# Patient Record
Sex: Female | Born: 1940
Health system: Southern US, Community
[De-identification: ages and names within clinical notes are randomized; demographics above are authoritative.]

## PROBLEM LIST (undated history)

## (undated) DIAGNOSIS — E119 Type 2 diabetes mellitus without complications: Secondary | ICD-10-CM

## (undated) DIAGNOSIS — I10 Essential (primary) hypertension: Secondary | ICD-10-CM

## (undated) DIAGNOSIS — N183 Chronic kidney disease, stage 3 unspecified: Secondary | ICD-10-CM

## (undated) DIAGNOSIS — R51 Headache: Secondary | ICD-10-CM

## (undated) DIAGNOSIS — G4730 Sleep apnea, unspecified: Secondary | ICD-10-CM

## (undated) DIAGNOSIS — Z8489 Family history of other specified conditions: Secondary | ICD-10-CM

## (undated) DIAGNOSIS — R519 Headache, unspecified: Secondary | ICD-10-CM

## (undated) HISTORY — PX: CLOSED REDUCTION SHOULDER DISLOCATION: SUR242

## (undated) HISTORY — PX: MULTIPLE TOOTH EXTRACTIONS: SHX2053

## (undated) HISTORY — PX: EYE SURGERY: SHX253

---

## 2006-06-05 ENCOUNTER — Other Ambulatory Visit: Admission: RE | Admit: 2006-06-05 | Discharge: 2006-06-05 | Payer: Self-pay | Admitting: Family Medicine

## 2011-12-12 DIAGNOSIS — E7800 Pure hypercholesterolemia, unspecified: Secondary | ICD-10-CM | POA: Diagnosis not present

## 2012-02-29 DIAGNOSIS — I10 Essential (primary) hypertension: Secondary | ICD-10-CM | POA: Diagnosis not present

## 2012-02-29 DIAGNOSIS — E1129 Type 2 diabetes mellitus with other diabetic kidney complication: Secondary | ICD-10-CM | POA: Diagnosis not present

## 2012-02-29 DIAGNOSIS — Z1331 Encounter for screening for depression: Secondary | ICD-10-CM | POA: Diagnosis not present

## 2012-02-29 DIAGNOSIS — E7800 Pure hypercholesterolemia, unspecified: Secondary | ICD-10-CM | POA: Diagnosis not present

## 2012-10-16 DIAGNOSIS — I10 Essential (primary) hypertension: Secondary | ICD-10-CM | POA: Diagnosis not present

## 2012-10-16 DIAGNOSIS — E7800 Pure hypercholesterolemia, unspecified: Secondary | ICD-10-CM | POA: Diagnosis not present

## 2012-10-16 DIAGNOSIS — E1129 Type 2 diabetes mellitus with other diabetic kidney complication: Secondary | ICD-10-CM | POA: Diagnosis not present

## 2013-04-09 DIAGNOSIS — E1129 Type 2 diabetes mellitus with other diabetic kidney complication: Secondary | ICD-10-CM | POA: Diagnosis not present

## 2013-04-09 DIAGNOSIS — E7800 Pure hypercholesterolemia, unspecified: Secondary | ICD-10-CM | POA: Diagnosis not present

## 2013-04-09 DIAGNOSIS — Z1331 Encounter for screening for depression: Secondary | ICD-10-CM | POA: Diagnosis not present

## 2013-04-09 DIAGNOSIS — I10 Essential (primary) hypertension: Secondary | ICD-10-CM | POA: Diagnosis not present

## 2013-10-07 DIAGNOSIS — H18419 Arcus senilis, unspecified eye: Secondary | ICD-10-CM | POA: Diagnosis not present

## 2013-10-07 DIAGNOSIS — H25019 Cortical age-related cataract, unspecified eye: Secondary | ICD-10-CM | POA: Diagnosis not present

## 2013-10-07 DIAGNOSIS — H2510 Age-related nuclear cataract, unspecified eye: Secondary | ICD-10-CM | POA: Diagnosis not present

## 2013-10-07 DIAGNOSIS — H02839 Dermatochalasis of unspecified eye, unspecified eyelid: Secondary | ICD-10-CM | POA: Diagnosis not present

## 2013-10-07 DIAGNOSIS — E119 Type 2 diabetes mellitus without complications: Secondary | ICD-10-CM | POA: Diagnosis not present

## 2013-10-07 DIAGNOSIS — H25049 Posterior subcapsular polar age-related cataract, unspecified eye: Secondary | ICD-10-CM | POA: Diagnosis not present

## 2013-11-13 DIAGNOSIS — E1129 Type 2 diabetes mellitus with other diabetic kidney complication: Secondary | ICD-10-CM | POA: Diagnosis not present

## 2013-11-13 DIAGNOSIS — H269 Unspecified cataract: Secondary | ICD-10-CM | POA: Diagnosis not present

## 2013-11-13 DIAGNOSIS — I10 Essential (primary) hypertension: Secondary | ICD-10-CM | POA: Diagnosis not present

## 2013-11-13 DIAGNOSIS — E7800 Pure hypercholesterolemia, unspecified: Secondary | ICD-10-CM | POA: Diagnosis not present

## 2013-12-09 DIAGNOSIS — H269 Unspecified cataract: Secondary | ICD-10-CM | POA: Diagnosis not present

## 2013-12-09 DIAGNOSIS — Z961 Presence of intraocular lens: Secondary | ICD-10-CM | POA: Diagnosis not present

## 2013-12-09 DIAGNOSIS — H2510 Age-related nuclear cataract, unspecified eye: Secondary | ICD-10-CM | POA: Diagnosis not present

## 2013-12-10 DIAGNOSIS — H2510 Age-related nuclear cataract, unspecified eye: Secondary | ICD-10-CM | POA: Diagnosis not present

## 2013-12-23 DIAGNOSIS — H2510 Age-related nuclear cataract, unspecified eye: Secondary | ICD-10-CM | POA: Diagnosis not present

## 2013-12-23 DIAGNOSIS — H269 Unspecified cataract: Secondary | ICD-10-CM | POA: Diagnosis not present

## 2013-12-23 DIAGNOSIS — H52229 Regular astigmatism, unspecified eye: Secondary | ICD-10-CM | POA: Diagnosis not present

## 2013-12-23 DIAGNOSIS — H5210 Myopia, unspecified eye: Secondary | ICD-10-CM | POA: Diagnosis not present

## 2013-12-23 DIAGNOSIS — Z961 Presence of intraocular lens: Secondary | ICD-10-CM | POA: Diagnosis not present

## 2014-02-17 DIAGNOSIS — H20029 Recurrent acute iridocyclitis, unspecified eye: Secondary | ICD-10-CM | POA: Diagnosis not present

## 2014-03-07 DIAGNOSIS — E7800 Pure hypercholesterolemia, unspecified: Secondary | ICD-10-CM | POA: Diagnosis not present

## 2014-05-12 DIAGNOSIS — H02059 Trichiasis without entropian unspecified eye, unspecified eyelid: Secondary | ICD-10-CM | POA: Diagnosis not present

## 2014-05-14 DIAGNOSIS — Z1331 Encounter for screening for depression: Secondary | ICD-10-CM | POA: Diagnosis not present

## 2014-05-14 DIAGNOSIS — N183 Chronic kidney disease, stage 3 unspecified: Secondary | ICD-10-CM | POA: Diagnosis not present

## 2014-05-14 DIAGNOSIS — E1129 Type 2 diabetes mellitus with other diabetic kidney complication: Secondary | ICD-10-CM | POA: Diagnosis not present

## 2014-05-14 DIAGNOSIS — I129 Hypertensive chronic kidney disease with stage 1 through stage 4 chronic kidney disease, or unspecified chronic kidney disease: Secondary | ICD-10-CM | POA: Diagnosis not present

## 2014-05-14 DIAGNOSIS — E7800 Pure hypercholesterolemia, unspecified: Secondary | ICD-10-CM | POA: Diagnosis not present

## 2014-11-14 DIAGNOSIS — I129 Hypertensive chronic kidney disease with stage 1 through stage 4 chronic kidney disease, or unspecified chronic kidney disease: Secondary | ICD-10-CM | POA: Diagnosis not present

## 2014-11-14 DIAGNOSIS — E780 Pure hypercholesterolemia: Secondary | ICD-10-CM | POA: Diagnosis not present

## 2014-11-14 DIAGNOSIS — N183 Chronic kidney disease, stage 3 (moderate): Secondary | ICD-10-CM | POA: Diagnosis not present

## 2014-11-14 DIAGNOSIS — E1121 Type 2 diabetes mellitus with diabetic nephropathy: Secondary | ICD-10-CM | POA: Diagnosis not present

## 2015-05-18 DIAGNOSIS — I10 Essential (primary) hypertension: Secondary | ICD-10-CM | POA: Diagnosis not present

## 2015-05-18 DIAGNOSIS — E1165 Type 2 diabetes mellitus with hyperglycemia: Secondary | ICD-10-CM | POA: Diagnosis not present

## 2015-05-18 DIAGNOSIS — Z1389 Encounter for screening for other disorder: Secondary | ICD-10-CM | POA: Diagnosis not present

## 2015-05-18 DIAGNOSIS — N183 Chronic kidney disease, stage 3 (moderate): Secondary | ICD-10-CM | POA: Diagnosis not present

## 2015-05-18 DIAGNOSIS — E1121 Type 2 diabetes mellitus with diabetic nephropathy: Secondary | ICD-10-CM | POA: Diagnosis not present

## 2015-05-18 DIAGNOSIS — E780 Pure hypercholesterolemia: Secondary | ICD-10-CM | POA: Diagnosis not present

## 2015-06-30 DIAGNOSIS — M79604 Pain in right leg: Secondary | ICD-10-CM | POA: Diagnosis not present

## 2015-08-18 DIAGNOSIS — N183 Chronic kidney disease, stage 3 (moderate): Secondary | ICD-10-CM | POA: Diagnosis not present

## 2015-08-18 DIAGNOSIS — E1121 Type 2 diabetes mellitus with diabetic nephropathy: Secondary | ICD-10-CM | POA: Diagnosis not present

## 2015-08-18 DIAGNOSIS — E780 Pure hypercholesterolemia: Secondary | ICD-10-CM | POA: Diagnosis not present

## 2015-08-18 DIAGNOSIS — I10 Essential (primary) hypertension: Secondary | ICD-10-CM | POA: Diagnosis not present

## 2015-11-17 ENCOUNTER — Emergency Department (HOSPITAL_COMMUNITY): Payer: Medicare Other

## 2015-11-17 ENCOUNTER — Ambulatory Visit
Admission: RE | Admit: 2015-11-17 | Discharge: 2015-11-17 | Disposition: A | Discharge status: Home / Self Care | Payer: Medicare Other | Source: Ambulatory Visit | Attending: Family Medicine | Admitting: Family Medicine

## 2015-11-17 ENCOUNTER — Encounter (HOSPITAL_COMMUNITY): Payer: Self-pay | Admitting: Emergency Medicine

## 2015-11-17 ENCOUNTER — Other Ambulatory Visit: Payer: Self-pay | Admitting: Family Medicine

## 2015-11-17 ENCOUNTER — Emergency Department (HOSPITAL_COMMUNITY)
Admission: EM | Admit: 2015-11-17 | Discharge: 2015-11-17 | Disposition: A | Discharge status: Home / Self Care | Payer: Medicare Other | Attending: Emergency Medicine | Admitting: Emergency Medicine

## 2015-11-17 DIAGNOSIS — M79601 Pain in right arm: Secondary | ICD-10-CM

## 2015-11-17 DIAGNOSIS — Z79899 Other long term (current) drug therapy: Secondary | ICD-10-CM | POA: Diagnosis not present

## 2015-11-17 DIAGNOSIS — Y92520 Airport as the place of occurrence of the external cause: Secondary | ICD-10-CM | POA: Insufficient documentation

## 2015-11-17 DIAGNOSIS — W010XXA Fall on same level from slipping, tripping and stumbling without subsequent striking against object, initial encounter: Secondary | ICD-10-CM | POA: Insufficient documentation

## 2015-11-17 DIAGNOSIS — S43004A Unspecified dislocation of right shoulder joint, initial encounter: Secondary | ICD-10-CM | POA: Diagnosis not present

## 2015-11-17 DIAGNOSIS — I10 Essential (primary) hypertension: Secondary | ICD-10-CM | POA: Diagnosis not present

## 2015-11-17 DIAGNOSIS — Y999 Unspecified external cause status: Secondary | ICD-10-CM | POA: Insufficient documentation

## 2015-11-17 DIAGNOSIS — S4991XA Unspecified injury of right shoulder and upper arm, initial encounter: Secondary | ICD-10-CM | POA: Diagnosis present

## 2015-11-17 DIAGNOSIS — E119 Type 2 diabetes mellitus without complications: Secondary | ICD-10-CM | POA: Diagnosis not present

## 2015-11-17 DIAGNOSIS — Z7982 Long term (current) use of aspirin: Secondary | ICD-10-CM | POA: Insufficient documentation

## 2015-11-17 DIAGNOSIS — Y9389 Activity, other specified: Secondary | ICD-10-CM | POA: Diagnosis not present

## 2015-11-17 HISTORY — DX: Type 2 diabetes mellitus without complications: E11.9

## 2015-11-17 HISTORY — DX: Essential (primary) hypertension: I10

## 2015-11-17 MED ORDER — PROPOFOL 10 MG/ML IV BOLUS
INTRAVENOUS | Status: AC | PRN
  Administered 2015-11-17: 50 mg via INTRAVENOUS

## 2015-11-17 MED ORDER — HYDROCODONE-ACETAMINOPHEN 5-325 MG PO TABS: 2.0000 | ORAL_TABLET | ORAL | Status: DC | PRN

## 2015-11-17 MED ORDER — PROPOFOL 10 MG/ML IV BOLUS
2.0000 mg/kg | Freq: Once | INTRAVENOUS | Status: DC
Start: 2015-11-17 — End: 2015-11-18
  Filled 2015-11-17: qty 20

## 2015-11-17 MED ORDER — PROPOFOL 10 MG/ML IV BOLUS: 2.0000 mg/kg | Freq: Once | INTRAVENOUS | Status: DC

## 2015-11-17 NOTE — ED Notes (Signed)
Pt with fall today and right shoulder dislocation per xray she had today; CMS intact

## 2015-11-17 NOTE — ED Provider Notes (Signed)
CSN: [id number]     Arrival date & time 11/17/15  1713 History   First MD Initiated Contact with Patient 11/17/15 1750     Chief Complaint  Patient presents with  . Shoulder Injury     (Consider location/radiation/quality/duration/timing/severity/associated sxs/prior Treatment) Patient is a 74 y.o. female presenting with shoulder pain. The history is provided by the patient.  Shoulder Pain Location:  Shoulder Injury: yes   Mechanism of injury: fall   Fall:    Fall occurred:  Walking and standing   Impact surface:  Hard floor   Point of impact:  Outstretched arms   Entrapped after fall: no   Shoulder location:  R shoulder Pain details:    Quality:  Aching   Radiates to:  Does not radiate   Severity:  Severe   Onset quality:  Gradual   Timing:  Constant   Progression:  Unchanged Chronicity:  New Handedness:  Right-handed Dislocation: yes   Prior injury to area:  No Worsened by:  Movement and bearing weight Ineffective treatments:  None tried Associated symptoms: decreased range of motion   Associated symptoms: no back pain, no fatigue, no fever, no muscle weakness, no neck pain, no numbness, no stiffness, no swelling and no tingling   Risk factors: no concern for non-accidental trauma and no recent illness     Past Medical History  Diagnosis Date  . Hypertension   . Diabetes mellitus without complication (HCC)    History reviewed. No pertinent past surgical history. History reviewed. No pertinent family history. Social History  Substance Use Topics  . Smoking status: Never Smoker   . Smokeless tobacco: None  . Alcohol Use: No   OB History    No data available     Review of Systems  Constitutional: Negative for fever, appetite change and fatigue.  HENT: Negative for facial swelling.   Eyes: Negative for pain.  Respiratory: Negative for chest tightness and shortness of breath.   Gastrointestinal: Negative for nausea and vomiting.  Genitourinary: Negative for  dysuria.  Musculoskeletal: Positive for arthralgias. Negative for myalgias, back pain, stiffness, neck pain and neck stiffness.  Skin: Negative for rash and wound.  Neurological: Negative for dizziness, tremors, seizures, syncope, facial asymmetry, speech difficulty, weakness, numbness and headaches.      Allergies  Review of patient's allergies indicates no known allergies.  Home Medications   Prior to Admission medications   Medication Sig Start Date End Date Taking? Authorizing Provider  amLODipine (NORVASC) 10 MG tablet Take 10 mg by mouth daily. 09/21/15  Yes Historical Provider, MD  aspirin EC 81 MG tablet Take 81 mg by mouth every other day.   Yes Historical Provider, MD  atorvastatin (LIPITOR) 20 MG tablet Take 20 mg by mouth daily. 10/26/15  Yes Historical Provider, MD  metFORMIN (GLUCOPHAGE) 500 MG tablet Take 500 mg by mouth daily. 08/18/15  Yes Historical Provider, MD  olmesartan-hydrochlorothiazide (BENICAR HCT) 40-25 MG tablet Take 1 tablet by mouth daily. 10/26/15  Yes Historical Provider, MD  HYDROcodone-acetaminophen (NORCO/VICODIN) 5-325 MG tablet Take 2 tablets by mouth every 4 (four) hours as needed. 11/17/15   Stacy Gardner, MD   BP 143/68 mmHg  Pulse 70  Temp(Src) 97.9 F (36.6 C) (Oral)  Resp 16  SpO2 100% Physical Exam  Constitutional: She is oriented to person, place, and time. She appears well-developed and well-nourished.  HENT:  Head: Normocephalic and atraumatic.  Eyes: Conjunctivae and EOM are normal. Pupils are equal, round, and reactive to light.  Neck: Normal range of motion. Neck supple.  Cardiovascular: Normal rate, regular rhythm and normal heart sounds.  Exam reveals no gallop and no friction rub.   No murmur heard. Pulmonary/Chest: Effort normal and breath sounds normal. No respiratory distress. She has no wheezes. She has no rales. She exhibits no tenderness.  Abdominal: Bowel sounds are normal. She exhibits no distension. There is no  tenderness. There is no rebound.  Musculoskeletal:       Right shoulder: She exhibits decreased range of motion, tenderness, bony tenderness, deformity and pain. She exhibits no swelling and no effusion.       Right upper arm: She exhibits no tenderness, no bony tenderness, no swelling, no edema and no deformity.  Neurological: She is alert and oriented to person, place, and time. No cranial nerve deficit or sensory deficit. GCS eye subscore is 4. GCS verbal subscore is 5. GCS motor subscore is 6.  Skin: Skin is warm and dry.    ED Course  .Sedation Date/Time: 11/17/2015 8:57 PM Performed by: Stacy GardnerSEYMORE, Darious Rehman Authorized by: Stacy GardnerSEYMORE, Pierra Skora  Consent:    Consent obtained:  Written   Consent given by:  Patient   Risks discussed:  Allergic reaction, inadequate sedation, prolonged hypoxia resulting in organ damage, prolonged sedation necessitating reversal, respiratory compromise necessitating ventilatory assistance and intubation, vomiting, nausea and dysrhythmia   Alternatives discussed:  Analgesia without sedation and regional anesthesia Indications:    Sedation purpose:  Dislocation reduction   Procedure necessitating sedation performed by:  Physician performing sedation   Intended level of sedation:  Moderate (conscious sedation) Pre-sedation assessment:    Time since last food or drink:  Yesterday   ASA classification: class 2 - patient with mild systemic disease     Neck mobility: normal     Mouth opening:  2 finger widths   Thyromental distance:  2 finger widths   Mallampati score:  II - soft palate, uvula, fauces visible   Pre-sedation assessments completed and reviewed: airway patency, cardiovascular function, hydration status, mental status, nausea/vomiting, pain level, respiratory function and temperature   Immediate pre-procedure details:    Reassessment: Patient reassessed immediately prior to procedure     Reviewed: vital signs and NPO status     Verified: bag valve mask  available, emergency equipment available, IV patency confirmed, oxygen available and suction available   Procedure details (see MAR for exact dosages):    Preoxygenation:  Nasal cannula   Sedation:  Propofol   Intra-procedure monitoring:  Blood pressure monitoring, cardiac monitor, frequent vital sign checks and continuous pulse oximetry   Intra-procedure events: none     Intra-procedure management:  Supplemental oxygen Post-procedure details:    Attendance: Constant attendance by certified staff until patient recovered     Recovery: Patient returned to pre-procedure baseline     Post-sedation assessments completed and reviewed: airway patency, cardiovascular function, hydration status, mental status, nausea/vomiting, pain level and respiratory function     Patient is stable for discharge or admission: Yes     Patient tolerance:  Tolerated well, no immediate complications Reduction of dislocation Date/Time: 11/17/2015 9:00 PM Performed by: Stacy GardnerSEYMORE, Qiara Minetti Authorized by: Stacy GardnerSEYMORE, Jhalil Silvera Consent: Verbal consent obtained. Risks and benefits: risks, benefits and alternatives were discussed Consent given by: patient Patient understanding: patient states understanding of the procedure being performed Patient consent: the patient's understanding of the procedure matches consent given Procedure consent: procedure consent matches procedure scheduled Relevant documents: relevant documents present and verified Test results: test results available and properly labeled  Site marked: the operative site was marked Imaging studies: imaging studies available Required items: required blood products, implants, devices, and special equipment available Patient identity confirmed: verbally with patient and arm band Time out: Immediately prior to procedure a "time out" was called to verify the correct patient, procedure, equipment, support staff and site/side marked as required. Patient sedated: yes Sedation  type: moderate (conscious) sedation Sedatives: propofol Vitals: Vital signs were monitored during sedation. Patient tolerance: Patient tolerated the procedure well with no immediate complications   (including critical care time) Labs Review Labs Reviewed - No data to display  Imaging Review Dg Shoulder Right  11/17/2015  CLINICAL DATA:  Recent fall this morning with arm pain and decreased range of motion, initial encounter EXAM: RIGHT SHOULDER - 2+ VIEW COMPARISON:  None. FINDINGS: There is an anterior inferior dislocation of the right humeral head with respect to the glenoid labrum. No definitive fracture is seen at this time. IMPRESSION: Dislocation of the right shoulder. Electronically Signed   By: Alcide Clever M.D.   On: 11/17/2015 15:55   Dg Shoulder Right Port  11/17/2015  CLINICAL DATA:  Reduction of shoulder dislocation. EXAM: PORTABLE RIGHT SHOULDER - 2+ VIEW COMPARISON:  Earlier films, same date. FINDINGS: The humeral head is relocated in the glenoid fossa. No fractures are identified. The right lung is clear. IMPRESSION: Reduction of right shoulder dislocation. Electronically Signed   By: Rudie Meyer M.D.   On: 11/17/2015 20:06   Dg Humerus Right  11/17/2015  CLINICAL DATA:  74 year old female who fell at 0900 hours today with pain. Initial encounter. EXAM: RIGHT HUMERUS - 2+ VIEW COMPARISON:  Right shoulder series today reported separately. FINDINGS: Anterior, subcoracoid right glenohumeral joint dislocation again noted. There may be mild impaction of the right humeral head on the glenoid. The right humerus otherwise appears intact. Alignment at the right elbow is grossly maintained. Bone mineralization is within normal limits. Negative visualized right ribs and lung parenchyma. IMPRESSION: Right anterior glenohumeral joint dislocation re - identified. 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 Electronically Signed   By: Odessa Fleming M.D.   On: 11/17/2015 16:07    I have personally reviewed and evaluated these images and lab results as part of my medical decision-making.   EKG Interpretation None      MDM   Final diagnoses:  Shoulder dislocation, right, initial encounter     74 year old African-American female with past medical history of hypertension, diabetes presents in setting of right shoulder dislocation. She reports she was [place] from airport earlier today when she tripped and fell onto her right outstretched arm. She reports she had immediate discomfort in her shoulder for which she went to her primary care doctor. Primary care doctor had patient obtain x-rays which revealed right shoulder dislocation without fracture. Patient was advised to come to the emergency department for further evaluation.  On arrival patient was hemodynamically stable and continued to complain of right shoulder pain. Patient was neurovascularly intact in right upper extremity. I personally reviewed imaging which revealed right shoulder dislocation. Patient had shoulder dislocation reduction performed under conscious sedation as noted in procedure note above. Patient tolerated procedure well and continued to be neurovascularly intact on reexamination after reduction. Repeat x-ray demonstrated adequate reduction. Patient placed in sling.  Will discharge home with plan to follow up with orthopedics as outpatient within 1 week for reevaluation. Patient given short course of pain medication and strict return precautions. Patient stable at time  of discharge and in agreement with plan.  Attending has seen and evaluated patient and Dr. Denton Lank is in agreement with plan.    Stacy Gardner, MD 11/18/15 2536  Cathren Laine, MD 11/23/15 [phone]

## 2015-11-17 NOTE — Discharge Instructions (Signed)
Shoulder Dislocation Your shoulder joint is made up of 3 bones:  The upper arm bone (humerus).  The shoulder blade (scapula).  The collarbone (clavicle). A shoulder dislocation happens when your upper arm bone moves out of its normal place in your shoulder joint. HOME CARE If You Have a Splint or Sling:  Wear it as told by your doctor.  Take it off only as told by your doctor.  Loosen it if:  Your fingers become numb and tingly.  Your fingers turn cold and blue.  Keep it clean and dry. Bathing  Do not take baths, swim, or use a hot tub until your doctor says you can. Ask your doctor if you can take showers. You may only be allowed to take sponge baths.  If your doctor says taking baths or showers is okay, cover your splint or sling with a plastic bag. Do not let the splint or sling get wet. Managing Pain, Stiffness, and Swelling  If told, put ice on the injured area.  Put ice in a plastic bag.  Place a towel between your skin and the bag.  Leave the ice on for 20 minutes, 2-3 times per day.  Move your fingers often to avoid stiffness and to lessen swelling.  Raise (elevate) the injured area above the level of your heart while you are sitting or lying down. Driving  Do not drive while you are wearing a splint or sling on a hand that you use for driving.  Do not drive or operate heavy machinery while taking pain medicine. Activity  Return to your normal activities as told by your doctor. Ask your doctor what activities are safe for you.  Do range-of-motion exercises only as told by your doctor.  Exercise your hand by squeezing a soft ball. This keeps your hand and wrist from getting stiff and swollen. General Instructions  Take over-the-counter and prescription medicines only as told by your doctor.  Do not use any tobacco products, including cigarettes, chewing tobacco, or e-cigarettes. Tobacco can slow down healing. If you need help quitting, ask your  doctor.  Keep all follow-up visits as told by your doctor. This is important. GET HELP IF:  Your splint or sling gets damaged. GET HELP RIGHT AWAY IF:  Your pain gets worse instead of better.  You lose feeling in your arm or hand.  Your arm or hand turns white and cold.   This information is not intended to replace advice given to you by your health care provider. Make sure you discuss any questions you have with your health care provider.   Document Released: 02/06/2012 Document Revised: 08/05/2015 Document Reviewed: 03/09/2015 Elsevier Interactive Patient Education 2016 [employer].  How to Use a Shoulder Immobilizer A shoulder immobilizer is a device that you may have to wear after a shoulder injury or surgery. This device keeps your arm from moving. This prevents additional pain or injury. It also supports your arm next to your body as your shoulder heals. You may need to wear a shoulder immobilizer to treat a broken bone (fracture) in your shoulder. You may also need to wear one if you have an injury that moves your shoulder out of position (dislocation). There are different types of shoulder immobilizers. The one that you get depends on your injury. RISKS AND COMPLICATIONS Wearing a shoulder immobilizer in the wrong way can let your injured shoulder move around too much. This may delay healing and make your pain and swelling worse. HOW TO USE  YOUR SHOULDER IMMOBILIZER  The part of the immobilizer that goes around your neck (sling) should support your upper arm, with your elbow bent and your lower arm and hand across your chest.  Make sure that your elbow:  Is snug against the back pocket of the sling.  Does not move away from your body.  The strap of the immobilizer should go over your shoulder and support your arm and hand. Your hand should be slightly higher than your elbow. It should not hang loosely over the edge of the sling.  If the long strap has a pad, place it  where it is most comfortable on your neck.  Carefully follow your health care provider's instructions for wearing your shoulder immobilizer. Your health care provider may want you to:  Loosen your immobilizer to straighten your elbow and move your wrist and fingers. You may have to do this several times each day. Ask your health care provider when you should do this and how often.  Remove your immobilizer once every day to shower, but limit the movement in your injured arm. Before putting the immobilizer back on, use a towel to dry the area under your arm completely.  Remove your immobilizer to do shoulder exercises at home as directed by your health care provider.  Wear your immobilizer while you sleep. You may sleep more comfortably if you have your upper body raised on pillows. SEEK MEDICAL CARE IF:  Your immobilizer is not supporting your arm properly.  Your immobilizer gets damaged.  You have worsening pain or swelling in your shoulder, arm, or hand.  Your shoulder, arm, or hand changes color or temperature.  You lose feeling in your shoulder, arm, or hand.   This information is not intended to replace advice given to you by your health care provider. Make sure you discuss any questions you have with your health care provider.   Document Released: 12/22/2004 Document Revised: 03/31/2015 Document Reviewed: 10/22/2014 Elsevier Interactive Patient Education 2016 [employer] Inc[employer]  Shoulder Dislocation A shoulder dislocation happens when the upper arm bone (humerus) moves out of the shoulder joint. The shoulder joint is the part of the shoulder where the humerus, shoulder blade (scapula), and collarbone (clavicle) meet. CAUSES This condition is often caused by:  A fall.  A hit to the shoulder.  A forceful movement of the shoulder. RISK FACTORS This condition is more likely to develop in people who play sports. SYMPTOMS Symptoms of this condition include:  Deformity of the  shoulder.  Intense pain.  Inability to move the shoulder.  Numbness, weakness, or tingling in your neck or down your arm.  Bruising or swelling around your shoulder. DIAGNOSIS This condition is diagnosed with a physical exam. After the exam, tests may be done to check for related problems. Tests that may be done include:  X-ray. This may be done to check for broken bones.  MRI. This may be done to check for damage to the tissues around the shoulder.  Electromyogram. This may be done to check for nerve damage. TREATMENT This condition is treated with a procedure to place the humerus back in the joint. This procedure is called a reduction. There are two types of reduction:  Closed reduction. In this procedure, the humerus is placed back in the joint without surgery. The health care provider uses his or her hands to guide the bone back into place.  Open reduction. In this procedure, the humerus is placed back in the joint with surgery. An  open reduction may be recommended if:  You have a weak shoulder joint or weak ligaments.  You have had more than one shoulder dislocation.  The nerves or blood vessels around your shoulder have been damaged. After the humerus is placed back into the joint, your arm will be placed in a splint or sling to prevent it from moving. You will need to wear the splint or sling until your shoulder heals. When the splint or sling is removed, you may have physical therapy to help improve the range of motion in your shoulder joint. HOME CARE INSTRUCTIONS If You Have a Splint or Sling:  Wear it as told by your health care provider. Remove it only as told by your health care provider.  Loosen it if your fingers become numb and tingle, or if they turn cold and blue.  Keep it clean and dry. Bathing  Do not take baths, swim, or use a hot tub until your health care provider approves. Ask your health care provider if you can take showers. You may only be allowed to  take sponge baths for bathing.  If your health care provider approves bathing and showering, cover your splint or sling with a watertight plastic bag to protect it from water. Do not let the splint or sling get wet. Managing Pain, Stiffness, and Swelling  If directed, apply ice to the injured area.  Put ice in a plastic bag.  Place a towel between your skin and the bag.  Leave the ice on for 20 minutes, 2-3 times per day.  Move your fingers often to avoid stiffness and to decrease swelling.  Raise (elevate) the injured area above the level of your heart while you are sitting or lying down. Driving  Do not drive while wearing a splint or sling on a hand that you use for driving.  Do not drive or operate heavy machinery while taking pain medicine. Activity  Return to your normal activities as told by your health care provider. Ask your health care provider what activities are safe for you.  Perform range-of-motion exercises only as told by your health care provider.  Exercise your hand by squeezing a soft ball. This helps to decrease stiffness and swelling in your hand and wrist. General Instructions  Take over-the-counter and prescription medicines only as told by your health care provider.  Do not use any tobacco products, including cigarettes, chewing tobacco, or e-cigarettes. Tobacco can delay bone and tissue healing. If you need help quitting, ask your health care provider.  Keep all follow-up visits as told by your health care provider. This is important. SEEK MEDICAL CARE IF:  Your splint or sling gets damaged. SEEK IMMEDIATE MEDICAL CARE IF:  Your pain gets worse rather than better.  You lose feeling in your arm or hand.  Your arm or hand becomes white and cold.   This information is not intended to replace advice given to you by your health care provider. Make sure you discuss any questions you have with your health care provider.   Document Released: 08/09/2001  Document Revised: 08/05/2015 Document Reviewed: 03/09/2015 Elsevier Interactive Patient Education [employer]2016 Elsevier Inc.

## 2015-11-17 NOTE — ED Notes (Signed)
Patient verbalized understanding of discharge instructions and follow-up information and denies any further needs or questions at this time. VS stable. Ambulatory with steady gait. Assisted to ED entrance in wheelchair.

## 2015-11-26 DIAGNOSIS — S43014A Anterior dislocation of right humerus, initial encounter: Secondary | ICD-10-CM | POA: Diagnosis not present

## 2015-12-17 DIAGNOSIS — S43014S Anterior dislocation of right humerus, sequela: Secondary | ICD-10-CM | POA: Diagnosis not present

## 2015-12-18 ENCOUNTER — Other Ambulatory Visit: Payer: Self-pay | Admitting: Sports Medicine

## 2015-12-18 DIAGNOSIS — M25511 Pain in right shoulder: Secondary | ICD-10-CM

## 2016-01-04 ENCOUNTER — Ambulatory Visit
Admission: RE | Admit: 2016-01-04 | Discharge: 2016-01-04 | Disposition: A | Discharge status: Home / Self Care | Payer: Medicare Other | Source: Ambulatory Visit | Attending: Sports Medicine | Admitting: Sports Medicine

## 2016-01-04 DIAGNOSIS — M25511 Pain in right shoulder: Secondary | ICD-10-CM

## 2016-01-04 DIAGNOSIS — S46011A Strain of muscle(s) and tendon(s) of the rotator cuff of right shoulder, initial encounter: Secondary | ICD-10-CM | POA: Diagnosis not present

## 2016-01-07 DIAGNOSIS — S43014D Anterior dislocation of right humerus, subsequent encounter: Secondary | ICD-10-CM | POA: Diagnosis not present

## 2016-01-08 ENCOUNTER — Other Ambulatory Visit (HOSPITAL_COMMUNITY): Payer: Self-pay | Admitting: Orthopedic Surgery

## 2016-01-08 ENCOUNTER — Other Ambulatory Visit: Payer: Self-pay | Admitting: Orthopedic Surgery

## 2016-01-08 DIAGNOSIS — M19011 Primary osteoarthritis, right shoulder: Secondary | ICD-10-CM

## 2016-01-11 ENCOUNTER — Encounter (INDEPENDENT_AMBULATORY_CARE_PROVIDER_SITE_OTHER): Payer: Self-pay

## 2016-01-11 ENCOUNTER — Other Ambulatory Visit (HOSPITAL_COMMUNITY): Payer: Self-pay | Admitting: Orthopedic Surgery

## 2016-01-11 ENCOUNTER — Ambulatory Visit
Admission: RE | Admit: 2016-01-11 | Discharge: 2016-01-11 | Disposition: A | Discharge status: Home / Self Care | Payer: Medicare Other | Source: Ambulatory Visit | Attending: Orthopedic Surgery | Admitting: Orthopedic Surgery

## 2016-01-11 DIAGNOSIS — M19011 Primary osteoarthritis, right shoulder: Secondary | ICD-10-CM

## 2016-01-14 ENCOUNTER — Other Ambulatory Visit: Payer: Self-pay

## 2016-01-14 ENCOUNTER — Encounter (HOSPITAL_COMMUNITY)
Admission: RE | Admit: 2016-01-14 | Discharge: 2016-01-14 | Disposition: A | Discharge status: Home / Self Care | Payer: Medicare Other | Source: Ambulatory Visit | Attending: Neurology | Admitting: Neurology

## 2016-01-14 ENCOUNTER — Ambulatory Visit (HOSPITAL_COMMUNITY)
Admission: RE | Admit: 2016-01-14 | Discharge: 2016-01-14 | Disposition: A | Discharge status: Home / Self Care | Payer: Medicare Other | Source: Ambulatory Visit | Attending: Orthopedic Surgery | Admitting: Orthopedic Surgery

## 2016-01-14 ENCOUNTER — Other Ambulatory Visit (HOSPITAL_COMMUNITY): Payer: Self-pay | Admitting: *Deleted

## 2016-01-14 ENCOUNTER — Encounter (HOSPITAL_COMMUNITY): Payer: Self-pay

## 2016-01-14 DIAGNOSIS — Z7982 Long term (current) use of aspirin: Secondary | ICD-10-CM | POA: Insufficient documentation

## 2016-01-14 DIAGNOSIS — E119 Type 2 diabetes mellitus without complications: Secondary | ICD-10-CM | POA: Diagnosis not present

## 2016-01-14 DIAGNOSIS — Z01812 Encounter for preprocedural laboratory examination: Secondary | ICD-10-CM | POA: Insufficient documentation

## 2016-01-14 DIAGNOSIS — Z01818 Encounter for other preprocedural examination: Secondary | ICD-10-CM | POA: Insufficient documentation

## 2016-01-14 DIAGNOSIS — Z79899 Other long term (current) drug therapy: Secondary | ICD-10-CM | POA: Diagnosis not present

## 2016-01-14 DIAGNOSIS — R9431 Abnormal electrocardiogram [ECG] [EKG]: Secondary | ICD-10-CM | POA: Diagnosis not present

## 2016-01-14 DIAGNOSIS — Z7984 Long term (current) use of oral hypoglycemic drugs: Secondary | ICD-10-CM | POA: Diagnosis not present

## 2016-01-14 DIAGNOSIS — M47894 Other spondylosis, thoracic region: Secondary | ICD-10-CM | POA: Insufficient documentation

## 2016-01-14 DIAGNOSIS — Z01810 Encounter for preprocedural cardiovascular examination: Secondary | ICD-10-CM | POA: Insufficient documentation

## 2016-01-14 HISTORY — DX: Headache, unspecified: R51.9

## 2016-01-14 HISTORY — DX: Chronic kidney disease, stage 3 unspecified: N18.30

## 2016-01-14 HISTORY — DX: Headache: R51

## 2016-01-14 HISTORY — DX: Chronic kidney disease, stage 3 (moderate): N18.3

## 2016-01-14 HISTORY — DX: Family history of other specified conditions: Z84.89

## 2016-01-14 LAB — URINALYSIS, ROUTINE W REFLEX MICROSCOPIC
BILIRUBIN URINE: NEGATIVE
Glucose, UA: NEGATIVE mg/dL
KETONES UR: NEGATIVE mg/dL
Nitrite: NEGATIVE
PROTEIN: NEGATIVE mg/dL
Specific Gravity, Urine: 1.02 (ref 1.005–1.030)
pH: 6 (ref 5.0–8.0)

## 2016-01-14 LAB — BASIC METABOLIC PANEL
Anion gap: 12 (ref 5–15)
BUN: 37 mg/dL — ABNORMAL HIGH (ref 6–20)
CO2: 24 mmol/L (ref 22–32)
CREATININE: 1.62 mg/dL — AB (ref 0.44–1.00)
Calcium: 10.1 mg/dL (ref 8.9–10.3)
Chloride: 105 mmol/L (ref 101–111)
GFR calc non Af Amer: 30 mL/min — ABNORMAL LOW (ref >60)
GFR, EST AFRICAN AMERICAN: 35 mL/min — AB (ref >60)
Glucose, Bld: 111 mg/dL — ABNORMAL HIGH (ref 65–99)
Potassium: 4.1 mmol/L (ref 3.5–5.1)
SODIUM: 141 mmol/L (ref 135–145)

## 2016-01-14 LAB — URINE MICROSCOPIC-ADD ON

## 2016-01-14 LAB — CBC
HCT: 34.3 % — ABNORMAL LOW (ref 36.0–46.0)
Hemoglobin: 10.5 g/dL — ABNORMAL LOW (ref 12.0–15.0)
MCH: 22.5 pg — ABNORMAL LOW (ref 26.0–34.0)
MCHC: 30.6 g/dL (ref 30.0–36.0)
MCV: 73.4 fL — AB (ref 78.0–100.0)
PLATELETS: 262 10*3/uL (ref 150–400)
RBC: 4.67 MIL/uL (ref 3.87–5.11)
RDW: 15.9 % — ABNORMAL HIGH (ref 11.5–15.5)
WBC: 7.9 10*3/uL (ref 4.0–10.5)

## 2016-01-14 LAB — SURGICAL PCR SCREEN
MRSA, PCR: NEGATIVE
Staphylococcus aureus: NEGATIVE

## 2016-01-14 LAB — GLUCOSE, CAPILLARY: Glucose-Capillary: 152 mg/dL — ABNORMAL HIGH (ref 65–99)

## 2016-01-14 LAB — TYPE AND SCREEN
ABO/RH(D): B POS
ANTIBODY SCREEN: NEGATIVE

## 2016-01-14 LAB — ABO/RH: ABO/RH(D): B POS

## 2016-01-14 NOTE — Progress Notes (Signed)
Pt denies cardiac history, chest pain or sob. Pt is diabetic, not sure what or when her last A1C was (will request from Dr. Chase Caller office). States she occasionally checks her fasting blood sugar and it usually runs between 122-163.

## 2016-01-14 NOTE — Progress Notes (Signed)
   01/14/16 1318  OBSTRUCTIVE SLEEP APNEA  Have you ever been diagnosed with sleep apnea through a sleep study? No  Do you snore loudly (loud enough to be heard through closed doors)?  1  Do you often feel tired, fatigued, or sleepy during the daytime (such as falling asleep during driving or talking to someone)? 1  Has anyone observed you stop breathing during your sleep? 1  Do you have, or are you being treated for high blood pressure? 1  BMI more than 35 kg/m2? 1  Age > 50 (1-yes) 1  Neck circumference greater than:Female 16 inches or larger, Female 17inches or larger? 0  Female Gender (Yes=1) 0  Obstructive Sleep Apnea Score 6  Score 5 or greater  Results sent to PCP

## 2016-01-14 NOTE — Pre-Procedure Instructions (Signed)
Mehreen Azizi  01/14/2016      Your procedure is scheduled on Tuesday, January 19, 2016 at 11:50 AM.   Report to [facility] Entrance "A" Admitting Office at 9:40 AM.   Call this number if you have problems the morning of surgery: [phone]                Any questions prior to day of surgery, please call [phone] between 8 & 4 PM.   Remember:  Do not eat food or drink liquids after midnight Monday,  01/18/16.  Take these medicines the morning of surgery with A SIP OF WATER: Amlodipine (Norvasc)  Stop Aspirin and NSAIDS (Diclofenac, Ibuprofen, Aleve, etc) as of today.  How to Manage Your Diabetes Before Surgery   Why is it important to control my blood sugar before and after surgery?   Improving blood sugar levels before and after surgery helps healing and can limit problems.  A way of improving blood sugar control is eating a healthy diet by:  - Eating less sugar and carbohydrates  - Increasing activity/exercise  - Talk with your doctor about reaching your blood sugar goals  High blood sugars (greater than 180 mg/dL) can raise your risk of infections and slow down your recovery so you will need to focus on controlling your diabetes during the weeks before surgery.  Make sure that the doctor who takes care of your diabetes knows about your planned surgery including the date and location.  How do I manage my blood sugars before surgery?   Check your blood sugar at least 4 times a day, 2 days before surgery to make sure that they are not too high or low.   Check your blood sugar the morning of your surgery when you wake up and every 2 hours until you get to the Short-Stay unit.   Treat a low blood sugar (less than 70 mg/dL) with 1/2 cup of clear juice (cranberry or apple), 4 glucose tablets, OR glucose gel.   Recheck blood sugar in 15 minutes after treatment (to make sure it is greater than 70 mg/dL).  If blood sugar is not greater than 70 mg/dL on  re-check, call [phone] for further instructions.    Report your blood sugar to the Short-Stay nurse when you get to Short-Stay.  References:  [facility], 2007 "How to Manage your Diabetes Before and After Surgery".  What do I do about my diabetes medications?   Do not take oral diabetes medicines (pills) the morning of surgery.   Do not wear jewelry, make-up or nail polish.  Do not wear lotions, powders, or perfumes.  You may NOT wear deodorant.  Do not shave 48 hours prior to surgery.    Do not bring valuables to the hospital.  [facility] is not responsible for any belongings or valuables.  Contacts, dentures or bridgework may not be worn into surgery.  Leave your suitcase in the car.  After surgery it may be brought to your room.  For patients admitted to the hospital, discharge time will be determined by your treatment team.  Special instructions:  [place] - Preparing for Surgery  Before surgery, you can play an important role.  Because skin is not sterile, your skin needs to be as free of germs as possible.  You can reduce the number of germs on you skin by washing with CHG (chlorahexidine gluconate) soap before surgery.  CHG is an antiseptic cleaner which kills  germs and bonds with the skin to continue killing germs even after washing.  Please DO NOT use if you have an allergy to CHG or antibacterial soaps.  If your skin becomes reddened/irritated stop using the CHG and inform your nurse when you arrive at Short Stay.  Do not shave (including legs and underarms) for at least 48 hours prior to the first CHG shower.  You may shave your face.  Please follow these instructions carefully:   1.  Shower with CHG Soap the night before surgery and the                                morning of Surgery.  2.  If you choose to wash your hair, wash your hair first as usual with your       normal shampoo.  3.  After you shampoo, rinse your hair and body  thoroughly to remove the                      Shampoo.  4.  Use CHG as you would any other liquid soap.  You can apply chg directly       to the skin and wash gently with scrungie or a clean washcloth.  5.  Apply the CHG Soap to your body ONLY FROM THE NECK DOWN.        Do not use on open wounds or open sores.  Avoid contact with your eyes, ears, mouth and genitals (private parts).  Wash genitals (private parts) with your normal soap.  6.  Wash thoroughly, paying special attention to the area where your surgery        will be performed.  7.  Thoroughly rinse your body with warm water from the neck down.  8.  DO NOT shower/wash with your normal soap after using and rinsing off       the CHG Soap.  9.  Pat yourself dry with a clean towel.            10.  Wear clean pajamas.            11.  Place clean sheets on your bed the night of your first shower and do not        sleep with pets.  Day of Surgery  Do not apply any lotions/deodorants the morning of surgery.  Please wear clean clothes to the hospital/surgery center.   Please read over the following fact sheets that you were given. Pain Booklet, Coughing and Deep Breathing, Blood Transfusion Information, MRSA Information and Surgical Site Infection Prevention

## 2016-01-15 LAB — URINE CULTURE

## 2016-01-15 NOTE — Progress Notes (Addendum)
Anesthesia Chart Review:  Pt is a 75 year old female scheduled for R reverse total shoulder arthroplasty on 01/19/2016 with Dr. August Saucer.   PCP is Dr. [name].   PMH includes:  HTN, DM, CKD (stage III). Former smoker. BMI 37.5.   Medications include: amlodipine, ASA, lipitor, metformin, olmesartan-hctz.   Preoperative labs reviewed.  Cr 1.62, BUN 37. Urine microscopy showed many bacteria. Urine culture pending.   Chest x-ray 01/14/16 reviewed. No active cardiopulmonary disease. Mild degenerative changes thoracic spine.  EKG 01/14/16: NSR. Minimal voltage criteria for LVH, may be normal variant. Inferior infarct, age undetermined. T wave abnormality, consider lateral ischemia. No old EKG available for comparison.  Reviewed case with Dr. Jean Rosenthal. If Dr. Wynelle Link is ok with pt proceeding with her current renal function, I anticipate pt can proceed as scheduled. I have left a message for Dr. Wynelle Link with Cr results.   Rica Mast, FNP-BC [facility] Short Stay Surgical Center/Anesthesiology Phone: [phone] 01/15/2016 12:22 PM  Addendum:  Dr. Wynelle Link sent a note stating pt's creatinine of 1.62 is close to pt's baseline, known CKD stage III, ok to proceed with surgery.   If no changes, I anticipate pt can proceed with surgery as scheduled.   Rica Mast, FNP-BC [facility] Short Stay Surgical Center/Anesthesiology Phone: [phone] 01/18/2016 12:02 PM

## 2016-01-18 ENCOUNTER — Encounter (HOSPITAL_COMMUNITY): Payer: Self-pay

## 2016-01-18 MED ORDER — CHLORHEXIDINE GLUCONATE 4 % EX LIQD: 60.0000 mL | Freq: Once | CUTANEOUS | Status: DC

## 2016-01-18 MED ORDER — CEFAZOLIN SODIUM-DEXTROSE 2-3 GM-% IV SOLR
2.0000 g | INTRAVENOUS | Status: DC
  Filled 2016-01-18: qty 50

## 2016-01-19 ENCOUNTER — Inpatient Hospital Stay (HOSPITAL_COMMUNITY): Payer: Medicare Other

## 2016-01-19 ENCOUNTER — Inpatient Hospital Stay (HOSPITAL_COMMUNITY): Payer: Medicare Other | Admitting: Anesthesiology

## 2016-01-19 ENCOUNTER — Inpatient Hospital Stay (HOSPITAL_COMMUNITY): Payer: Medicare Other | Admitting: Emergency Medicine

## 2016-01-19 ENCOUNTER — Encounter (HOSPITAL_COMMUNITY)
Admission: RE | Disposition: A | Discharge status: Home / Self Care | Payer: Self-pay | Source: Ambulatory Visit | Attending: Orthopedic Surgery

## 2016-01-19 ENCOUNTER — Inpatient Hospital Stay (HOSPITAL_COMMUNITY)
Admission: RE | Admit: 2016-01-19 | Discharge: 2016-01-20 | DRG: 483 | Disposition: A | Discharge status: Home / Self Care | Payer: Medicare Other | Source: Ambulatory Visit | Attending: Orthopedic Surgery | Admitting: Orthopedic Surgery

## 2016-01-19 ENCOUNTER — Encounter (HOSPITAL_COMMUNITY): Payer: Self-pay | Admitting: Anesthesiology

## 2016-01-19 DIAGNOSIS — Z79899 Other long term (current) drug therapy: Secondary | ICD-10-CM | POA: Diagnosis not present

## 2016-01-19 DIAGNOSIS — Z7982 Long term (current) use of aspirin: Secondary | ICD-10-CM

## 2016-01-19 DIAGNOSIS — Z961 Presence of intraocular lens: Secondary | ICD-10-CM | POA: Diagnosis present

## 2016-01-19 DIAGNOSIS — Z87891 Personal history of nicotine dependence: Secondary | ICD-10-CM | POA: Diagnosis not present

## 2016-01-19 DIAGNOSIS — E1122 Type 2 diabetes mellitus with diabetic chronic kidney disease: Secondary | ICD-10-CM | POA: Diagnosis present

## 2016-01-19 DIAGNOSIS — Z96611 Presence of right artificial shoulder joint: Secondary | ICD-10-CM | POA: Diagnosis not present

## 2016-01-19 DIAGNOSIS — M75121 Complete rotator cuff tear or rupture of right shoulder, not specified as traumatic: Secondary | ICD-10-CM | POA: Diagnosis not present

## 2016-01-19 DIAGNOSIS — Z9849 Cataract extraction status, unspecified eye: Secondary | ICD-10-CM | POA: Diagnosis not present

## 2016-01-19 DIAGNOSIS — M25511 Pain in right shoulder: Secondary | ICD-10-CM | POA: Diagnosis not present

## 2016-01-19 DIAGNOSIS — Z7984 Long term (current) use of oral hypoglycemic drugs: Secondary | ICD-10-CM | POA: Diagnosis not present

## 2016-01-19 DIAGNOSIS — M19011 Primary osteoarthritis, right shoulder: Principal | ICD-10-CM | POA: Diagnosis present

## 2016-01-19 DIAGNOSIS — I129 Hypertensive chronic kidney disease with stage 1 through stage 4 chronic kidney disease, or unspecified chronic kidney disease: Secondary | ICD-10-CM | POA: Diagnosis present

## 2016-01-19 DIAGNOSIS — Z833 Family history of diabetes mellitus: Secondary | ICD-10-CM

## 2016-01-19 DIAGNOSIS — N183 Chronic kidney disease, stage 3 (moderate): Secondary | ICD-10-CM | POA: Diagnosis present

## 2016-01-19 DIAGNOSIS — S43014D Anterior dislocation of right humerus, subsequent encounter: Secondary | ICD-10-CM | POA: Diagnosis not present

## 2016-01-19 DIAGNOSIS — G8918 Other acute postprocedural pain: Secondary | ICD-10-CM | POA: Diagnosis not present

## 2016-01-19 DIAGNOSIS — M19019 Primary osteoarthritis, unspecified shoulder: Secondary | ICD-10-CM | POA: Diagnosis present

## 2016-01-19 DIAGNOSIS — Z471 Aftercare following joint replacement surgery: Secondary | ICD-10-CM | POA: Diagnosis not present

## 2016-01-19 HISTORY — PX: TOTAL SHOULDER ARTHROPLASTY: SHX126

## 2016-01-19 LAB — GLUCOSE, CAPILLARY
Glucose-Capillary: 121 mg/dL — ABNORMAL HIGH (ref 65–99)
Glucose-Capillary: 134 mg/dL — ABNORMAL HIGH (ref 65–99)
Glucose-Capillary: 258 mg/dL — ABNORMAL HIGH (ref 65–99)

## 2016-01-19 SURGERY — ARTHROPLASTY, SHOULDER, TOTAL
Anesthesia: General | Site: Shoulder | Laterality: Right

## 2016-01-19 MED ORDER — VECURONIUM BROMIDE 10 MG IV SOLR
INTRAVENOUS | Status: AC
  Filled 2016-01-19: qty 10

## 2016-01-19 MED ORDER — ACETAMINOPHEN 650 MG RE SUPP: 650.0000 mg | Freq: Four times a day (QID) | RECTAL | Status: DC | PRN

## 2016-01-19 MED ORDER — MIDAZOLAM HCL 2 MG/2ML IJ SOLN
INTRAMUSCULAR | Status: AC
  Administered 2016-01-19: 1 mg via INTRAVENOUS
  Filled 2016-01-19: qty 2

## 2016-01-19 MED ORDER — FENTANYL CITRATE (PF) 100 MCG/2ML IJ SOLN
50.0000 ug | Freq: Once | INTRAMUSCULAR | Status: AC
Start: 2016-01-19 — End: 2016-01-19
  Administered 2016-01-19: 50 ug via INTRAVENOUS
  Filled 2016-01-19: qty 1

## 2016-01-19 MED ORDER — FENTANYL CITRATE (PF) 100 MCG/2ML IJ SOLN
INTRAMUSCULAR | Status: DC | PRN
  Administered 2016-01-19: 50 ug via INTRAVENOUS

## 2016-01-19 MED ORDER — OLMESARTAN MEDOXOMIL-HCTZ 40-25 MG PO TABS: 1.0000 | ORAL_TABLET | Freq: Every day | ORAL | Status: DC

## 2016-01-19 MED ORDER — HYDROCHLOROTHIAZIDE 25 MG PO TABS
25.0000 mg | ORAL_TABLET | Freq: Every day | ORAL | Status: DC
  Administered 2016-01-20: 25 mg via ORAL
  Filled 2016-01-19: qty 1

## 2016-01-19 MED ORDER — LIDOCAINE HCL (CARDIAC) 20 MG/ML IV SOLN
INTRAVENOUS | Status: DC | PRN
  Administered 2016-01-19: 30 mg via INTRAVENOUS

## 2016-01-19 MED ORDER — PHENYLEPHRINE HCL 10 MG/ML IJ SOLN
INTRAMUSCULAR | Status: AC
  Filled 2016-01-19: qty 1

## 2016-01-19 MED ORDER — DEXAMETHASONE SODIUM PHOSPHATE 4 MG/ML IJ SOLN
INTRAMUSCULAR | Status: DC | PRN
  Administered 2016-01-19: 4 mg via INTRAVENOUS

## 2016-01-19 MED ORDER — ROCURONIUM BROMIDE 100 MG/10ML IV SOLN
INTRAVENOUS | Status: DC | PRN
  Administered 2016-01-19: 40 mg via INTRAVENOUS
  Administered 2016-01-19: 10 mg via INTRAVENOUS

## 2016-01-19 MED ORDER — LIDOCAINE HCL (CARDIAC) 20 MG/ML IV SOLN
INTRAVENOUS | Status: AC
  Filled 2016-01-19: qty 5

## 2016-01-19 MED ORDER — CEFAZOLIN SODIUM-DEXTROSE 2-3 GM-% IV SOLR
2.0000 g | Freq: Two times a day (BID) | INTRAVENOUS | Status: AC
  Administered 2016-01-19 – 2016-01-20 (×2): 2 g via INTRAVENOUS
  Filled 2016-01-19 (×2): qty 50

## 2016-01-19 MED ORDER — DEXAMETHASONE SODIUM PHOSPHATE 4 MG/ML IJ SOLN
INTRAMUSCULAR | Status: AC
  Filled 2016-01-19: qty 1

## 2016-01-19 MED ORDER — PROPOFOL 10 MG/ML IV BOLUS
INTRAVENOUS | Status: DC | PRN
  Administered 2016-01-19: 140 mg via INTRAVENOUS

## 2016-01-19 MED ORDER — SUGAMMADEX SODIUM 200 MG/2ML IV SOLN
INTRAVENOUS | Status: DC | PRN
  Administered 2016-01-19: 200 mg via INTRAVENOUS

## 2016-01-19 MED ORDER — METOCLOPRAMIDE HCL 5 MG/ML IJ SOLN: 10.0000 mg | Freq: Once | INTRAMUSCULAR | Status: DC | PRN

## 2016-01-19 MED ORDER — POTASSIUM CHLORIDE IN NACL 20-0.9 MEQ/L-% IV SOLN
INTRAVENOUS | Status: DC
  Administered 2016-01-19 – 2016-01-20 (×2): via INTRAVENOUS
  Filled 2016-01-19 (×2): qty 1000

## 2016-01-19 MED ORDER — EPHEDRINE SULFATE 50 MG/ML IJ SOLN
INTRAMUSCULAR | Status: DC | PRN
  Administered 2016-01-19: 5 mg via INTRAVENOUS
  Administered 2016-01-19 (×2): 10 mg via INTRAVENOUS
  Administered 2016-01-19: 5 mg via INTRAVENOUS
  Administered 2016-01-19: 10 mg via INTRAVENOUS
  Administered 2016-01-19: 5 mg via INTRAVENOUS

## 2016-01-19 MED ORDER — STERILE WATER FOR INJECTION IJ SOLN
INTRAMUSCULAR | Status: AC
  Filled 2016-01-19: qty 10

## 2016-01-19 MED ORDER — ATORVASTATIN CALCIUM 20 MG PO TABS: 20.0000 mg | ORAL_TABLET | Freq: Every day | ORAL | Status: DC

## 2016-01-19 MED ORDER — ACETAMINOPHEN 325 MG PO TABS: 650.0000 mg | ORAL_TABLET | Freq: Four times a day (QID) | ORAL | Status: DC | PRN

## 2016-01-19 MED ORDER — 0.9 % SODIUM CHLORIDE (POUR BTL) OPTIME
TOPICAL | Status: DC | PRN
  Administered 2016-01-19 (×4): 1000 mL

## 2016-01-19 MED ORDER — METFORMIN HCL 500 MG PO TABS
500.0000 mg | ORAL_TABLET | Freq: Every day | ORAL | Status: DC
  Administered 2016-01-20: 500 mg via ORAL
  Filled 2016-01-19: qty 1

## 2016-01-19 MED ORDER — ONDANSETRON HCL 4 MG/2ML IJ SOLN
INTRAMUSCULAR | Status: AC
  Filled 2016-01-19: qty 2

## 2016-01-19 MED ORDER — METOCLOPRAMIDE HCL 5 MG/ML IJ SOLN: 5.0000 mg | Freq: Three times a day (TID) | INTRAMUSCULAR | Status: DC | PRN

## 2016-01-19 MED ORDER — IRBESARTAN 300 MG PO TABS
300.0000 mg | ORAL_TABLET | Freq: Every day | ORAL | Status: DC
  Administered 2016-01-20: 300 mg via ORAL
  Filled 2016-01-19: qty 1

## 2016-01-19 MED ORDER — CYCLOBENZAPRINE HCL 10 MG PO TABS
10.0000 mg | ORAL_TABLET | Freq: Three times a day (TID) | ORAL | Status: DC | PRN
  Administered 2016-01-19 – 2016-01-20 (×2): 10 mg via ORAL
  Filled 2016-01-19 (×2): qty 1

## 2016-01-19 MED ORDER — MORPHINE SULFATE (PF) 2 MG/ML IV SOLN: 2.0000 mg | INTRAVENOUS | Status: DC | PRN

## 2016-01-19 MED ORDER — VECURONIUM BROMIDE 10 MG IV SOLR
INTRAVENOUS | Status: DC | PRN
  Administered 2016-01-19: 2 mg via INTRAVENOUS

## 2016-01-19 MED ORDER — FENTANYL CITRATE (PF) 100 MCG/2ML IJ SOLN
INTRAMUSCULAR | Status: AC
Start: 2016-01-19 — End: 2016-01-19
  Filled 2016-01-19: qty 2

## 2016-01-19 MED ORDER — CEFAZOLIN SODIUM-DEXTROSE 2-3 GM-% IV SOLR
INTRAVENOUS | Status: DC | PRN
  Administered 2016-01-19: 2 g via INTRAVENOUS

## 2016-01-19 MED ORDER — FENTANYL CITRATE (PF) 100 MCG/2ML IJ SOLN: 25.0000 ug | INTRAMUSCULAR | Status: DC | PRN

## 2016-01-19 MED ORDER — SODIUM CHLORIDE 0.9 % IV SOLN
INTRAVENOUS | Status: DC
  Administered 2016-01-19 (×3): via INTRAVENOUS

## 2016-01-19 MED ORDER — ONDANSETRON HCL 4 MG/2ML IJ SOLN
INTRAMUSCULAR | Status: DC | PRN
  Administered 2016-01-19: 4 mg via INTRAVENOUS

## 2016-01-19 MED ORDER — PHENOL 1.4 % MT LIQD: 1.0000 | OROMUCOSAL | Status: DC | PRN

## 2016-01-19 MED ORDER — FENTANYL CITRATE (PF) 250 MCG/5ML IJ SOLN
INTRAMUSCULAR | Status: AC
  Filled 2016-01-19: qty 5

## 2016-01-19 MED ORDER — MIDAZOLAM HCL 5 MG/ML IJ SOLN: 1.0000 mg | Freq: Once | INTRAMUSCULAR | Status: DC

## 2016-01-19 MED ORDER — ROCURONIUM BROMIDE 50 MG/5ML IV SOLN
INTRAVENOUS | Status: AC
  Filled 2016-01-19: qty 1

## 2016-01-19 MED ORDER — INSULIN ASPART 100 UNIT/ML ~~LOC~~ SOLN: 0.0000 [IU] | Freq: Three times a day (TID) | SUBCUTANEOUS | Status: DC

## 2016-01-19 MED ORDER — ONDANSETRON HCL 4 MG/2ML IJ SOLN: 4.0000 mg | Freq: Four times a day (QID) | INTRAMUSCULAR | Status: DC | PRN

## 2016-01-19 MED ORDER — OXYCODONE HCL 5 MG PO TABS
5.0000 mg | ORAL_TABLET | ORAL | Status: DC | PRN
  Administered 2016-01-19 (×2): 5 mg via ORAL
  Administered 2016-01-20: 10 mg via ORAL
  Administered 2016-01-20 (×2): 5 mg via ORAL
  Filled 2016-01-19 (×3): qty 1
  Filled 2016-01-19 (×2): qty 2

## 2016-01-19 MED ORDER — ONDANSETRON HCL 4 MG PO TABS: 4.0000 mg | ORAL_TABLET | Freq: Four times a day (QID) | ORAL | Status: DC | PRN

## 2016-01-19 MED ORDER — MEPERIDINE HCL 25 MG/ML IJ SOLN: 6.2500 mg | INTRAMUSCULAR | Status: DC | PRN

## 2016-01-19 MED ORDER — AMLODIPINE BESYLATE 10 MG PO TABS
10.0000 mg | ORAL_TABLET | Freq: Every day | ORAL | Status: DC
  Administered 2016-01-20: 10 mg via ORAL
  Filled 2016-01-19: qty 1

## 2016-01-19 MED ORDER — MENTHOL 3 MG MT LOZG: 1.0000 | LOZENGE | OROMUCOSAL | Status: DC | PRN

## 2016-01-19 MED ORDER — METOCLOPRAMIDE HCL 5 MG PO TABS: 5.0000 mg | ORAL_TABLET | Freq: Three times a day (TID) | ORAL | Status: DC | PRN

## 2016-01-19 MED ORDER — BUPIVACAINE-EPINEPHRINE (PF) 0.5% -1:200000 IJ SOLN
INTRAMUSCULAR | Status: DC | PRN
  Administered 2016-01-19: 30 mL via PERINEURAL

## 2016-01-19 MED ORDER — EPHEDRINE SULFATE 50 MG/ML IJ SOLN
INTRAMUSCULAR | Status: AC
  Filled 2016-01-19: qty 1

## 2016-01-19 MED ORDER — SODIUM CHLORIDE 0.9 % IV SOLN
10.0000 mg | INTRAVENOUS | Status: DC | PRN
  Administered 2016-01-19: 10 ug/min via INTRAVENOUS

## 2016-01-19 MED ORDER — ASPIRIN EC 325 MG PO TBEC
325.0000 mg | DELAYED_RELEASE_TABLET | Freq: Every day | ORAL | Status: DC
  Administered 2016-01-20: 325 mg via ORAL
  Filled 2016-01-19: qty 1

## 2016-01-19 MED ORDER — SUGAMMADEX SODIUM 200 MG/2ML IV SOLN
INTRAVENOUS | Status: AC
  Filled 2016-01-19: qty 2

## 2016-01-19 MED ORDER — GLYCOPYRROLATE 0.2 MG/ML IJ SOLN
INTRAMUSCULAR | Status: DC | PRN
  Administered 2016-01-19: .2 mg via INTRAVENOUS

## 2016-01-19 MED ORDER — GLYCOPYRROLATE 0.2 MG/ML IJ SOLN
INTRAMUSCULAR | Status: AC
  Filled 2016-01-19: qty 1

## 2016-01-19 MED ORDER — PROPOFOL 10 MG/ML IV BOLUS
INTRAVENOUS | Status: AC
  Filled 2016-01-19: qty 20

## 2016-01-19 MED ORDER — SODIUM CHLORIDE 0.9 % IJ SOLN
INTRAMUSCULAR | Status: AC
  Filled 2016-01-19: qty 10

## 2016-01-19 SURGICAL SUPPLY — 75 items
BASEPLATE GLENOID SHLDR SM (Shoulder) ×2 IMPLANT
BLADE SAW SGTL 13X75X1.27 (BLADE) ×3 IMPLANT
BSPLAT GLND SM PRFT SHLDR CA (Shoulder) ×1 IMPLANT
CLOSURE STERI-STRIP 1/2X4 (GAUZE/BANDAGES/DRESSINGS) ×1
CLSR STERI-STRIP ANTIMIC 1/2X4 (GAUZE/BANDAGES/DRESSINGS) ×1 IMPLANT
COVER SURGICAL LIGHT HANDLE (MISCELLANEOUS) ×3 IMPLANT
COVER TABLE BACK 60X90 (DRAPES) IMPLANT
DRAPE C-ARM 42X72 X-RAY (DRAPES) IMPLANT
DRAPE IMP U-DRAPE 54X76 (DRAPES) ×3 IMPLANT
DRAPE INCISE IOBAN 66X45 STRL (DRAPES) ×6 IMPLANT
DRAPE U-SHAPE 47X51 STRL (DRAPES) ×6 IMPLANT
DRSG AQUACEL AG ADV 3.5X10 (GAUZE/BANDAGES/DRESSINGS) ×3 IMPLANT
DRSG PAD ABDOMINAL 8X10 ST (GAUZE/BANDAGES/DRESSINGS) ×2 IMPLANT
DURAPREP 26ML APPLICATOR (WOUND CARE) ×3 IMPLANT
ELECT BLADE 6.5 EXT (BLADE) IMPLANT
ELECT REM PT RETURN 9FT ADLT (ELECTROSURGICAL) ×3
ELECTRODE REM PT RTRN 9FT ADLT (ELECTROSURGICAL) ×1 IMPLANT
EVACUATOR 1/8 PVC DRAIN (DRAIN) IMPLANT
GAUZE SPONGE 4X4 12PLY STRL (GAUZE/BANDAGES/DRESSINGS) ×3 IMPLANT
GLENOSPHERE LATERAL 36MM+4 (Shoulder) ×2 IMPLANT
GLOVE BIOGEL PI IND STRL 7.5 (GLOVE) ×1 IMPLANT
GLOVE BIOGEL PI IND STRL 8 (GLOVE) ×1 IMPLANT
GLOVE BIOGEL PI INDICATOR 7.5 (GLOVE) ×2
GLOVE BIOGEL PI INDICATOR 8 (GLOVE) ×2
GLOVE ECLIPSE 7.0 STRL STRAW (GLOVE) ×3 IMPLANT
GLOVE SURG ORTHO 8.0 STRL STRW (GLOVE) ×3 IMPLANT
GOWN STRL REUS W/ TWL LRG LVL3 (GOWN DISPOSABLE) ×2 IMPLANT
GOWN STRL REUS W/ TWL XL LVL3 (GOWN DISPOSABLE) ×1 IMPLANT
GOWN STRL REUS W/TWL LRG LVL3 (GOWN DISPOSABLE) ×6
GOWN STRL REUS W/TWL XL LVL3 (GOWN DISPOSABLE) ×3
KIT BASIN OR (CUSTOM PROCEDURE TRAY) ×3 IMPLANT
KIT BEACH CHAIR TRIMANO (Kit) ×2 IMPLANT
KIT ROOM TURNOVER OR (KITS) ×3 IMPLANT
LINER HUMERAL 36 +3MM SM (Shoulder) ×2 IMPLANT
LOOP VESSEL MAXI BLUE (MISCELLANEOUS) ×2 IMPLANT
MANIFOLD NEPTUNE II (INSTRUMENTS) ×3 IMPLANT
NDL HYPO 25GX1X1/2 BEV (NEEDLE) IMPLANT
NDL SUT 6 .5 CRC .975X.05 MAYO (NEEDLE) ×1 IMPLANT
NEEDLE HYPO 25GX1X1/2 BEV (NEEDLE) IMPLANT
NEEDLE MAYO TAPER (NEEDLE) ×3
NS IRRIG 1000ML POUR BTL (IV SOLUTION) ×9 IMPLANT
PACK SHOULDER (CUSTOM PROCEDURE TRAY) ×3 IMPLANT
PACK UNIVERSAL I (CUSTOM PROCEDURE TRAY) ×1 IMPLANT
PAD ARMBOARD 7.5X6 YLW CONV (MISCELLANEOUS) ×6 IMPLANT
SCREW CENTRAL NONLOCK 6.5X20MM (Shoulder) ×2 IMPLANT
SCREW LOCK PERIPHERAL 36MM (Screw) ×4 IMPLANT
SET PIN UNIVERSAL REVERSE (SET/KITS/TRAYS/PACK) ×2 IMPLANT
SLING ARM FOAM STRAP LRG (SOFTGOODS) ×2 IMPLANT
SPACER SHLD UNI REV 36 +6 (Shoulder) ×1 IMPLANT
SPACER TI 36/+6MM (Shoulder) ×1 IMPLANT
SPONGE LAP 18X18 X RAY DECT (DISPOSABLE) ×5 IMPLANT
STEM REVERSE SIZE 5-135 (Shoulder) ×2 IMPLANT
SUCTION FRAZIER HANDLE 10FR (MISCELLANEOUS) ×2
SUCTION TUBE FRAZIER 10FR DISP (MISCELLANEOUS) ×1 IMPLANT
SUT FIBERWIRE #2 38 T-5 BLUE (SUTURE) ×3
SUT MAXBRAID (SUTURE) IMPLANT
SUT MNCRL AB 3-0 PS2 18 (SUTURE) ×2 IMPLANT
SUT PROLENE 3 0 PS 2 (SUTURE) ×1 IMPLANT
SUT SILK 2 0 (SUTURE) ×3
SUT SILK 2-0 18XBRD TIE 12 (SUTURE) IMPLANT
SUT VIC AB 0 CT1 27 (SUTURE) ×6
SUT VIC AB 0 CT1 27XBRD ANBCTR (SUTURE) IMPLANT
SUT VIC AB 0 CTB1 27 (SUTURE) ×6 IMPLANT
SUT VIC AB 1 CT1 27 (SUTURE) ×9
SUT VIC AB 1 CT1 27XBRD ANBCTR (SUTURE) ×1 IMPLANT
SUT VIC AB 2-0 CT1 27 (SUTURE) ×9
SUT VIC AB 2-0 CT1 TAPERPNT 27 (SUTURE) ×3 IMPLANT
SUTURE FIBERWR #2 38 T-5 BLUE (SUTURE) ×1 IMPLANT
SYR CONTROL 10ML LL (SYRINGE) ×1 IMPLANT
TOWEL OR 17X24 6PK STRL BLUE (TOWEL DISPOSABLE) ×3 IMPLANT
TOWEL OR 17X26 10 PK STRL BLUE (TOWEL DISPOSABLE) ×3 IMPLANT
TRAY FOLEY BAG SILVER LF 16FR (CATHETERS) IMPLANT
TRAY FOLEY W/METER SILVER 16FR (SET/KITS/TRAYS/PACK) ×2 IMPLANT
TRIMANO KIT ×2 IMPLANT
WATER STERILE IRR 1000ML POUR (IV SOLUTION) ×1 IMPLANT

## 2016-01-19 NOTE — Brief Op Note (Signed)
01/19/2016  3:17 PM  PATIENT:  Dana Fleming  75 y.o. female  PRE-OPERATIVE DIAGNOSIS:  RIGHT SHOULDER ROTATOR CUFF ARTHROPATHY  POST-OPERATIVE DIAGNOSIS:  Right shoulder rotator cuff arthopathy  PROCEDURE:  Procedure(s): REVERSE TOTAL SHOULDER ARTHROPLASTY  SURGEON:  Surgeon(s): Cammy Copa, MD  ASSISTANT: Patrick Jupiter rnfa  ANESTHESIA:   regional and general  EBL: 200 ml    Total I/O In: 1200 [I.V.:1200] Out: 825 [Urine:675; Blood:150]  BLOOD ADMINISTERED: none  DRAINS: none   LOCAL MEDICATIONS USED:  none  SPECIMEN:  No Specimen  COUNTS:  YES  TOURNIQUET:  * No tourniquets in log *  DICTATION: .Other Dictation: Dictation Number   PLAN OF CARE: Admit to inpatient   PATIENT DISPOSITION:  PACU - hemodynamically stable

## 2016-01-19 NOTE — H&P (Signed)
Dana Fleming is an 75 y.o. female.   Chief Complaint: Right shoulder pain HPI: Dana Fleming is a 75 year old female with right shoulder pain and pseudoparalysis. She sustained an injury a couple months ago. Has had an inability to really be functional with the arm since that time MRI scan shows rotator cuff arthropathy with 3 tendon tear with retraction. She presents now after failure of conservative management  Past Medical History  Diagnosis Date  . Hypertension   . Diabetes mellitus without complication (HCC)     Type 2  . Headache     had migraines in her 20's  . Family history of adverse reaction to anesthesia     cousin didn't know he had sleep apnea and was on a ventilator for a month after  . CKD (chronic kidney disease), stage III     Past Surgical History  Procedure Laterality Date  . Eye surgery Bilateral     cataract surgery with lens implant  . Multiple tooth extractions    . Closed reduction shoulder dislocation      had conscious sedation    Family History  Problem Relation Age of Onset  . Stroke Mother   . Diabetes Mother   . Diabetes Father   . Heart attack Father    Social History:  reports that she quit smoking about 25 years ago. She has never used smokeless tobacco. She reports that she drinks alcohol. She reports that she does not use illicit drugs.  Allergies: No Known Allergies  Medications Prior to Admission  Medication Sig Dispense Refill  . amLODipine (NORVASC) 10 MG tablet Take 10 mg by mouth daily.  0  . aspirin EC 81 MG tablet Take 81 mg by mouth every other day.    Marland Kitchen atorvastatin (LIPITOR) 20 MG tablet Take 20 mg by mouth daily.  0  . cyclobenzaprine (FLEXERIL) 10 MG tablet Take 10 mg by mouth 3 (three) times daily as needed for muscle spasms.    . diclofenac (VOLTAREN) 75 MG EC tablet Take 75 mg by mouth 2 (two) times daily as needed for mild pain.    . metFORMIN (GLUCOPHAGE) 500 MG tablet Take 500 mg by mouth daily.  0  .  olmesartan-hydrochlorothiazide (BENICAR HCT) 40-25 MG tablet Take 1 tablet by mouth daily.  0  . HYDROcodone-acetaminophen (NORCO/VICODIN) 5-325 MG tablet Take 2 tablets by mouth every 4 (four) hours as needed. (Patient not taking: Reported on 01/12/2016) 12 tablet 0    Results for orders placed or performed during the hospital encounter of 01/19/16 (from the past 48 hour(s))  Glucose, capillary     Status: Abnormal   Collection Time: 01/19/16 10:23 AM  Result Value Ref Range   Glucose-Capillary 121 (H) 65 - 99 mg/dL   No results found.  Review of Systems  Constitutional: Negative.   HENT: Negative.   Eyes: Negative.   Respiratory: Negative.   Cardiovascular: Negative.   Gastrointestinal: Negative.   Genitourinary: Negative.   Musculoskeletal: Positive for joint pain.  Skin: Negative.   Neurological: Negative.   Endo/Heme/Allergies: Negative.   Psychiatric/Behavioral: Negative.     Blood pressure 140/61, pulse 76, temperature 98.4 F (36.9 C), temperature source Oral, height  (1.6 m), weight 96.163 kg (212 lb), SpO2 100 %. Physical Exam  Constitutional: She appears well-developed.  HENT:  Head: Normocephalic.  Eyes: Pupils are equal, round, and reactive to light.  Neck: Normal range of motion.  Cardiovascular: Normal rate.   Respiratory: Effort normal.  Neurological: She  is alert.  Skin: Skin is warm.  Psychiatric: She has a normal mood and affect.   examination the right arm demonstrates about 40 of active forward flexion 40 of active abduction significant weakness to infraspinatus 6 space and subscap muscle testing motor sensory function to the hand is intact passive range of motion of the shoulder is easily above head. Deltoid does fire  Assessment/Plan Impression is right shoulder rotator cuff arthropathy plan reverse shoulder replacement risks and benefits discussed with the patient could not limited to infection or vessel damage intraoperative fracture  dislocation potential need for implant revision QUESTIONS answered  Dana Copa, MD 01/19/2016, 10:34 AM

## 2016-01-19 NOTE — Anesthesia Preprocedure Evaluation (Addendum)
Anesthesia Evaluation  Patient identified by MRN, date of birth, ID band Patient awake    Reviewed: Allergy & Precautions, NPO status , Patient's Chart, lab work & pertinent test results  History of Anesthesia Complications (+) Family history of anesthesia reaction  Airway Mallampati: III  TM Distance: >3 FB Neck ROM: Full    Dental  (+) Missing   Pulmonary former smoker,  Snores- probable OSA   Pulmonary exam normal breath sounds clear to auscultation       Cardiovascular hypertension, Pt. on medications Normal cardiovascular exam Rhythm:Regular Rate:Normal     Neuro/Psych  Headaches, negative psych ROS   GI/Hepatic   Endo/Other  diabetes, Well Controlled, Type 2Obesity  Renal/GU Renal disease  negative genitourinary   Musculoskeletal  (+) Arthritis , Right shoulder arthropathy   Abdominal (+) + obese,   Peds  Hematology  (+) anemia ,   Anesthesia Other Findings   Reproductive/Obstetrics negative OB ROS                          Anesthesia Physical Anesthesia Plan  ASA: III  Anesthesia Plan: General   Post-op Pain Management: MAC Combined w/ Regional for Post-op pain and GA combined w/ Regional for post-op pain   Induction: Intravenous  Airway Management Planned: Oral ETT  Additional Equipment:   Intra-op Plan:   Post-operative Plan: Extubation in OR  Informed Consent: I have reviewed the patients History and Physical, chart, labs and discussed the procedure including the risks, benefits and alternatives for the proposed anesthesia with the patient or authorized representative who has indicated his/her understanding and acceptance.   Dental advisory given  Plan Discussed with: CRNA, Anesthesiologist and Surgeon  Anesthesia Plan Comments:        Anesthesia Quick Evaluation

## 2016-01-19 NOTE — Transfer of Care (Signed)
Immediate Anesthesia Transfer of Care Note  Patient: Dana Fleming  Procedure(s) Performed: Procedure(s) with comments: REVERSE TOTAL SHOULDER ARTHROPLASTY (Right) - REVERSE TOTAL SHOULDER ARTHROPLASTY  Patient Location: PACU  Anesthesia Type:GA combined with regional for post-op pain  Level of Consciousness: awake, oriented and patient cooperative  Airway & Oxygen Therapy: Patient Spontanous Breathing and Patient connected to nasal cannula oxygen  Post-op Assessment: Report given to RN, Post -op Vital signs reviewed and stable and Patient moving all extremities  Post vital signs: Reviewed and stable  Last Vitals:  Filed Vitals:   01/19/16 1105 01/19/16 1110  BP:    Pulse: 71 74  Temp:    Resp: 15 17    Complications: No apparent anesthesia complications

## 2016-01-19 NOTE — Anesthesia Procedure Notes (Addendum)
Anesthesia Regional Block:  Interscalene brachial plexus block  Pre-Anesthetic Checklist: ,, timeout performed, Correct Patient, Correct Site, Correct Laterality, Correct Procedure, Correct Position, site marked, Risks and benefits discussed,  Surgical consent,  Pre-op evaluation,  At surgeon's request and post-op pain management  Laterality: Right  Prep: chloraprep       Needles:   Needle Type: Echogenic Stimulator Needle     Needle Length: 10cm 10 cm Needle Gauge: 21 and 21 G  Needle insertion depth: 4 cm   Additional Needles:  Procedures: ultrasound guided (picture in chart) Interscalene brachial plexus block  Nerve Stimulator or Paresthesia:  Response: right wrist flexion, 20 mA, 4 cm  Additional Responses:   Narrative:  Start time: 01/19/2016 10:55 AM Injection made incrementally with aspirations every 5 mL.  Performed by: Personally  Anesthesiologist: Mal Amabile   Procedure Name: Intubation Date/Time: 01/19/2016 11:26 AM Performed by: Quentin Ore Pre-anesthesia Checklist: Patient identified, Emergency Drugs available, Suction available, Patient being monitored and Timeout performed Patient Re-evaluated:Patient Re-evaluated prior to inductionOxygen Delivery Method: Circle system utilized Preoxygenation: Pre-oxygenation with 100% oxygen Intubation Type: IV induction Ventilation: Mask ventilation without difficulty Laryngoscope Size: Mac and 3 Grade View: Grade II Tube type: Oral Tube size: 7.0 mm Number of attempts: 1 Airway Equipment and Method: Stylet Placement Confirmation: ETT inserted through vocal cords under direct vision,  positive ETCO2 and breath sounds checked- equal and bilateral Secured at: 21 cm Tube secured with: Tape Dental Injury: Teeth and Oropharynx as per pre-operative assessment

## 2016-01-19 NOTE — Anesthesia Postprocedure Evaluation (Signed)
Anesthesia Post Note  Patient: Dana Fleming  Procedure(s) Performed: Procedure(s) (LRB): REVERSE TOTAL SHOULDER ARTHROPLASTY (Right)  Patient location during evaluation: PACU Anesthesia Type: General and Regional Level of consciousness: awake and alert Pain management: pain level controlled Vital Signs Assessment: post-procedure vital signs reviewed and stable Respiratory status: spontaneous breathing, nonlabored ventilation, respiratory function stable and patient connected to nasal cannula oxygen Cardiovascular status: blood pressure returned to baseline and stable Postop Assessment: no signs of nausea or vomiting Anesthetic complications: no Comments: ISB working well.    Last Vitals:  Filed Vitals:   01/19/16 1110 01/19/16 1520  BP:  129/68  Pulse: 74 106  Temp:  36.8 C  Resp: 17 20    Last Pain: There were no vitals filed for this visit.               Cecile Hearing

## 2016-01-19 NOTE — Op Note (Signed)
NAMEKaiden, Dardis Yisell                 ACCOUNT NO.:  [id number]  MEDICAL RECORD NO.:  [id number]  LOCATION:  [id number]                        FACILITY:  [facility]  PHYSICIAN:  Burnard Bunting, M.D.    DATE OF BIRTH:  05/03/1941  DATE OF PROCEDURE: DATE OF DISCHARGE:                              OPERATIVE REPORT   PREOPERATIVE DIAGNOSIS:  Right shoulder rotator cuff arthropathy.  POSTOPERATIVE DIAGNOSIS:  Right shoulder rotator cuff arthropathy.  PROCEDURE:  Right shoulder reverse shoulder replacement, Arthrex small glenosphere +4 offset, size 5 baseplate, +9 spacer.  SURGEON:  Burnard Bunting, M.D.  ASSISTANT:  Patrick Jupiter, RNFA.  INDICATIONS:  Jakita is a 75 year old patient with right shoulder rotator cuff arthropathy with fairly diminished function in terms of motion in above shoulder level.  She presents now for operative management after failure of conservative management and explanation of risks and benefits, including, but not limited to infection, nerve and vessel damage, instability, potential need for more surgery.  PROCEDURE IN DETAIL:  The patient was brought to the operating room where general anesthetic was induced.  Preoperative antibiotics were administered.  Time-out was called.  The patient was placed in the beach- chair position with the head in neutral position, the right arm draped free.  The right arm, shoulder, hand prescrubbed with alcohol and Betadine particularly in the axilla, then prepped with DuraPrep solution and draped in a sterile manner.  Collier Flowers was used to cover the entire operative field.  Incision was made from just the lateral aspect of the coracoid process extending distally to a point about 2 cm lateral to the axillary crease.  Skin and subcutaneous tissue were sharply divided. Cephalic vein was identified and mobilized medially.  Crossing branches were ligated.  Deltopectoral interval was developed.  Adhesions were released manually from underneath the  deltoid.  The biceps tendon was visualized, it was tagged, released and then tenodesed to the pectoralis tendon, which had been released about a centimeter off its superior aspect.  At this time, the anterior humeral circumflex artery and veins were identified and ligated using silk ligatures.  Following that, the tagging suture was placed in the remnant of the capsule.  The subscap was torn and retracted back to the glenoid rim.  Axillary nerve was fairly adherent to this retracted subscap and it was manually bluntly dissected as well as visually dissected, tagged with a vessel loop.  The musculocutaneous nerve was also palpated underneath the coracoid process about 4 cm and care was taken to avoid undo pressure on this.  Larger median nerve also visualized and marked with vessel loop just posterior to the axillary nerve extending distally in the arm.  This was also retracted bluntly out of the way.  Following identification of the nerve, the capsule was released beginning at the head-neck junction around the 9 o'clock position extending around to about the 4 o'clock position.  It was dissected using a Cobb elevator for about 2 cm off the medial calcar.  At this time, the head was resected using intramedullary alignment.  This was done under approximately 30 degrees of retroversion, which was essentially her native retroversion.  Cap was placed on the proximal  humerus and then this was retracted with a posterior retractor.  At this time, circumferential 360 release was performed by first removing that biceps tendon remnants and then the labrum predominantly anteriorly.  Inferior glenoid retractor was then placed.  The capsule in between the approximately 3 to 6 o'clock position on the anterior humerus was then divided parallel to the glenoid face.  I did put my finger on top of the axillary nerve and this was done with electrocautery.  This release allowed for humeral head to fall away  to add exposure.  Labrum was then excised circumferentially. In accordance of preoperative templating, the guide was made to fit the patient's specific glenoid anatomy.  Guidepin was then placed and the vault was drilled first.  This gave good position in approximately neutral to slight inferior tilt.  The reaming was then performed and the small baseplate was placed parallel to the long axis of the glenoid. The reaming was predominantly inferior and less superior, but there was about 90% coverage of the baseplate to the bone.  Central screw was then placed with excellent fixation achieved, two peripheral screws were then placed with bicortical fixation achieved inferiorly and unicortical fixation achieved superiorly.  This gave very stable baseplate fixation. Morse taper small head was then placed and secured in good position.  At this time, attention was then directed towards the humeral shaft. Single broach was utilized, size 5, which achieved good placement and stability.  Then, because of the size 5, the true stem was placed, glenosphere cuff was then placed in position after cheese Grater reaming.  Then, the trial reduction was performed with both the 3 and the 6-mm spacers.  The 6-mm spacer gave good offset and reasonably good stability, but still had about 4 mm of inferior shot.  The dislocation was slightly higher than usual.  The size 9 spacer was chosen and this did give good restoration of tension to the conjoint tendon and to the deltoid.  This gave excellent stability with 2 mm of Shuck, good stability, internal and external rotation, had neutral adduction.  There was no bounce with adduction.  No real impingement of the greater tuberosity with forward flexion to 90 degrees and abduction.  At this time, thorough irrigation was performed.  The vessel loops were removed. Tug test positive on the axillary nerve both before exposure and after placement of the prosthesis.  The  deltoid split was then closed using 0 Vicryl suture followed by interrupted inverted 2-0 Vicryl suture and 3-0 Monocryl.  The patient tolerated the procedure well without immediate complication, transferred to the recovery room in stable condition. Sling was applied.     Burnard Bunting, M.D.     GSD/MEDQ  D:  01/19/2016  T:  01/19/2016  Job:  [id number]

## 2016-01-20 ENCOUNTER — Encounter (HOSPITAL_COMMUNITY): Payer: Self-pay | Admitting: Orthopedic Surgery

## 2016-01-20 LAB — HEMOGLOBIN A1C
HEMOGLOBIN A1C: 7.4 % — AB (ref 4.8–5.6)
MEAN PLASMA GLUCOSE: 166 mg/dL

## 2016-01-20 LAB — GLUCOSE, CAPILLARY
GLUCOSE-CAPILLARY: 158 mg/dL — AB (ref 65–99)
Glucose-Capillary: 127 mg/dL — ABNORMAL HIGH (ref 65–99)

## 2016-01-20 MED ORDER — OXYCODONE HCL 5 MG PO TABS: 5.0000 mg | ORAL_TABLET | ORAL | Status: DC | PRN

## 2016-01-20 NOTE — Progress Notes (Signed)
Pt stable vss Motor fxn right hand ok Pain controlled Plan dc today after OT

## 2016-01-20 NOTE — Evaluation (Signed)
Occupational Therapy Evaluation and Discharge Patient Details Name: Dana Fleming MRN: [id number] DOB: November 08, 1941 Today's Date: 01/20/2016    History of Present Illness Right reverse shoulder   Clinical Impression   This 26y yo female admitted and underwent above presents to acute OT with all education complete with her and family that will be staying with her. We will D/C from acute OT with follow up per surgeon.    Follow Up Recommendations  No OT follow up    Equipment Recommendations  None recommended by OT       Precautions / Restrictions Precautions Precautions: Shoulder Type of Shoulder Precautions: handout given with maximum degree  insturnctions given for shoulder movements written addtionally on handout Shoulder Interventions: Shoulder sling/immobilizer;Off for dressing/bathing/exercises Precaution Comments: active protocol as well as no pushing/pulling/lfiting with arm Required Braces or Orthoses: Sling Restrictions Weight Bearing Restrictions: Yes RUE Weight Bearing: Non weight bearing      Mobility Bed Mobility               General bed mobility comments: Pt up in recliner upon arrival  Transfers Overall transfer level: Needs assistance Equipment used: None Transfers: Sit to/from Stand Sit to Stand: Supervision         General transfer comment: Ambulated around in room with S         ADL Overall ADL's : Needs assistance/impaired Eating/Feeding: Set up;Sitting   Grooming: Moderate assistance (with S in standing)   Upper Body Bathing: Moderate assistance (with S for standing)   Lower Body Bathing: Moderate assistance (with S for standing)   Upper Body Dressing : Maximal assistance (with S for standing)including donning sling   Lower Body Dressing: Maximal assistance (with S for standing)   Toilet Transfer: Supervision/safety;Ambulation   Toileting- Clothing Manipulation and Hygiene: Minimal assistance (with S for standing)      Tub/Shower Transfer Details (indicate cue type and reason): Recommended to family and pt she get a tub seat and a hand held shower                   Pertinent Vitals/Pain Pain Assessment: 0-10 Pain Score: 2  Pain Location: RUE Pain Descriptors / Indicators: Aching;Sore Pain Intervention(s): Monitored during session;Repositioned     Hand Dominance Right   Extremity/Trunk Assessment Upper Extremity Assessment Upper Extremity Assessment: RUE deficits/detail RUE Deficits / Details: reverse shoulder this admission. Pt able to tolerate AAROM of shoulder flexion to 30 degrees; shoulder aduction to 30 degrees and external rotation to 10 degrees past neutral RUE Coordination: decreased fine motor;decreased gross motor           Communication Communication Communication: No difficulties   Cognition Arousal/Alertness: Awake/alert Behavior During Therapy: WFL for tasks assessed/performed Overall Cognitive Status: Within Functional Limits for tasks assessed                        Exercises   Other Exercises Other Exercises: Educated pt and family (they took video) of shoulder flexion, abduction and external rotation. Pt did 10 reps with me in sitting. Pt also did 10 reps of AROM elbow extension        Home Living Family/patient expects to be discharged to:: Private residence Living Arrangements: Other relatives Available Help at Discharge: Family;Available 24 hours/day Type of Home: House Home Access: Level entry     Home Layout: One level     Bathroom Shower/Tub: Tub/shower unit;Curtain Shower/tub characteristics: Engineer, building services: Handicapped height     Home  Equipment: None          Prior Functioning/Environment Level of Independence: Independent             OT Diagnosis: Generalized weakness         OT Goals(Current goals can be found in the care plan section) Acute Rehab OT Goals Patient Stated Goal: home today  OT Frequency:                 End of Session Equipment Utilized During Treatment:  (sling) Nurse Communication:  (Pt ready to go from OT standpoint)  Activity Tolerance: Patient tolerated treatment well Patient left: in chair;with call bell/phone within reach;with family/visitor present   Time: 4098-1191 OT Time Calculation (min): 78 min Charges:  OT General Charges $OT Visit: 1 Procedure OT Evaluation $OT Eval Moderate Complexity: 1 Procedure OT Treatments $Self Care/Home Management : 23-37 mins $Therapeutic Exercise: 23-37 mins  Evette Georges [phone] 01/20/2016, 2:32 PM

## 2016-01-20 NOTE — Progress Notes (Signed)
Utilization review completed.  

## 2016-01-20 NOTE — Progress Notes (Signed)
Pt ready for discharge. Education/instructions reviewed with pt and family members and all questions/cocnerns addressed. IV removed and belongings gathered. Pt will be transported out via wheelchair to family member's car. Will continue to monitor

## 2016-01-27 DIAGNOSIS — M25511 Pain in right shoulder: Secondary | ICD-10-CM | POA: Diagnosis not present

## 2016-01-27 DIAGNOSIS — M19011 Primary osteoarthritis, right shoulder: Secondary | ICD-10-CM | POA: Diagnosis not present

## 2016-02-01 DIAGNOSIS — M75121 Complete rotator cuff tear or rupture of right shoulder, not specified as traumatic: Secondary | ICD-10-CM | POA: Diagnosis not present

## 2016-02-01 DIAGNOSIS — S43014D Anterior dislocation of right humerus, subsequent encounter: Secondary | ICD-10-CM | POA: Diagnosis not present

## 2016-02-03 DIAGNOSIS — M19011 Primary osteoarthritis, right shoulder: Secondary | ICD-10-CM | POA: Diagnosis not present

## 2016-02-03 DIAGNOSIS — M25511 Pain in right shoulder: Secondary | ICD-10-CM | POA: Diagnosis not present

## 2016-02-05 NOTE — Discharge Summary (Signed)
Physician Discharge Summary  Patient ID: Dana Fleming MRN: [id number] DOB/AGE: 75-Oct-1942 75 y.o.  Admit date: 01/19/2016 Discharge date: 01/20/2016  Admission Diagnoses:  Active Problems:   Shoulder arthritis   Discharge Diagnoses:  Same  Surgeries: Procedure(s): REVERSE TOTAL SHOULDER ARTHROPLASTY on 01/19/2016   Consultants:    Discharged Condition: Stable  Hospital Course: Dana Fleming is an 75 y.o. female who was admitted 01/19/2016 with a chief complaint of right shoulder pain, and found to have a diagnosis of rotator cuff arthropathy.  They were brought to the operating room on 01/19/2016 and underwent the above named procedures. Did well with PT and transferred to home in good condition POD 1   Antibiotics given:  Anti-infectives    Start     Dose/Rate Route Frequency Ordered Stop   01/19/16 2200  ceFAZolin (ANCEF) IVPB 2 g/50 mL premix     2 g 100 mL/hr over 30 Minutes Intravenous Every 12 hours 01/19/16 1739 01/20/16 1200   01/19/16 1130  ceFAZolin (ANCEF) IVPB 2 g/50 mL premix  Status:  Discontinued     2 g 100 mL/hr over 30 Minutes Intravenous To ShortStay Surgical 01/18/16 1018 01/19/16 1709    .  Recent vital signs:  Filed Vitals:   01/20/16 0507 01/20/16 1131  BP: 107/49 110/55  Pulse: 76   Temp: 97.8 F (36.6 C)   Resp: 18     Recent laboratory studies:  Results for orders placed or performed during the hospital encounter of 01/19/16  Hemoglobin A1c  Result Value Ref Range   Hgb A1c MFr Bld 7.4 (H) 4.8 - 5.6 %   Mean Plasma Glucose 166 mg/dL  Glucose, capillary  Result Value Ref Range   Glucose-Capillary 121 (H) 65 - 99 mg/dL  Glucose, capillary  Result Value Ref Range   Glucose-Capillary 134 (H) 65 - 99 mg/dL   Comment 1 Notify RN   Glucose, capillary  Result Value Ref Range   Glucose-Capillary 258 (H) 65 - 99 mg/dL  Glucose, capillary  Result Value Ref Range   Glucose-Capillary 127 (H) 65 - 99 mg/dL  Glucose, capillary  Result Value Ref  Range   Glucose-Capillary 158 (H) 65 - 99 mg/dL  ABO/Rh  Result Value Ref Range   ABO/RH(D) B POS     Discharge Medications:     Medication List    STOP taking these medications        HYDROcodone-acetaminophen 5-325 MG tablet  Commonly known as:  NORCO/VICODIN      TAKE these medications        amLODipine 10 MG tablet  Commonly known as:  NORVASC  Take 10 mg by mouth daily.     aspirin EC 81 MG tablet  Take 81 mg by mouth every other day.     atorvastatin 20 MG tablet  Commonly known as:  LIPITOR  Take 20 mg by mouth daily.     cyclobenzaprine 10 MG tablet  Commonly known as:  FLEXERIL  Take 10 mg by mouth 3 (three) times daily as needed for muscle spasms.     diclofenac 75 MG EC tablet  Commonly known as:  VOLTAREN  Take 75 mg by mouth 2 (two) times daily as needed for mild pain.     metFORMIN 500 MG tablet  Commonly known as:  GLUCOPHAGE  Take 500 mg by mouth daily.     olmesartan-hydrochlorothiazide 40-25 MG tablet  Commonly known as:  BENICAR HCT  Take 1 tablet by mouth daily.  oxyCODONE 5 MG immediate release tablet  Commonly known as:  Oxy IR/ROXICODONE  Take 1-2 tablets (5-10 mg total) by mouth every 3 (three) hours as needed for breakthrough pain.        Diagnostic Studies: Dg Chest 2 View  01/14/2016  CLINICAL DATA:  Pre op examination for shoulder replacement EXAM: CHEST  2 VIEW COMPARISON:  None. FINDINGS: Cardiomediastinal silhouette is unremarkable. No acute infiltrate or pleural effusion. No pulmonary edema. Mild degenerative changes mid and lower thoracic spine. IMPRESSION: No active cardiopulmonary disease. Mild degenerative changes thoracic spine. Electronically Signed   By: Natasha MeadLiviu  Pop M.D.   On: 01/14/2016 15:03   Ct Shoulder Right Wo Contrast  01/11/2016  CLINICAL DATA:  Primary osteoarthritis of the right shoulder. Reduced range of motion. Fall on 11/17/2015. Preoperative planning. EXAM: CT OF THE RIGHT SHOULDER WITHOUT CONTRAST  TECHNIQUE: Multidetector CT imaging was performed according to the standard protocol. Multiplanar CT image reconstructions were also generated. COMPARISON:  Multiple exams, including 11/17/2015 and 01/04/2016 FINDINGS: Severe loss of the acromiohumeral space compatible with the known full-thickness full width rotator cuff tearing. Faint calcifications project along the supraspinatus and infraspinatus tendons. There is flattening and remodeling of the acromial undersurface to accommodate the humeral head with associated spurring. Accordingly at least some of the rotator car of rupture is thought to be chronic. Mild superior subluxation of the humeral head. AC joint spurring noted inferiorly and possibly predisposing to impingement. Otherwise the acromial undersurface is type 1 (flat) except in the area where it is accommodated to the humerus. There is a 0.9 by 0.4 cm ossific structure just below the coracoid, partially along the attachment site of the short head of the biceps, although I suspect that some of the short head likely attaches to the non-fragmented part of the coracoid as well. As on the MRI, there is a shoulder joint effusion which freely communicates with the subacromial subdeltoid bursa. I do not see a bony Bankart. There is some slight concavity along the posterior superior humeral head, questionable for a Hill-Sachs impaction in the past. Coronary, aortic arch, and branch vessel atherosclerotic vascular disease. IMPRESSION: 1. 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 Joint effusion freely communicates with the subacromial subdeltoid bursa. Faint calcifications along portions of the torn rotator cuff suggest the possibility of a minimal calcific component of the underlying tendinopathy. 2. AC joint spurring inferiorly potentially predisposing to  impingement. 3. Small ossific structure below the coracoid, likely from chronic partial avulsion of the short head of the biceps. 4. Questionable Hill-Sachs at impaction in the past. No bony Bankart. 5. Coronary, aortic arch, and branch vessel atherosclerotic vascular disease. Electronically Signed   By: Gaylyn RongWalter  Liebkemann M.D.   On: 01/11/2016 10:38   Dg Shoulder Right Port  01/19/2016  CLINICAL DATA:  Status post right shoulder replacement EXAM: PORTABLE RIGHT SHOULDER - 2+ VIEW COMPARISON:  None. FINDINGS: Right shoulder prosthesis is noted and appears well seated. No acute bony abnormality is noted. Mild degenerative changes of the acromioclavicular joint are seen. IMPRESSION: Status post right shoulder replacement Electronically Signed   By: Alcide CleverMark  Lukens M.D.   On: 01/19/2016 16:19    Disposition: 01-Home or Self Care      Discharge Instructions    Call MD / Call 911    Complete by:  As directed   If you experience chest  pain or shortness of breath, CALL 911 and be transported to the hospital emergency room.  If you develope a fever above 101 F, pus (white drainage) or increased drainage or redness at the wound, or calf pain, call your surgeon's office.     Constipation Prevention    Complete by:  As directed   Drink plenty of fluids.  Prune juice may be helpful.  You may use a stool softener, such as Colace (over the counter) 100 mg twice a day.  Use MiraLax (over the counter) for constipation as needed.     Diet - low sodium heart healthy    Complete by:  As directed      Discharge instructions    Complete by:  As directed   OK to shower Ok for shoulder range of motion as occupational therapy demonstrated Use sling for comfort     Increase activity slowly as tolerated    Complete by:  As directed               Signed: DEAN,GREGORY SCOTT 02/05/2016, 10:42 AM

## 2016-02-10 DIAGNOSIS — M19011 Primary osteoarthritis, right shoulder: Secondary | ICD-10-CM | POA: Diagnosis not present

## 2016-02-10 DIAGNOSIS — M25511 Pain in right shoulder: Secondary | ICD-10-CM | POA: Diagnosis not present

## 2016-02-17 DIAGNOSIS — N183 Chronic kidney disease, stage 3 (moderate): Secondary | ICD-10-CM | POA: Diagnosis not present

## 2016-02-17 DIAGNOSIS — M19011 Primary osteoarthritis, right shoulder: Secondary | ICD-10-CM | POA: Diagnosis not present

## 2016-02-17 DIAGNOSIS — E7800 Pure hypercholesterolemia, unspecified: Secondary | ICD-10-CM | POA: Diagnosis not present

## 2016-02-17 DIAGNOSIS — E1121 Type 2 diabetes mellitus with diabetic nephropathy: Secondary | ICD-10-CM | POA: Diagnosis not present

## 2016-02-17 DIAGNOSIS — Z1389 Encounter for screening for other disorder: Secondary | ICD-10-CM | POA: Diagnosis not present

## 2016-02-17 DIAGNOSIS — M25511 Pain in right shoulder: Secondary | ICD-10-CM | POA: Diagnosis not present

## 2016-02-17 DIAGNOSIS — I10 Essential (primary) hypertension: Secondary | ICD-10-CM | POA: Diagnosis not present

## 2016-02-17 DIAGNOSIS — Z7984 Long term (current) use of oral hypoglycemic drugs: Secondary | ICD-10-CM | POA: Diagnosis not present

## 2016-02-17 DIAGNOSIS — H6121 Impacted cerumen, right ear: Secondary | ICD-10-CM | POA: Diagnosis not present

## 2016-02-24 DIAGNOSIS — M19011 Primary osteoarthritis, right shoulder: Secondary | ICD-10-CM | POA: Diagnosis not present

## 2016-02-24 DIAGNOSIS — M25511 Pain in right shoulder: Secondary | ICD-10-CM | POA: Diagnosis not present

## 2016-02-26 DIAGNOSIS — N289 Disorder of kidney and ureter, unspecified: Secondary | ICD-10-CM | POA: Diagnosis not present

## 2016-03-02 DIAGNOSIS — M19011 Primary osteoarthritis, right shoulder: Secondary | ICD-10-CM | POA: Diagnosis not present

## 2016-03-02 DIAGNOSIS — M25511 Pain in right shoulder: Secondary | ICD-10-CM | POA: Diagnosis not present

## 2016-03-10 DIAGNOSIS — M19011 Primary osteoarthritis, right shoulder: Secondary | ICD-10-CM | POA: Diagnosis not present

## 2016-03-10 DIAGNOSIS — M25511 Pain in right shoulder: Secondary | ICD-10-CM | POA: Diagnosis not present

## 2016-03-16 DIAGNOSIS — M25511 Pain in right shoulder: Secondary | ICD-10-CM | POA: Diagnosis not present

## 2016-03-16 DIAGNOSIS — M19011 Primary osteoarthritis, right shoulder: Secondary | ICD-10-CM | POA: Diagnosis not present

## 2016-03-24 DIAGNOSIS — M19011 Primary osteoarthritis, right shoulder: Secondary | ICD-10-CM | POA: Diagnosis not present

## 2016-03-24 DIAGNOSIS — M25511 Pain in right shoulder: Secondary | ICD-10-CM | POA: Diagnosis not present

## 2016-04-07 DIAGNOSIS — M19011 Primary osteoarthritis, right shoulder: Secondary | ICD-10-CM | POA: Diagnosis not present

## 2016-04-07 DIAGNOSIS — M25511 Pain in right shoulder: Secondary | ICD-10-CM | POA: Diagnosis not present

## 2016-04-14 DIAGNOSIS — M25511 Pain in right shoulder: Secondary | ICD-10-CM | POA: Diagnosis not present

## 2016-04-14 DIAGNOSIS — M19011 Primary osteoarthritis, right shoulder: Secondary | ICD-10-CM | POA: Diagnosis not present

## 2016-04-28 DIAGNOSIS — M19011 Primary osteoarthritis, right shoulder: Secondary | ICD-10-CM | POA: Diagnosis not present

## 2016-04-28 DIAGNOSIS — M25511 Pain in right shoulder: Secondary | ICD-10-CM | POA: Diagnosis not present

## 2016-05-04 DIAGNOSIS — M19011 Primary osteoarthritis, right shoulder: Secondary | ICD-10-CM | POA: Diagnosis not present

## 2016-05-04 DIAGNOSIS — M25511 Pain in right shoulder: Secondary | ICD-10-CM | POA: Diagnosis not present

## 2016-05-12 DIAGNOSIS — M25511 Pain in right shoulder: Secondary | ICD-10-CM | POA: Diagnosis not present

## 2016-05-12 DIAGNOSIS — M19011 Primary osteoarthritis, right shoulder: Secondary | ICD-10-CM | POA: Diagnosis not present

## 2016-05-26 DIAGNOSIS — M25511 Pain in right shoulder: Secondary | ICD-10-CM | POA: Diagnosis not present

## 2016-05-26 DIAGNOSIS — M19011 Primary osteoarthritis, right shoulder: Secondary | ICD-10-CM | POA: Diagnosis not present

## 2016-09-01 DIAGNOSIS — E669 Obesity, unspecified: Secondary | ICD-10-CM | POA: Diagnosis not present

## 2016-09-01 DIAGNOSIS — D649 Anemia, unspecified: Secondary | ICD-10-CM | POA: Diagnosis not present

## 2016-09-01 DIAGNOSIS — R7989 Other specified abnormal findings of blood chemistry: Secondary | ICD-10-CM | POA: Diagnosis not present

## 2016-09-01 DIAGNOSIS — E119 Type 2 diabetes mellitus without complications: Secondary | ICD-10-CM | POA: Diagnosis not present

## 2016-09-01 DIAGNOSIS — Z0000 Encounter for general adult medical examination without abnormal findings: Secondary | ICD-10-CM | POA: Diagnosis not present

## 2016-09-01 DIAGNOSIS — E785 Hyperlipidemia, unspecified: Secondary | ICD-10-CM | POA: Diagnosis not present

## 2016-09-01 DIAGNOSIS — I10 Essential (primary) hypertension: Secondary | ICD-10-CM | POA: Diagnosis not present

## 2016-09-01 DIAGNOSIS — Z23 Encounter for immunization: Secondary | ICD-10-CM | POA: Diagnosis not present

## 2017-06-21 DIAGNOSIS — D649 Anemia, unspecified: Secondary | ICD-10-CM | POA: Diagnosis not present

## 2017-06-21 DIAGNOSIS — E039 Hypothyroidism, unspecified: Secondary | ICD-10-CM | POA: Diagnosis not present

## 2017-06-21 DIAGNOSIS — I10 Essential (primary) hypertension: Secondary | ICD-10-CM | POA: Diagnosis not present

## 2017-06-21 DIAGNOSIS — E669 Obesity, unspecified: Secondary | ICD-10-CM | POA: Diagnosis not present

## 2017-06-21 DIAGNOSIS — E119 Type 2 diabetes mellitus without complications: Secondary | ICD-10-CM | POA: Diagnosis not present

## 2017-06-21 DIAGNOSIS — E785 Hyperlipidemia, unspecified: Secondary | ICD-10-CM | POA: Diagnosis not present

## 2017-06-24 IMAGING — CT CT SHOULDER*R* W/O CM
3 series · 10 of 14 positions shown, 11 images · non-contrast
Comparison: Multiple exams, including 11/17/2015 and 01/04/2016

CLINICAL DATA: Primary osteoarthritis of the right shoulder.
Reduced range of motion. Fall on 11/17/2015. Preoperative planning.

EXAM:
CT OF THE RIGHT SHOULDER WITHOUT CONTRAST
TECHNIQUE: Multidetector CT imaging was performed according to the standard
protocol. Multiplanar CT image reconstructions were also generated.

[Series 9: shoulder soft · axial · 0.58mm/px · z∈[-231,-36]mm · 3 of 79 slices shown, 4 images]
[im 1/79  soft-tissue]
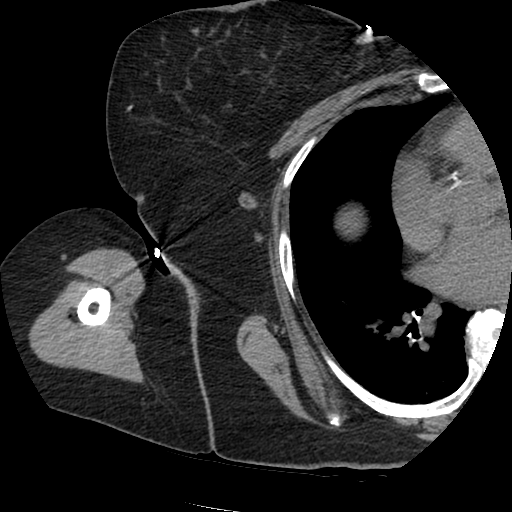
[im 1/79  bone]
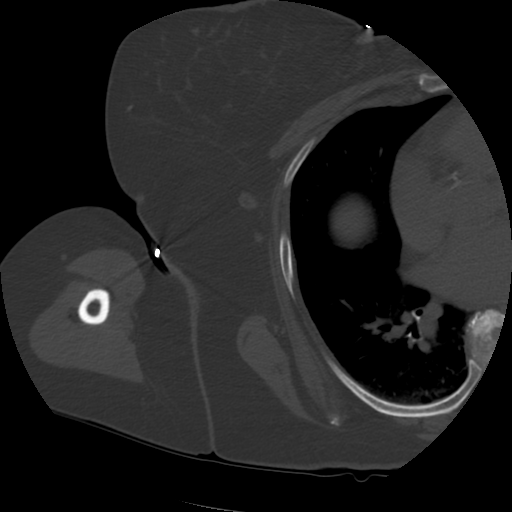
[im 40/79  bone]
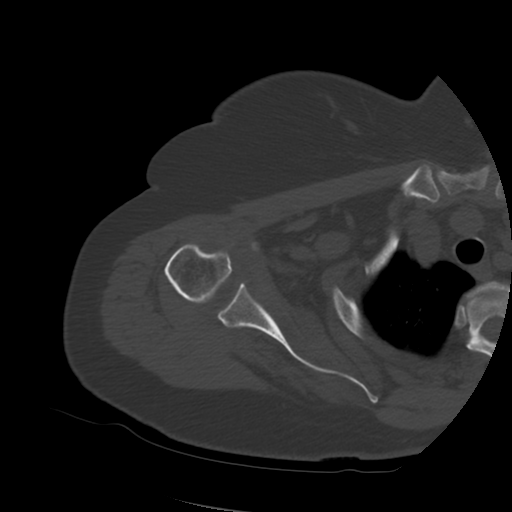
[im 79/79  bone]
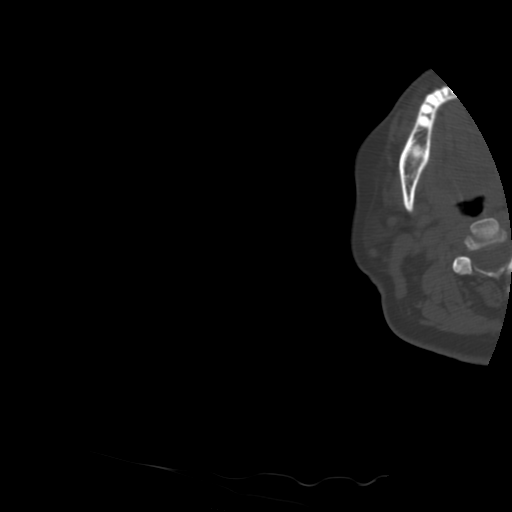

[Series 700: sag soft · oblique · 0.58mm/px · 4 of 129 slices shown]
[im 26/129  soft-tissue]
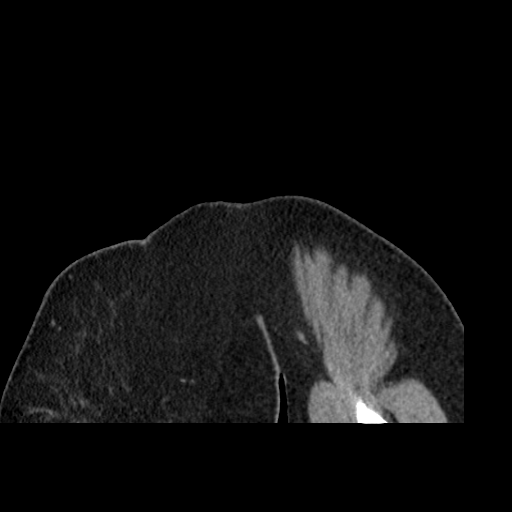
[im 52/129  soft-tissue]
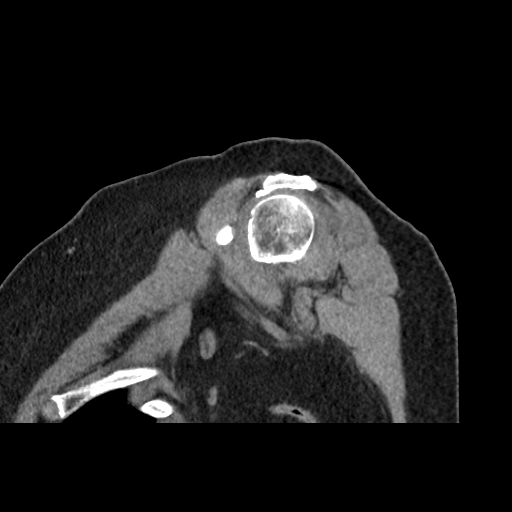
[im 77/129  soft-tissue]
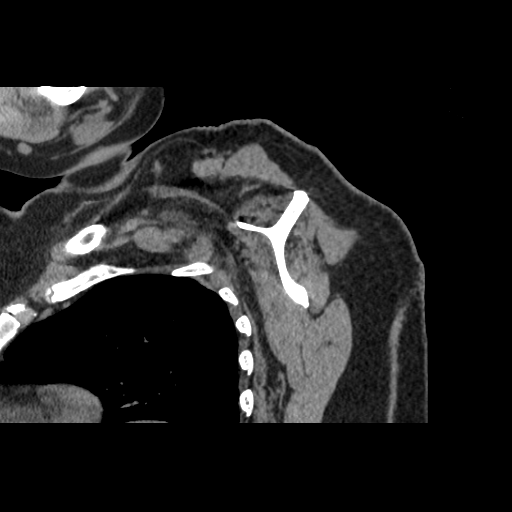
[im 103/129  soft-tissue]
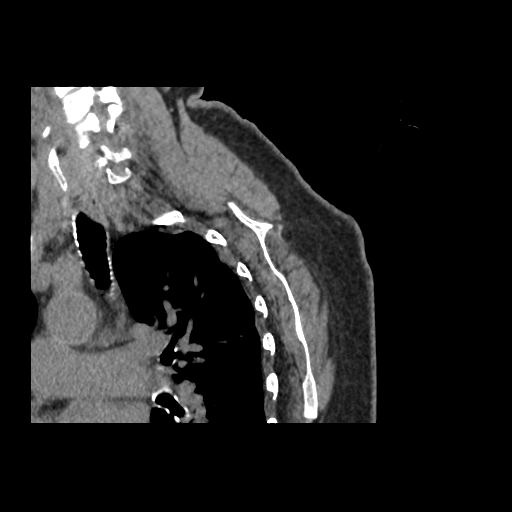

[Series 701: cor soft · coronal · 0.58mm/px · 3 of 116 slices shown]
[im 29/116  soft-tissue]
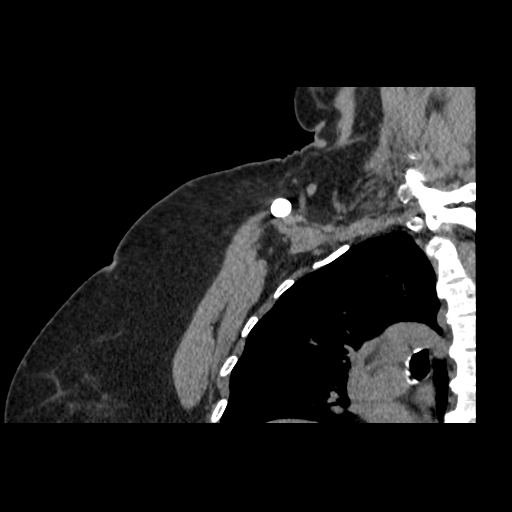
[im 58/116  soft-tissue]
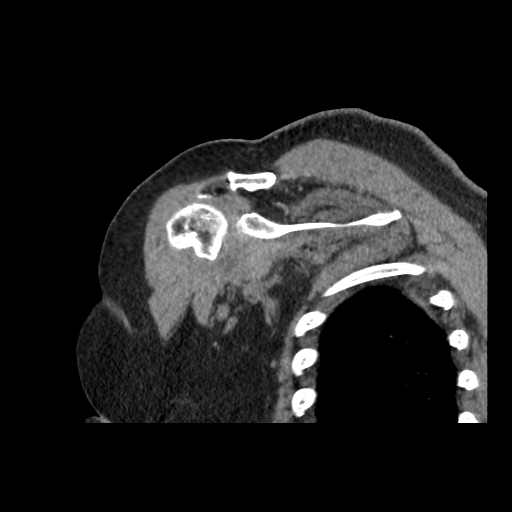
[im 87/116  soft-tissue]
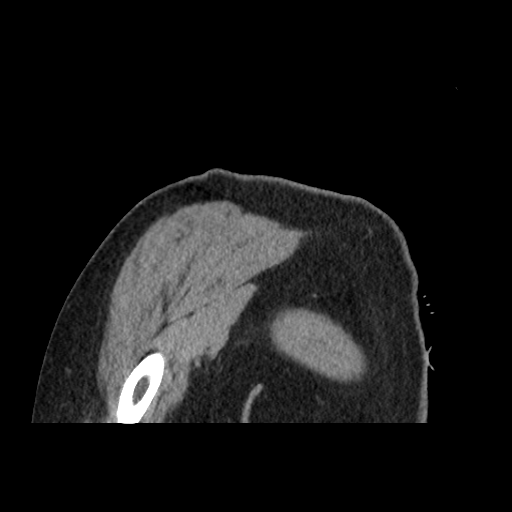

[10 of 14 positions shown; findings below may reference images not displayed]

FINDINGS: Severe loss of the acromiohumeral space compatible with the known
full-thickness full width rotator cuff tearing. Faint calcifications
project along the supraspinatus and infraspinatus tendons. There is
flattening and remodeling of the acromial undersurface to
accommodate the humeral head with associated spurring. Accordingly
at least some of the rotator car of rupture is thought to be
chronic. Mild superior subluxation of the humeral head.

AC joint spurring noted inferiorly and possibly predisposing to
impingement. Otherwise the acromial undersurface is type 1 (flat)
except in the area where it is accommodated to the humerus.

There is a 0.9 by 0.4 cm ossific structure just below the coracoid,
partially along the attachment site of the short head of the biceps,
although I suspect that some of the short head likely attaches to
the non-fragmented part of the coracoid as well.

As on the MRI, there is a shoulder joint effusion which freely
communicates with the subacromial subdeltoid bursa.

I do not see a bony Bankart. There is some slight concavity along
the posterior superior humeral head, questionable for a Hill-Sachs
impaction in the past.

Coronary, aortic arch, and branch vessel atherosclerotic vascular
disease.
IMPRESSION: 1. Extensive broke a tear cuff tearing as shown on recent MRI, with
accommodation of the acromion to the superiorly subluxed humeral
head indicating some degree of chronic rotator cuff rupture. Joint
effusion freely communicates with the subacromial subdeltoid bursa.
Faint calcifications along portions of the torn rotator cuff suggest
the possibility of a minimal calcific component of the underlying
tendinopathy.
2. AC joint spurring inferiorly potentially predisposing to
impingement.
3. Small ossific structure below the coracoid, likely from chronic
partial avulsion of the short head of the biceps.
4. Questionable Hill-Sachs at impaction in the past. No bony
Bankart.
5. Coronary, aortic arch, and branch vessel atherosclerotic vascular
disease.

## 2017-06-27 IMAGING — CR DG CHEST 2V
2 series · 2 of 2 positions shown · non-contrast
Comparison: None.

CLINICAL DATA: Pre op examination for shoulder replacement

EXAM:
CHEST  2 VIEW

[w chest pa]
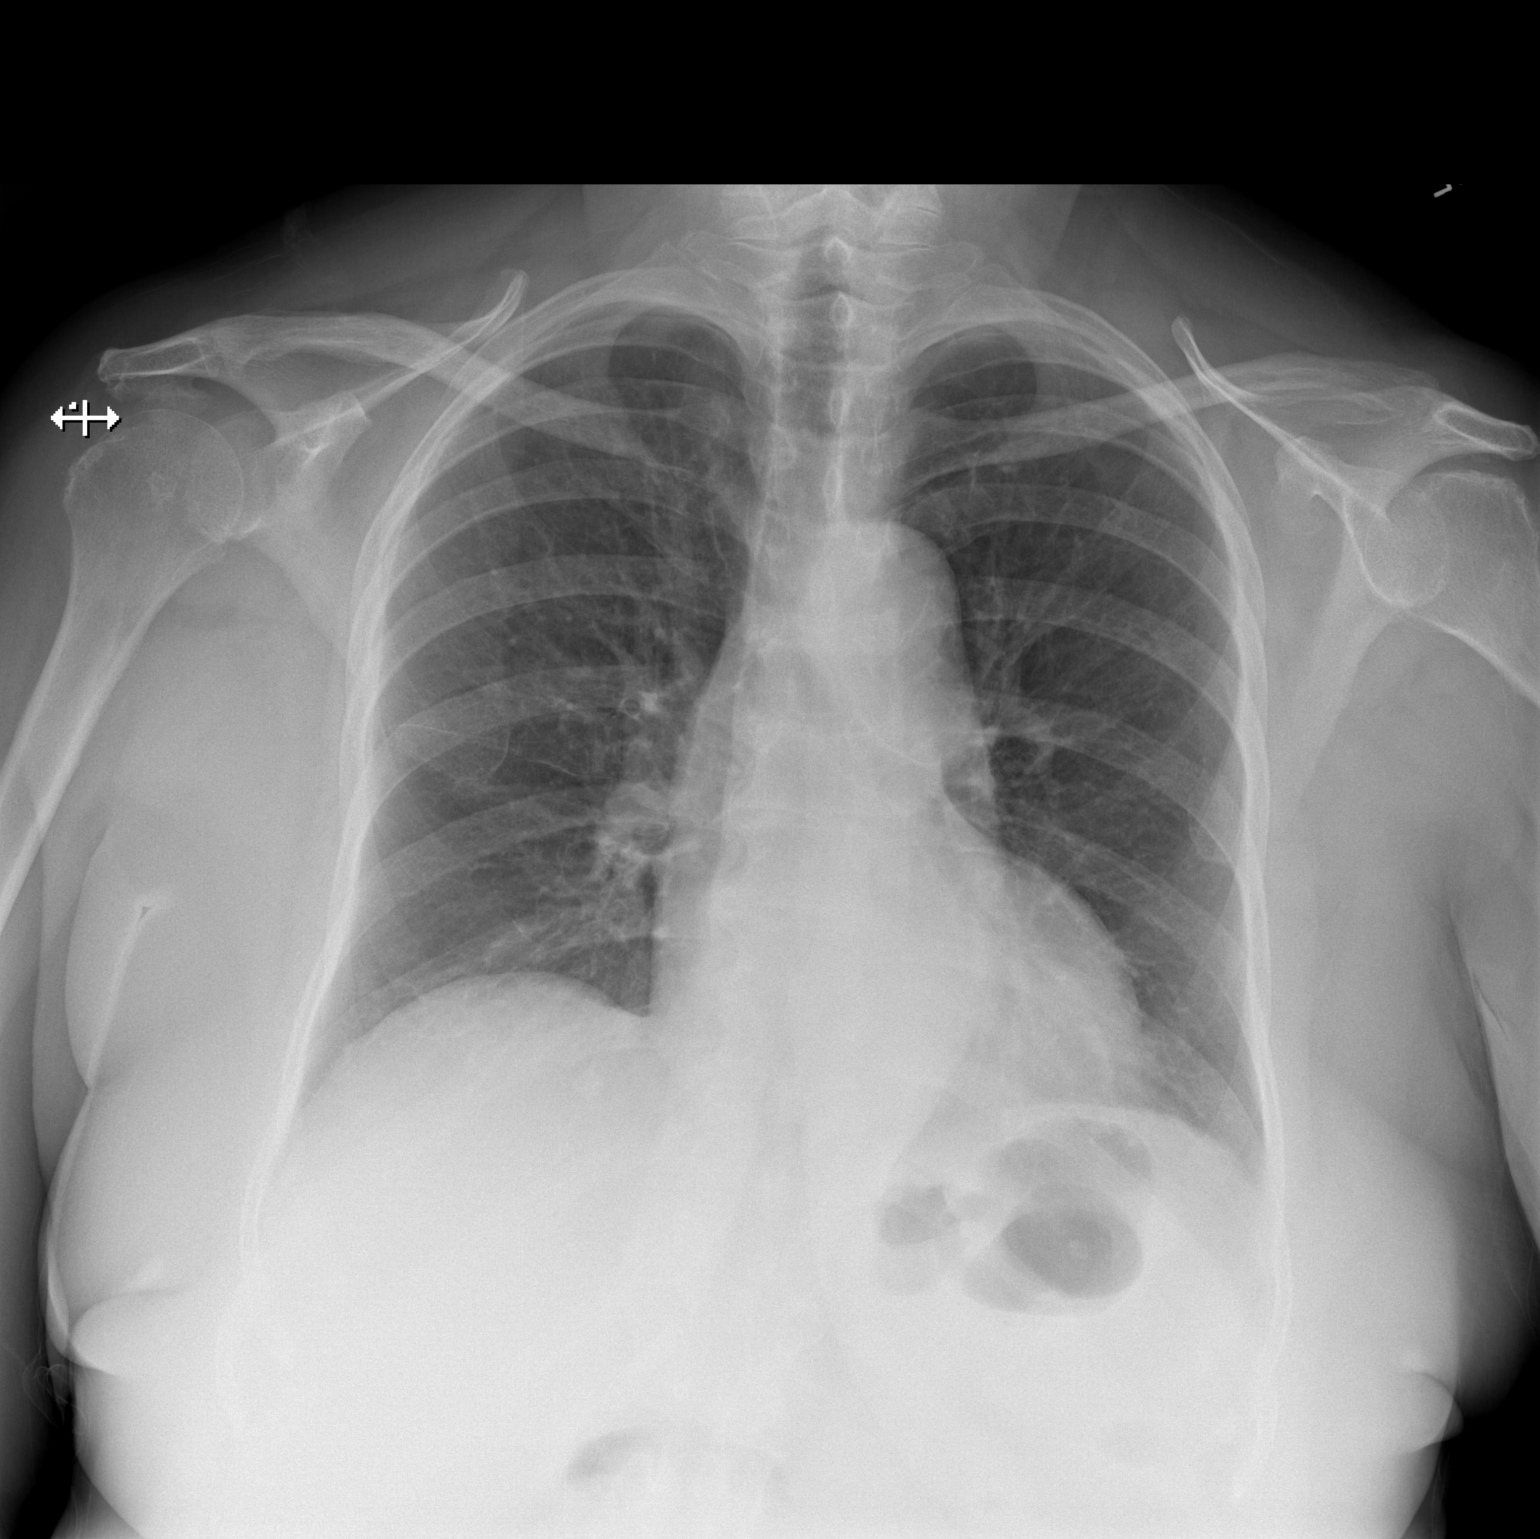

[w chest lat]
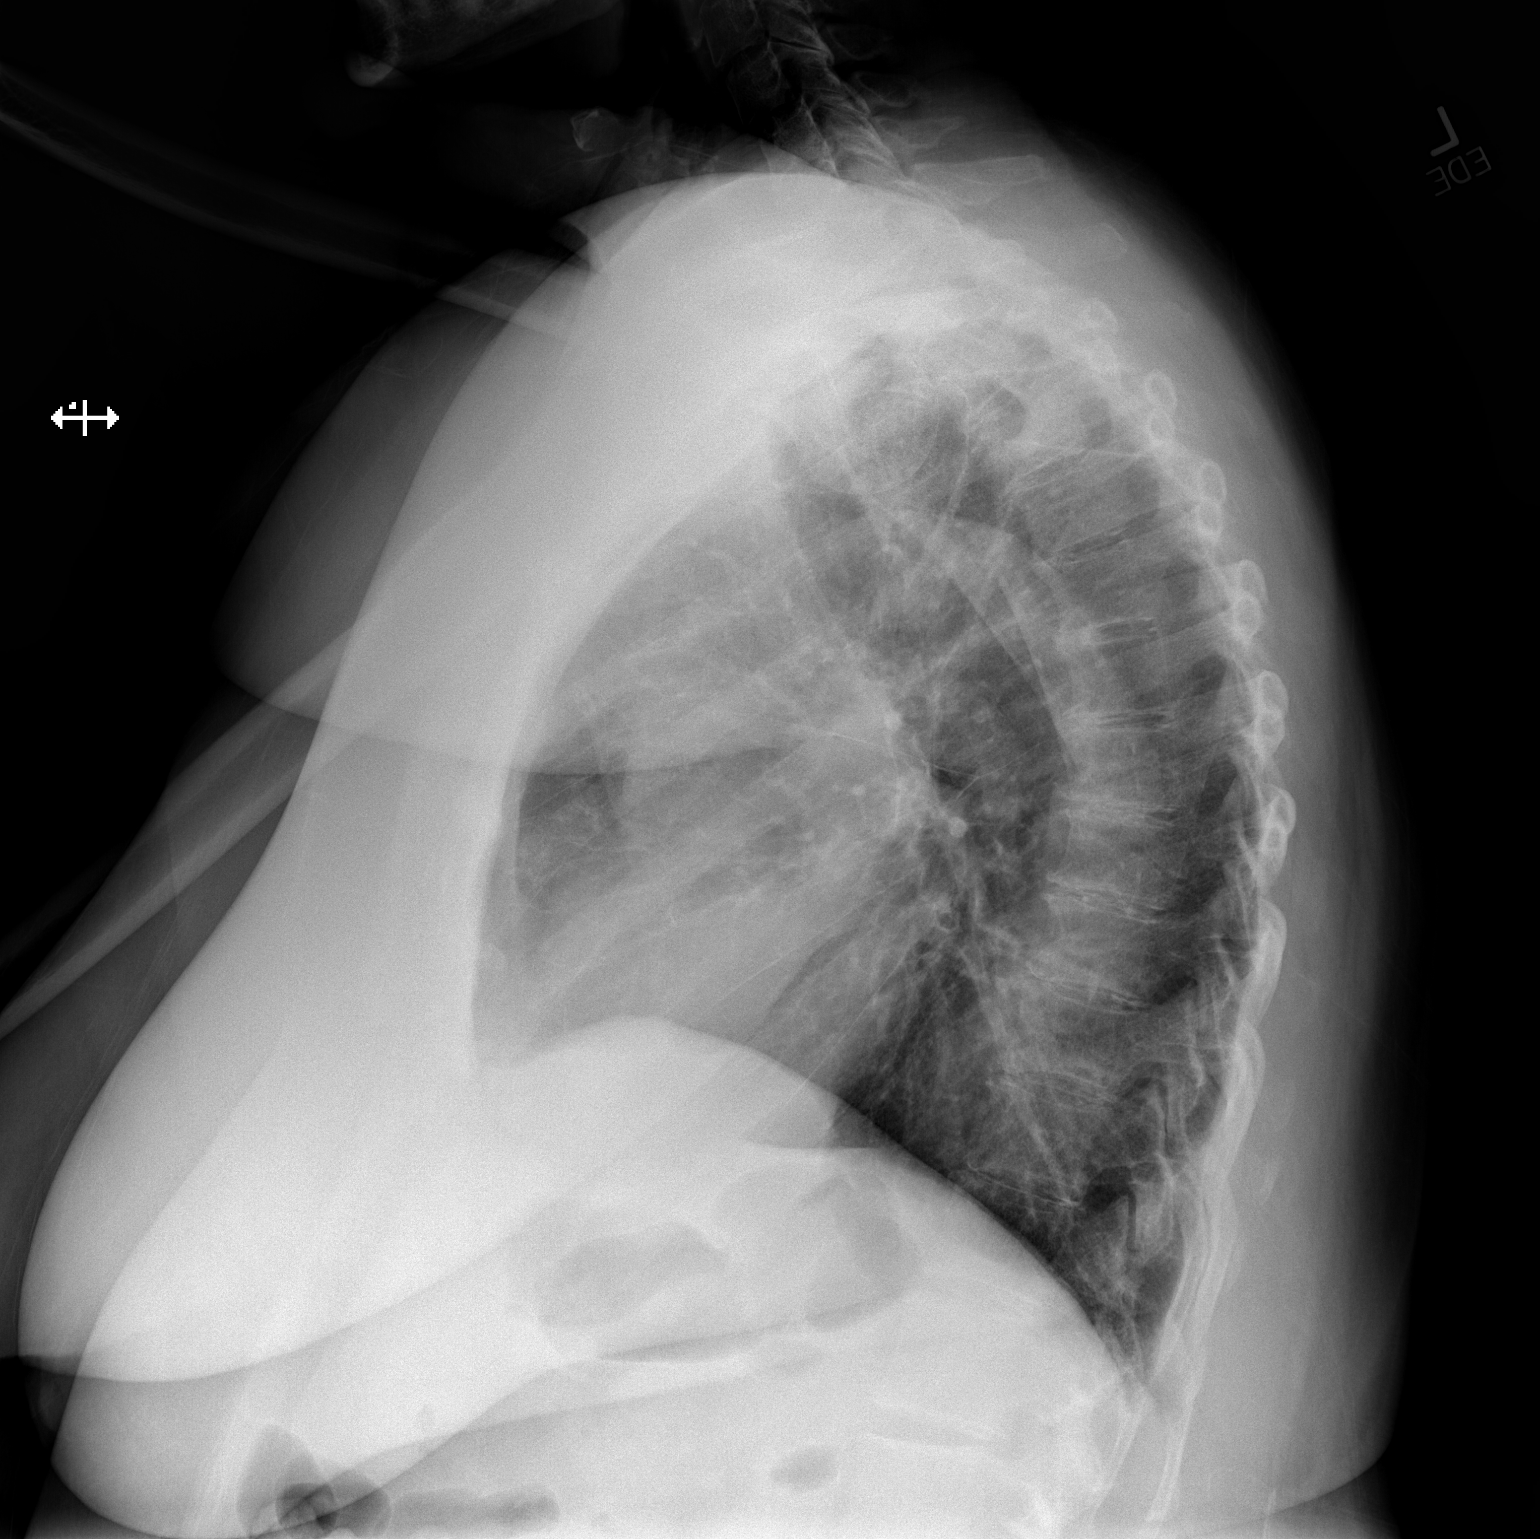

[2 of 2 positions shown; findings below may reference images not displayed]

FINDINGS: Cardiomediastinal silhouette is unremarkable. No acute infiltrate or
pleural effusion. No pulmonary edema. Mild degenerative changes mid
and lower thoracic spine.
IMPRESSION: No active cardiopulmonary disease. Mild degenerative changes
thoracic spine.

## 2017-06-28 DIAGNOSIS — E785 Hyperlipidemia, unspecified: Secondary | ICD-10-CM | POA: Diagnosis not present

## 2017-06-28 DIAGNOSIS — E119 Type 2 diabetes mellitus without complications: Secondary | ICD-10-CM | POA: Diagnosis not present

## 2017-06-28 DIAGNOSIS — I10 Essential (primary) hypertension: Secondary | ICD-10-CM | POA: Diagnosis not present

## 2017-10-04 DIAGNOSIS — D649 Anemia, unspecified: Secondary | ICD-10-CM | POA: Diagnosis not present

## 2017-10-04 DIAGNOSIS — Z0000 Encounter for general adult medical examination without abnormal findings: Secondary | ICD-10-CM | POA: Diagnosis not present

## 2017-10-04 DIAGNOSIS — R5383 Other fatigue: Secondary | ICD-10-CM | POA: Diagnosis not present

## 2017-10-04 DIAGNOSIS — R7989 Other specified abnormal findings of blood chemistry: Secondary | ICD-10-CM | POA: Diagnosis not present

## 2017-10-04 DIAGNOSIS — E039 Hypothyroidism, unspecified: Secondary | ICD-10-CM | POA: Diagnosis not present

## 2018-07-13 DIAGNOSIS — E119 Type 2 diabetes mellitus without complications: Secondary | ICD-10-CM | POA: Diagnosis not present

## 2018-07-13 DIAGNOSIS — I10 Essential (primary) hypertension: Secondary | ICD-10-CM | POA: Diagnosis not present

## 2018-07-13 DIAGNOSIS — R7989 Other specified abnormal findings of blood chemistry: Secondary | ICD-10-CM | POA: Diagnosis not present

## 2018-07-13 DIAGNOSIS — E669 Obesity, unspecified: Secondary | ICD-10-CM | POA: Diagnosis not present

## 2018-07-13 DIAGNOSIS — E785 Hyperlipidemia, unspecified: Secondary | ICD-10-CM | POA: Diagnosis not present

## 2018-07-13 DIAGNOSIS — E039 Hypothyroidism, unspecified: Secondary | ICD-10-CM | POA: Diagnosis not present

## 2021-02-02 DIAGNOSIS — I10 Essential (primary) hypertension: Secondary | ICD-10-CM | POA: Diagnosis not present

## 2021-02-02 DIAGNOSIS — E782 Mixed hyperlipidemia: Secondary | ICD-10-CM | POA: Diagnosis not present

## 2021-02-02 DIAGNOSIS — E119 Type 2 diabetes mellitus without complications: Secondary | ICD-10-CM | POA: Diagnosis not present

## 2021-08-05 DIAGNOSIS — I10 Essential (primary) hypertension: Secondary | ICD-10-CM | POA: Diagnosis not present

## 2021-08-05 DIAGNOSIS — Z0000 Encounter for general adult medical examination without abnormal findings: Secondary | ICD-10-CM | POA: Diagnosis not present

## 2021-08-05 DIAGNOSIS — E119 Type 2 diabetes mellitus without complications: Secondary | ICD-10-CM | POA: Diagnosis not present

## 2021-08-27 DIAGNOSIS — Z23 Encounter for immunization: Secondary | ICD-10-CM | POA: Diagnosis not present

## 2022-02-02 DIAGNOSIS — I10 Essential (primary) hypertension: Secondary | ICD-10-CM | POA: Diagnosis not present

## 2022-02-02 DIAGNOSIS — N183 Chronic kidney disease, stage 3 unspecified: Secondary | ICD-10-CM | POA: Diagnosis not present

## 2022-02-02 DIAGNOSIS — E7800 Pure hypercholesterolemia, unspecified: Secondary | ICD-10-CM | POA: Diagnosis not present

## 2022-02-02 DIAGNOSIS — Z7984 Long term (current) use of oral hypoglycemic drugs: Secondary | ICD-10-CM | POA: Diagnosis not present

## 2022-02-02 DIAGNOSIS — E1165 Type 2 diabetes mellitus with hyperglycemia: Secondary | ICD-10-CM | POA: Diagnosis not present

## 2022-02-02 DIAGNOSIS — E1121 Type 2 diabetes mellitus with diabetic nephropathy: Secondary | ICD-10-CM | POA: Diagnosis not present

## 2022-03-16 DIAGNOSIS — D649 Anemia, unspecified: Secondary | ICD-10-CM | POA: Diagnosis not present

## 2022-08-09 DIAGNOSIS — I10 Essential (primary) hypertension: Secondary | ICD-10-CM | POA: Diagnosis not present

## 2022-08-09 DIAGNOSIS — Z23 Encounter for immunization: Secondary | ICD-10-CM | POA: Diagnosis not present

## 2022-08-09 DIAGNOSIS — N1832 Chronic kidney disease, stage 3b: Secondary | ICD-10-CM | POA: Diagnosis not present

## 2022-08-09 DIAGNOSIS — Z0000 Encounter for general adult medical examination without abnormal findings: Secondary | ICD-10-CM | POA: Diagnosis not present

## 2022-08-09 DIAGNOSIS — E669 Obesity, unspecified: Secondary | ICD-10-CM | POA: Diagnosis not present

## 2022-08-09 DIAGNOSIS — D649 Anemia, unspecified: Secondary | ICD-10-CM | POA: Diagnosis not present

## 2022-08-09 DIAGNOSIS — E7800 Pure hypercholesterolemia, unspecified: Secondary | ICD-10-CM | POA: Diagnosis not present

## 2022-08-09 DIAGNOSIS — E1165 Type 2 diabetes mellitus with hyperglycemia: Secondary | ICD-10-CM | POA: Diagnosis not present

## 2022-08-23 DIAGNOSIS — N1832 Chronic kidney disease, stage 3b: Secondary | ICD-10-CM | POA: Diagnosis not present

## 2022-08-29 DIAGNOSIS — N1832 Chronic kidney disease, stage 3b: Secondary | ICD-10-CM | POA: Diagnosis not present

## 2022-08-29 DIAGNOSIS — E1122 Type 2 diabetes mellitus with diabetic chronic kidney disease: Secondary | ICD-10-CM | POA: Diagnosis not present

## 2022-08-29 DIAGNOSIS — I10 Essential (primary) hypertension: Secondary | ICD-10-CM | POA: Diagnosis not present

## 2022-09-30 ENCOUNTER — Other Ambulatory Visit: Payer: Self-pay | Admitting: Nephrology

## 2022-09-30 DIAGNOSIS — N189 Chronic kidney disease, unspecified: Secondary | ICD-10-CM

## 2022-09-30 DIAGNOSIS — E875 Hyperkalemia: Secondary | ICD-10-CM

## 2022-09-30 DIAGNOSIS — N1832 Chronic kidney disease, stage 3b: Secondary | ICD-10-CM

## 2022-09-30 DIAGNOSIS — E1122 Type 2 diabetes mellitus with diabetic chronic kidney disease: Secondary | ICD-10-CM

## 2022-09-30 DIAGNOSIS — N390 Urinary tract infection, site not specified: Secondary | ICD-10-CM | POA: Diagnosis not present

## 2022-09-30 DIAGNOSIS — D631 Anemia in chronic kidney disease: Secondary | ICD-10-CM | POA: Diagnosis not present

## 2022-09-30 DIAGNOSIS — I129 Hypertensive chronic kidney disease with stage 1 through stage 4 chronic kidney disease, or unspecified chronic kidney disease: Secondary | ICD-10-CM | POA: Diagnosis not present

## 2022-10-04 ENCOUNTER — Ambulatory Visit
Admission: RE | Admit: 2022-10-04 | Discharge: 2022-10-04 | Disposition: A | Discharge status: Home / Self Care | Payer: Medicare Other | Source: Ambulatory Visit | Attending: Nephrology | Admitting: Nephrology

## 2022-10-04 DIAGNOSIS — I129 Hypertensive chronic kidney disease with stage 1 through stage 4 chronic kidney disease, or unspecified chronic kidney disease: Secondary | ICD-10-CM

## 2022-10-04 DIAGNOSIS — N1832 Chronic kidney disease, stage 3b: Secondary | ICD-10-CM

## 2022-10-04 DIAGNOSIS — I10 Essential (primary) hypertension: Secondary | ICD-10-CM | POA: Diagnosis not present

## 2022-10-04 DIAGNOSIS — N281 Cyst of kidney, acquired: Secondary | ICD-10-CM | POA: Diagnosis not present

## 2022-10-04 DIAGNOSIS — E875 Hyperkalemia: Secondary | ICD-10-CM

## 2022-10-04 DIAGNOSIS — N189 Chronic kidney disease, unspecified: Secondary | ICD-10-CM

## 2022-10-04 DIAGNOSIS — N2889 Other specified disorders of kidney and ureter: Secondary | ICD-10-CM | POA: Diagnosis not present

## 2022-10-04 DIAGNOSIS — E1122 Type 2 diabetes mellitus with diabetic chronic kidney disease: Secondary | ICD-10-CM

## 2022-10-10 DIAGNOSIS — I10 Essential (primary) hypertension: Secondary | ICD-10-CM | POA: Diagnosis not present

## 2022-10-10 DIAGNOSIS — N1832 Chronic kidney disease, stage 3b: Secondary | ICD-10-CM | POA: Diagnosis not present

## 2022-10-11 ENCOUNTER — Other Ambulatory Visit: Payer: Self-pay | Admitting: Nephrology

## 2022-10-11 DIAGNOSIS — N2889 Other specified disorders of kidney and ureter: Secondary | ICD-10-CM

## 2022-10-31 DIAGNOSIS — N1832 Chronic kidney disease, stage 3b: Secondary | ICD-10-CM | POA: Diagnosis not present

## 2022-11-17 ENCOUNTER — Ambulatory Visit
Admission: RE | Admit: 2022-11-17 | Discharge: 2022-11-17 | Disposition: A | Discharge status: Home / Self Care | Payer: Medicare Other | Source: Ambulatory Visit | Attending: Nephrology | Admitting: Nephrology

## 2022-11-17 ENCOUNTER — Other Ambulatory Visit: Payer: Self-pay | Admitting: Nephrology

## 2022-11-17 DIAGNOSIS — N2889 Other specified disorders of kidney and ureter: Secondary | ICD-10-CM

## 2022-11-17 DIAGNOSIS — N281 Cyst of kidney, acquired: Secondary | ICD-10-CM | POA: Diagnosis not present

## 2022-11-17 DIAGNOSIS — K449 Diaphragmatic hernia without obstruction or gangrene: Secondary | ICD-10-CM | POA: Diagnosis not present

## 2022-11-17 DIAGNOSIS — D259 Leiomyoma of uterus, unspecified: Secondary | ICD-10-CM | POA: Diagnosis not present

## 2022-12-14 DIAGNOSIS — E559 Vitamin D deficiency, unspecified: Secondary | ICD-10-CM | POA: Diagnosis not present

## 2023-02-01 DIAGNOSIS — D631 Anemia in chronic kidney disease: Secondary | ICD-10-CM | POA: Diagnosis not present

## 2023-02-01 DIAGNOSIS — E1122 Type 2 diabetes mellitus with diabetic chronic kidney disease: Secondary | ICD-10-CM | POA: Diagnosis not present

## 2023-02-01 DIAGNOSIS — N2889 Other specified disorders of kidney and ureter: Secondary | ICD-10-CM | POA: Diagnosis not present

## 2023-02-01 DIAGNOSIS — I129 Hypertensive chronic kidney disease with stage 1 through stage 4 chronic kidney disease, or unspecified chronic kidney disease: Secondary | ICD-10-CM | POA: Diagnosis not present

## 2023-02-01 DIAGNOSIS — E875 Hyperkalemia: Secondary | ICD-10-CM | POA: Diagnosis not present

## 2023-02-01 DIAGNOSIS — N1832 Chronic kidney disease, stage 3b: Secondary | ICD-10-CM | POA: Diagnosis not present

## 2023-02-01 DIAGNOSIS — N189 Chronic kidney disease, unspecified: Secondary | ICD-10-CM | POA: Diagnosis not present

## 2023-02-01 DIAGNOSIS — R609 Edema, unspecified: Secondary | ICD-10-CM | POA: Diagnosis not present

## 2023-02-07 DIAGNOSIS — R8271 Bacteriuria: Secondary | ICD-10-CM | POA: Diagnosis not present

## 2023-02-07 DIAGNOSIS — C641 Malignant neoplasm of right kidney, except renal pelvis: Secondary | ICD-10-CM | POA: Diagnosis not present

## 2023-02-07 DIAGNOSIS — N183 Chronic kidney disease, stage 3 unspecified: Secondary | ICD-10-CM | POA: Diagnosis not present

## 2023-02-08 ENCOUNTER — Other Ambulatory Visit: Payer: Self-pay | Admitting: Urology

## 2023-02-08 DIAGNOSIS — C641 Malignant neoplasm of right kidney, except renal pelvis: Secondary | ICD-10-CM

## 2023-02-27 ENCOUNTER — Ambulatory Visit
Admission: RE | Admit: 2023-02-27 | Discharge: 2023-02-27 | Disposition: A | Discharge status: Home / Self Care | Payer: Medicare Other | Source: Ambulatory Visit | Attending: Urology | Admitting: Urology

## 2023-02-27 DIAGNOSIS — C641 Malignant neoplasm of right kidney, except renal pelvis: Secondary | ICD-10-CM | POA: Diagnosis not present

## 2023-02-27 MED ORDER — GADOPICLENOL 0.5 MMOL/ML IV SOLN
10.0000 mL | Freq: Once | INTRAVENOUS | Status: AC | PRN
  Administered 2023-02-27: 10 mL via INTRAVENOUS

## 2023-03-01 DIAGNOSIS — N2581 Secondary hyperparathyroidism of renal origin: Secondary | ICD-10-CM | POA: Diagnosis not present

## 2023-03-01 DIAGNOSIS — I10 Essential (primary) hypertension: Secondary | ICD-10-CM | POA: Diagnosis not present

## 2023-03-01 DIAGNOSIS — C649 Malignant neoplasm of unspecified kidney, except renal pelvis: Secondary | ICD-10-CM | POA: Diagnosis not present

## 2023-03-01 DIAGNOSIS — N184 Chronic kidney disease, stage 4 (severe): Secondary | ICD-10-CM | POA: Diagnosis not present

## 2023-03-01 DIAGNOSIS — E7800 Pure hypercholesterolemia, unspecified: Secondary | ICD-10-CM | POA: Diagnosis not present

## 2023-03-01 DIAGNOSIS — E559 Vitamin D deficiency, unspecified: Secondary | ICD-10-CM | POA: Diagnosis not present

## 2023-03-01 DIAGNOSIS — E1121 Type 2 diabetes mellitus with diabetic nephropathy: Secondary | ICD-10-CM | POA: Diagnosis not present

## 2023-03-13 ENCOUNTER — Other Ambulatory Visit: Payer: Self-pay | Admitting: Urology

## 2023-03-13 DIAGNOSIS — C641 Malignant neoplasm of right kidney, except renal pelvis: Secondary | ICD-10-CM | POA: Diagnosis not present

## 2023-03-14 NOTE — Patient Instructions (Signed)
SURGICAL WAITING ROOM VISITATION  Patients having surgery or a procedure may have no more than 2 support people in the waiting area - these visitors may rotate.    Children under the age of 45 must have an adult with them who is not the patient.  Due to an increase in RSV and influenza rates and associated hospitalizations, children ages 60 and under may not visit patients in [facility] hospitals.  If the patient needs to stay at the hospital during part of their recovery, the visitor guidelines for inpatient rooms apply. Pre-op nurse will coordinate an appropriate time for 1 support person to accompany patient in pre-op.  This support person may not rotate.    Please refer to the [facility] website for the visitor guidelines for Inpatients (after your surgery is over and you are in a regular room).       Your procedure is scheduled on: 03-29-23   Report to [facility] Main Entrance    Report to admitting at 9:45 AM   Call this number if you have problems the morning of surgery [phone]   Do not eat food or drink liquids:After Midnight.          If you have questions, please contact your surgeon's office.   FOLLOW  ANY ADDITIONAL PRE OP INSTRUCTIONS YOU RECEIVED FROM YOUR SURGEON'S OFFICE!!!     Oral Hygiene is also important to reduce your risk of infection.                                    Remember - BRUSH YOUR TEETH THE MORNING OF SURGERY WITH YOUR REGULAR TOOTHPASTE  DENTURES WILL BE REMOVED PRIOR TO SURGERY PLEASE DO NOT APPLY "Poly grip" OR ADHESIVES!!!   Do NOT smoke after Midnight   Take these medicines the morning of surgery with A SIP OF WATER:   Amlodipine How to Manage Your Diabetes Before and After Surgery  Why is it important to control my blood sugar before and after surgery? Improving blood sugar levels before and after surgery helps healing and can limit problems. A way of improving blood sugar control is eating a healthy diet by:  Eating  less sugar and carbohydrates  Increasing activity/exercise  Talking with your doctor about reaching your blood sugar goals High blood sugars (greater than 180 mg/dL) can raise your risk of infections and slow your recovery, so you will need to focus on controlling your diabetes during the weeks before surgery. Make sure that the doctor who takes care of your diabetes knows about your planned surgery including the date and location.  How do I manage my blood sugar before surgery? Check your blood sugar at least 4 times a day, starting 2 days before surgery, to make sure that the level is not too high or low. Check your blood sugar the morning of your surgery when you wake up and every 2 hours until you get to the Short Stay unit. If your blood sugar is less than 70 mg/dL, you will need to treat for low blood sugar: Do not take insulin. Treat a low blood sugar (less than 70 mg/dL) with  cup of clear juice (cranberry or apple), 4 glucose tablets, OR glucose gel. Recheck blood sugar in 15 minutes after treatment (to make sure it is greater than 70 mg/dL). If your blood sugar is not greater than 70 mg/dL on recheck, call [phone] for  further instructions. Report your blood sugar to the short stay nurse when you get to Short Stay.  If you are admitted to the hospital after surgery: Your blood sugar will be checked by the staff and you will probably be given insulin after surgery (instead of oral diabetes medicines) to make sure you have good blood sugar levels. The goal for blood sugar control after surgery is 80-180 mg/dL.   WHAT DO I DO ABOUT MY DIABETES MEDICATION?  Do not take oral diabetes medicines (pills) the morning of surgery.         Hold Farxiga 3 days before surgery (do not take after 03-25-23)   Reviewed and Endorsed by [facility] Patient Education Committee, August 2015  Bring CPAP mask and tubing day of surgery.                              You may not have any metal on  your body including hair pins, jewelry, and body piercing             Do not wear make-up, lotions, powders, perfumes or deodorant  Do not wear nail polish including gel and S&S, artificial/acrylic nails, or any other type of covering on natural nails including finger and toenails. If you have artificial nails, gel coating, etc. that needs to be removed by a nail salon please have this removed prior to surgery or surgery may need to be canceled/ delayed if the surgeon/ anesthesia feels like they are unable to be safely monitored.   Do not shave  48 hours prior to surgery.        Do not bring valuables to the hospital. [place] IS NOT RESPONSIBLE   FOR VALUABLES.   Contacts, glasses, dentures or bridgework may not be worn into surgery.   Bring small overnight bag day of surgery.   DO NOT BRING YOUR HOME MEDICATIONS TO THE HOSPITAL. PHARMACY WILL DISPENSE MEDICATIONS LISTED ON YOUR MEDICATION LIST TO YOU DURING YOUR ADMISSION IN THE HOSPITAL!     Special Instructions: Bring a copy of your healthcare power of attorney and living will documents the day of surgery if you haven't scanned them before.              Please read over the following fact sheets you were given: IF YOU HAVE QUESTIONS ABOUT YOUR PRE-OP INSTRUCTIONS PLEASE CALL [phone] [place]   If you received a COVID test during your pre-op visit  it is requested that you wear a mask when out in public, stay away from anyone that may not be feeling well and notify your surgeon if you develop symptoms. If you test positive for Covid or have been in contact with anyone that has tested positive in the last 10 days please notify you surgeon.    [facility] - Preparing for Surgery Before surgery, you can play an important role.  Because skin is not sterile, your skin needs to be as free of germs as possible.  You can reduce the number of germs on your skin by washing with CHG (chlorahexidine gluconate) soap before surgery.  CHG is an  antiseptic cleaner which kills germs and bonds with the skin to continue killing germs even after washing. Please DO NOT use if you have an allergy to CHG or antibacterial soaps.  If your skin becomes reddened/irritated stop using the CHG and inform your nurse when you arrive at Short Stay. Do not shave (  including legs and underarms) for at least 48 hours prior to the first CHG shower.  You may shave your face/neck.  Please follow these instructions carefully:  1.  Shower with CHG Soap the night before surgery and the  morning of surgery.  2.  If you choose to wash your hair, wash your hair first as usual with your normal  shampoo.  3.  After you shampoo, rinse your hair and body thoroughly to remove the shampoo.                             4.  Use CHG as you would any other liquid soap.  You can apply chg directly to the skin and wash.  Gently with a scrungie or clean washcloth.  5.  Apply the CHG Soap to your body ONLY FROM THE NECK DOWN.   Do   not use on face/ open                           Wound or open sores. Avoid contact with eyes, ears mouth and   genitals (private parts).                       Wash face,  Genitals (private parts) with your normal soap.             6.  Wash thoroughly, paying special attention to the area where your    surgery  will be performed.  7.  Thoroughly rinse your body with warm water from the neck down.  8.  DO NOT shower/wash with your normal soap after using and rinsing off the CHG Soap.                9.  Pat yourself dry with a clean towel.            10.  Wear clean pajamas.            11.  Place clean sheets on your bed the night of your first shower and do not  sleep with pets. Day of Surgery : Do not apply any lotions/deodorants the morning of surgery.  Please wear clean clothes to the hospital/surgery center.  FAILURE TO FOLLOW THESE INSTRUCTIONS MAY RESULT IN THE CANCELLATION OF YOUR SURGERY  PATIENT  SIGNATURE_________________________________  NURSE SIGNATURE__________________________________  ________________________________________________________________________ WHAT IS A BLOOD TRANSFUSION? Blood Transfusion Information  A transfusion is the replacement of blood or some of its parts. Blood is made up of multiple cells which provide different functions. Red blood cells carry oxygen and are used for blood loss replacement. White blood cells fight against infection. Platelets control bleeding. Plasma helps clot blood. Other blood products are available for specialized needs, such as hemophilia or other clotting disorders. BEFORE THE TRANSFUSION  Who gives blood for transfusions?  Healthy volunteers who are fully evaluated to make sure their blood is safe. This is blood bank blood. Transfusion therapy is the safest it has ever been in the practice of medicine. Before blood is taken from a donor, a complete history is taken to make sure that person has no history of diseases nor engages in risky social behavior (examples are intravenous drug use or sexual activity with multiple partners). The donor's travel history is screened to minimize risk of transmitting infections, such as malaria. The donated blood is tested for signs of infectious diseases, such as HIV  and hepatitis. The blood is then tested to be sure it is compatible with you in order to minimize the chance of a transfusion reaction. If you or a relative donates blood, this is often done in anticipation of surgery and is not appropriate for emergency situations. It takes many days to process the donated blood. RISKS AND COMPLICATIONS Although transfusion therapy is very safe and saves many lives, the main dangers of transfusion include:  Getting an infectious disease. Developing a transfusion reaction. This is an allergic reaction to something in the blood you were given. Every precaution is taken to prevent this. The decision to  have a blood transfusion has been considered carefully by your caregiver before blood is given. Blood is not given unless the benefits outweigh the risks. AFTER THE TRANSFUSION Right after receiving a blood transfusion, you will usually feel much better and more energetic. This is especially true if your red blood cells have gotten low (anemic). The transfusion raises the level of the red blood cells which carry oxygen, and this usually causes an energy increase. The nurse administering the transfusion will monitor you carefully for complications. HOME CARE INSTRUCTIONS  No special instructions are needed after a transfusion. You may find your energy is better. Speak with your caregiver about any limitations on activity for underlying diseases you may have. SEEK MEDICAL CARE IF:  Your condition is not improving after your transfusion. You develop redness or irritation at the intravenous (IV) site. SEEK IMMEDIATE MEDICAL CARE IF:  Any of the following symptoms occur over the next 12 hours: Shaking chills. You have a temperature by mouth above 102 F (38.9 C), not controlled by medicine. Chest, back, or muscle pain. People around you feel you are not acting correctly or are confused. Shortness of breath or difficulty breathing. Dizziness and fainting. You get a rash or develop hives. You have a decrease in urine output. Your urine turns a dark color or changes to pink, red, or brown. Any of the following symptoms occur over the next 10 days: You have a temperature by mouth above 102 F (38.9 C), not controlled by medicine. Shortness of breath. Weakness after normal activity. The white part of the eye turns yellow (jaundice). You have a decrease in the amount of urine or are urinating less often. Your urine turns a dark color or changes to pink, red, or brown. Document Released: 11/11/2000 Document Revised: 02/06/2012 Document Reviewed: 06/30/2008 [facility] Patient Information 2014  [place], [place].  _______________________________________________________________________

## 2023-03-15 NOTE — Patient Instructions (Signed)
SURGICAL WAITING ROOM VISITATION  Patients having surgery or a procedure may have no more than 2 support people in the waiting area - these visitors may rotate.    Children under the age of 30 must have an adult with them who is not the patient.  Due to an increase in RSV and influenza rates and associated hospitalizations, children ages 42 and under may not visit patients in [facility] hospitals.  If the patient needs to stay at the hospital during part of their recovery, the visitor guidelines for inpatient rooms apply. Pre-op nurse will coordinate an appropriate time for 1 support person to accompany patient in pre-op.  This support person may not rotate.    Please refer to the [facility] website for the visitor guidelines for Inpatients (after your surgery is over and you are in a regular room).       Your procedure is scheduled on:  03/29/23    Report to [facility] Main Entrance    Report to admitting at 1000 AM   Call this number if you have problems the morning of surgery [phone]   Do not eat food  or drink liquids :After Midnight.                            If you have questions, please contact your surgeon's office.      Oral Hygiene is also important to reduce your risk of infection.                                    Remember - BRUSH YOUR TEETH THE MORNING OF SURGERY WITH YOUR REGULAR TOOTHPASTE  DENTURES WILL BE REMOVED PRIOR TO SURGERY PLEASE DO NOT APPLY "Poly grip" OR ADHESIVES!!!   Do NOT smoke after Midnight   Take these medicines the morning of surgery with A SIP OF WATER:  amlodipine   DO NOT TAKE ANY ORAL DIABETIC MEDICATIONS DAY OF YOUR SURGERY  Bring CPAP mask and tubing day of surgery.                              You may not have any metal on your body including hair pins, jewelry, and body piercing             Do not wear make-up, lotions, powders, perfumes/cologne, or deodorant  Do not wear nail polish including gel and S&S,  artificial/acrylic nails, or any other type of covering on natural nails including finger and toenails. If you have artificial nails, gel coating, etc. that needs to be removed by a nail salon please have this removed prior to surgery or surgery may need to be canceled/ delayed if the surgeon/ anesthesia feels like they are unable to be safely monitored.   Do not shave  48 hours prior to surgery.               Men may shave face and neck.   Do not bring valuables to the hospital. [place] [facility] IS NOT             RESPONSIBLE   FOR VALUABLES.   Contacts, glasses, dentures or bridgework may not be worn into surgery.   Bring small overnight bag day of surgery.   DO NOT BRING YOUR HOME MEDICATIONS TO THE HOSPITAL. PHARMACY WILL DISPENSE MEDICATIONS  LISTED ON YOUR MEDICATION LIST TO YOU DURING [place]!    Patients discharged on the day of surgery will not be allowed to drive home.  Someone NEEDS to stay with you for the first 24 hours after anesthesia.   Special Instructions: Bring a copy of your healthcare power of attorney and living will documents the day of surgery if you haven't scanned them before.              Please read over the following fact sheets you were given: IF [place] [phone]   If you received a COVID test during your pre-op visit  it is requested that you wear a mask when out in public, stay away from anyone that may not be feeling well and notify your surgeon if you develop symptoms. If you test positive for Covid or have been in contact with anyone that has tested positive in the last 10 days please notify you surgeon.    [facility] - Preparing for Surgery Before surgery, you can play an important role.  Because skin is not sterile, your skin needs to be as free of germs as possible.  You can reduce the number of germs on your skin by washing with CHG (chlorahexidine gluconate) soap before  surgery.  CHG is an antiseptic cleaner which kills germs and bonds with the skin to continue killing germs even after washing. Please DO NOT use if you have an allergy to CHG or antibacterial soaps.  If your skin becomes reddened/irritated stop using the CHG and inform your nurse when you arrive at Short Stay. Do not shave (including legs and underarms) for at least 48 hours prior to the first CHG shower.  You may shave your face/neck. Please follow these instructions carefully:  1.  Shower with CHG Soap the night before surgery and the  morning of Surgery.  2.  If you choose to wash your hair, wash your hair first as usual with your  normal  shampoo.  3.  After you shampoo, rinse your hair and body thoroughly to remove the  shampoo.                           4.  Use CHG as you would any other liquid soap.  You can apply chg directly  to the skin and wash                       Gently with a scrungie or clean washcloth.  5.  Apply the CHG Soap to your body ONLY FROM THE NECK DOWN.   Do not use on face/ open                           Wound or open sores. Avoid contact with eyes, ears mouth and genitals (private parts).                       Wash face,  Genitals (private parts) with your normal soap.             6.  Wash thoroughly, paying special attention to the area where your surgery  will be performed.  7.  Thoroughly rinse your body with warm water from the neck down.  8.  DO NOT shower/wash with your normal soap after using and rinsing off  the CHG Soap.                9.  Pat yourself dry with a clean towel.            10.  Wear clean pajamas.            11.  Place clean sheets on your bed the night of your first shower and do not  sleep with pets. Day of Surgery : Do not apply any lotions/deodorants the morning of surgery.  Please wear clean clothes to the hospital/surgery center.  FAILURE TO FOLLOW THESE INSTRUCTIONS MAY RESULT IN THE CANCELLATION OF YOUR SURGERY PATIENT  SIGNATURE_________________________________  NURSE SIGNATURE__________________________________  ________________________________________________________________________

## 2023-03-15 NOTE — Progress Notes (Signed)
Anesthesia Review:  PCP: Cardiologist : Chest x-ray : EKG : Echo : Stress test: Cardiac Cath :  Activity level:  Sleep Study/ CPAP : Fasting Blood Sugar :      / Checks Blood Sugar -- times a day:   Blood Thinner/ Instructions /Last Dose: ASA / Instructions/ Last Dose :    DM- type Hgba1c-   Farxiga-

## 2023-03-16 ENCOUNTER — Other Ambulatory Visit: Payer: Self-pay

## 2023-03-16 ENCOUNTER — Encounter (HOSPITAL_COMMUNITY): Payer: Self-pay

## 2023-03-16 ENCOUNTER — Encounter (HOSPITAL_COMMUNITY)
Admission: RE | Admit: 2023-03-16 | Discharge: 2023-03-16 | Disposition: A | Discharge status: Home / Self Care | Payer: Medicare Other | Source: Ambulatory Visit | Attending: Urology | Admitting: Urology

## 2023-03-16 VITALS — BP 153/87 | HR 57 | Temp 98.2°F | Resp 16 | Ht 63.0 in | Wt 182.0 lb

## 2023-03-16 DIAGNOSIS — E119 Type 2 diabetes mellitus without complications: Secondary | ICD-10-CM

## 2023-03-16 DIAGNOSIS — Z01818 Encounter for other preprocedural examination: Secondary | ICD-10-CM

## 2023-03-16 DIAGNOSIS — I10 Essential (primary) hypertension: Secondary | ICD-10-CM

## 2023-03-16 HISTORY — DX: Sleep apnea, unspecified: G47.30

## 2023-03-16 LAB — BASIC METABOLIC PANEL
Anion gap: 12 (ref 5–15)
BUN: 37 mg/dL — ABNORMAL HIGH (ref 8–23)
CO2: 20 mmol/L — ABNORMAL LOW (ref 22–32)
Calcium: 9.3 mg/dL (ref 8.9–10.3)
Chloride: 107 mmol/L (ref 98–111)
Creatinine, Ser: 1.5 mg/dL — ABNORMAL HIGH (ref 0.44–1.00)
GFR, Estimated: 35 mL/min — ABNORMAL LOW (ref >60)
Glucose, Bld: 108 mg/dL — ABNORMAL HIGH (ref 70–99)
Potassium: 4.4 mmol/L (ref 3.5–5.1)
Sodium: 139 mmol/L (ref 135–145)

## 2023-03-16 LAB — HEMOGLOBIN A1C
Hgb A1c MFr Bld: 6.1 % — ABNORMAL HIGH (ref 4.8–5.6)
Mean Plasma Glucose: 128.37 mg/dL

## 2023-03-16 LAB — TYPE AND SCREEN
ABO/RH(D): B POS
Antibody Screen: NEGATIVE

## 2023-03-16 LAB — CBC
HCT: 40.9 % (ref 36.0–46.0)
Hemoglobin: 11.7 g/dL — ABNORMAL LOW (ref 12.0–15.0)
MCH: 22 pg — ABNORMAL LOW (ref 26.0–34.0)
MCHC: 28.6 g/dL — ABNORMAL LOW (ref 30.0–36.0)
MCV: 77 fL — ABNORMAL LOW (ref 80.0–100.0)
Platelets: 210 10*3/uL (ref 150–400)
RBC: 5.31 MIL/uL — ABNORMAL HIGH (ref 3.87–5.11)
RDW: 17.3 % — ABNORMAL HIGH (ref 11.5–15.5)
WBC: 5.8 10*3/uL (ref 4.0–10.5)
nRBC: 0 % (ref 0.0–0.2)

## 2023-03-16 LAB — GLUCOSE, CAPILLARY: Glucose-Capillary: 97 mg/dL (ref 70–99)

## 2023-03-29 ENCOUNTER — Inpatient Hospital Stay (HOSPITAL_COMMUNITY): Payer: Medicare Other | Admitting: Anesthesiology

## 2023-03-29 ENCOUNTER — Encounter (HOSPITAL_COMMUNITY): Payer: Self-pay | Admitting: Urology

## 2023-03-29 ENCOUNTER — Other Ambulatory Visit: Payer: Self-pay

## 2023-03-29 ENCOUNTER — Inpatient Hospital Stay (HOSPITAL_COMMUNITY)
Admission: RE | Admit: 2023-03-29 | Discharge: 2023-03-30 | DRG: 658 | Disposition: A | Discharge status: Home / Self Care | Payer: Medicare Other | Source: Ambulatory Visit | Attending: Urology | Admitting: Urology

## 2023-03-29 ENCOUNTER — Encounter (HOSPITAL_COMMUNITY)
Admission: RE | Disposition: A | Discharge status: Home / Self Care | Payer: Self-pay | Source: Ambulatory Visit | Attending: Urology

## 2023-03-29 ENCOUNTER — Inpatient Hospital Stay (HOSPITAL_COMMUNITY): Payer: Medicare Other | Admitting: Physician Assistant

## 2023-03-29 DIAGNOSIS — Z961 Presence of intraocular lens: Secondary | ICD-10-CM | POA: Diagnosis present

## 2023-03-29 DIAGNOSIS — I10 Essential (primary) hypertension: Secondary | ICD-10-CM

## 2023-03-29 DIAGNOSIS — Z833 Family history of diabetes mellitus: Secondary | ICD-10-CM | POA: Diagnosis not present

## 2023-03-29 DIAGNOSIS — Z96611 Presence of right artificial shoulder joint: Secondary | ICD-10-CM | POA: Diagnosis present

## 2023-03-29 DIAGNOSIS — E669 Obesity, unspecified: Secondary | ICD-10-CM | POA: Diagnosis present

## 2023-03-29 DIAGNOSIS — N281 Cyst of kidney, acquired: Secondary | ICD-10-CM | POA: Diagnosis present

## 2023-03-29 DIAGNOSIS — Z8249 Family history of ischemic heart disease and other diseases of the circulatory system: Secondary | ICD-10-CM

## 2023-03-29 DIAGNOSIS — I129 Hypertensive chronic kidney disease with stage 1 through stage 4 chronic kidney disease, or unspecified chronic kidney disease: Secondary | ICD-10-CM | POA: Diagnosis present

## 2023-03-29 DIAGNOSIS — Z87891 Personal history of nicotine dependence: Secondary | ICD-10-CM

## 2023-03-29 DIAGNOSIS — N2889 Other specified disorders of kidney and ureter: Secondary | ICD-10-CM

## 2023-03-29 DIAGNOSIS — E1122 Type 2 diabetes mellitus with diabetic chronic kidney disease: Secondary | ICD-10-CM | POA: Diagnosis present

## 2023-03-29 DIAGNOSIS — Z6832 Body mass index (BMI) 32.0-32.9, adult: Secondary | ICD-10-CM | POA: Diagnosis not present

## 2023-03-29 DIAGNOSIS — C641 Malignant neoplasm of right kidney, except renal pelvis: Secondary | ICD-10-CM | POA: Diagnosis present

## 2023-03-29 DIAGNOSIS — N1832 Chronic kidney disease, stage 3b: Secondary | ICD-10-CM | POA: Diagnosis present

## 2023-03-29 DIAGNOSIS — E119 Type 2 diabetes mellitus without complications: Secondary | ICD-10-CM

## 2023-03-29 DIAGNOSIS — Z7984 Long term (current) use of oral hypoglycemic drugs: Secondary | ICD-10-CM | POA: Diagnosis not present

## 2023-03-29 DIAGNOSIS — Q632 Ectopic kidney: Secondary | ICD-10-CM | POA: Diagnosis not present

## 2023-03-29 DIAGNOSIS — Z01818 Encounter for other preprocedural examination: Secondary | ICD-10-CM

## 2023-03-29 HISTORY — PX: ROBOT ASSISTED LAPAROSCOPIC NEPHRECTOMY: SHX5140

## 2023-03-29 LAB — GLUCOSE, CAPILLARY
Glucose-Capillary: 101 mg/dL — ABNORMAL HIGH (ref 70–99)
Glucose-Capillary: 130 mg/dL — ABNORMAL HIGH (ref 70–99)
Glucose-Capillary: 155 mg/dL — ABNORMAL HIGH (ref 70–99)
Glucose-Capillary: 121 mg/dL — ABNORMAL HIGH (ref 70–99)

## 2023-03-29 LAB — HEMOGLOBIN AND HEMATOCRIT, BLOOD
HCT: 37.4 % (ref 36.0–46.0)
Hemoglobin: 11.2 g/dL — ABNORMAL LOW (ref 12.0–15.0)

## 2023-03-29 SURGERY — NEPHRECTOMY, RADICAL, ROBOT-ASSISTED, LAPAROSCOPIC, ADULT
Anesthesia: General | Laterality: Right

## 2023-03-29 MED ORDER — CHLORHEXIDINE GLUCONATE CLOTH 2 % EX PADS
6.0000 | MEDICATED_PAD | Freq: Every day | CUTANEOUS | Status: DC
  Administered 2023-03-30: 6 via TOPICAL

## 2023-03-29 MED ORDER — HYOSCYAMINE SULFATE 0.125 MG SL SUBL: 0.1250 mg | SUBLINGUAL_TABLET | SUBLINGUAL | Status: DC | PRN

## 2023-03-29 MED ORDER — ROCURONIUM BROMIDE 10 MG/ML (PF) SYRINGE
PREFILLED_SYRINGE | INTRAVENOUS | Status: AC
  Filled 2023-03-29: qty 10

## 2023-03-29 MED ORDER — PROPOFOL 10 MG/ML IV BOLUS
INTRAVENOUS | Status: DC | PRN
  Administered 2023-03-29: 110 mg via INTRAVENOUS
  Administered 2023-03-29: 50 mg via INTRAVENOUS

## 2023-03-29 MED ORDER — LACTATED RINGERS IR SOLN
Status: DC | PRN
  Administered 2023-03-29: 1000 mL

## 2023-03-29 MED ORDER — ORAL CARE MOUTH RINSE: 15.0000 mL | Freq: Once | OROMUCOSAL | Status: DC

## 2023-03-29 MED ORDER — DOCUSATE SODIUM 100 MG PO CAPS: 100.0000 mg | ORAL_CAPSULE | Freq: Two times a day (BID) | ORAL | Status: DC

## 2023-03-29 MED ORDER — ORAL CARE MOUTH RINSE: 15.0000 mL | OROMUCOSAL | Status: DC | PRN

## 2023-03-29 MED ORDER — PHENYLEPHRINE HCL (PRESSORS) 10 MG/ML IV SOLN
INTRAVENOUS | Status: AC
  Filled 2023-03-29: qty 1

## 2023-03-29 MED ORDER — INSULIN ASPART 100 UNIT/ML IJ SOLN
0.0000 [IU] | Freq: Three times a day (TID) | INTRAMUSCULAR | Status: DC
  Administered 2023-03-29: 3 [IU] via SUBCUTANEOUS
  Administered 2023-03-30 (×2): 2 [IU] via SUBCUTANEOUS

## 2023-03-29 MED ORDER — DIPHENHYDRAMINE HCL 12.5 MG/5ML PO ELIX: 12.5000 mg | ORAL_SOLUTION | Freq: Four times a day (QID) | ORAL | Status: DC | PRN

## 2023-03-29 MED ORDER — BUPIVACAINE LIPOSOME 1.3 % IJ SUSP
INTRAMUSCULAR | Status: DC | PRN
  Administered 2023-03-29: 20 mL

## 2023-03-29 MED ORDER — LIDOCAINE HCL (PF) 2 % IJ SOLN
INTRAMUSCULAR | Status: AC
  Filled 2023-03-29: qty 10

## 2023-03-29 MED ORDER — SODIUM CHLORIDE (PF) 0.9 % IJ SOLN
INTRAMUSCULAR | Status: AC
  Filled 2023-03-29: qty 20

## 2023-03-29 MED ORDER — IRBESARTAN 150 MG PO TABS
150.0000 mg | ORAL_TABLET | Freq: Every day | ORAL | Status: DC
  Administered 2023-03-29 – 2023-03-30 (×2): 150 mg via ORAL
  Filled 2023-03-29 (×2): qty 1

## 2023-03-29 MED ORDER — ONDANSETRON HCL 4 MG/2ML IJ SOLN
4.0000 mg | Freq: Once | INTRAMUSCULAR | Status: AC | PRN
  Administered 2023-03-29: 4 mg via INTRAVENOUS

## 2023-03-29 MED ORDER — ATORVASTATIN CALCIUM 20 MG PO TABS
20.0000 mg | ORAL_TABLET | Freq: Every day | ORAL | Status: DC
  Administered 2023-03-29: 20 mg via ORAL
  Filled 2023-03-29: qty 1

## 2023-03-29 MED ORDER — ROCURONIUM BROMIDE 50 MG/5ML IV SOSY
PREFILLED_SYRINGE | INTRAVENOUS | Status: DC | PRN
  Administered 2023-03-29: 30 mg via INTRAVENOUS
  Administered 2023-03-29: 70 mg via INTRAVENOUS

## 2023-03-29 MED ORDER — ONDANSETRON HCL 4 MG/2ML IJ SOLN
INTRAMUSCULAR | Status: AC
  Filled 2023-03-29: qty 2

## 2023-03-29 MED ORDER — ONDANSETRON HCL 4 MG/2ML IJ SOLN: 4.0000 mg | INTRAMUSCULAR | Status: DC | PRN

## 2023-03-29 MED ORDER — LACTATED RINGERS IV SOLN: INTRAVENOUS | Status: DC

## 2023-03-29 MED ORDER — PHENYLEPHRINE HCL-NACL 20-0.9 MG/250ML-% IV SOLN
INTRAVENOUS | Status: DC | PRN
  Administered 2023-03-29: 25 ug/min via INTRAVENOUS

## 2023-03-29 MED ORDER — FENTANYL CITRATE PF 50 MCG/ML IJ SOSY
PREFILLED_SYRINGE | INTRAMUSCULAR | Status: AC
  Administered 2023-03-29: 50 ug via INTRAVENOUS
  Filled 2023-03-29: qty 1

## 2023-03-29 MED ORDER — DOCUSATE SODIUM 100 MG PO CAPS
100.0000 mg | ORAL_CAPSULE | Freq: Two times a day (BID) | ORAL | Status: DC
  Administered 2023-03-29 – 2023-03-30 (×2): 100 mg via ORAL
  Filled 2023-03-29 (×2): qty 1

## 2023-03-29 MED ORDER — FENTANYL CITRATE (PF) 250 MCG/5ML IJ SOLN
INTRAMUSCULAR | Status: DC | PRN
  Administered 2023-03-29: 25 ug via INTRAVENOUS
  Administered 2023-03-29 (×3): 50 ug via INTRAVENOUS
  Administered 2023-03-29: 25 ug via INTRAVENOUS

## 2023-03-29 MED ORDER — CEFAZOLIN SODIUM-DEXTROSE 2-4 GM/100ML-% IV SOLN
2.0000 g | INTRAVENOUS | Status: AC
  Administered 2023-03-29: 2 g via INTRAVENOUS
  Filled 2023-03-29: qty 100

## 2023-03-29 MED ORDER — BUPIVACAINE LIPOSOME 1.3 % IJ SUSP
INTRAMUSCULAR | Status: AC
  Filled 2023-03-29: qty 20

## 2023-03-29 MED ORDER — HYDROMORPHONE HCL 1 MG/ML IJ SOLN
0.5000 mg | INTRAMUSCULAR | Status: DC | PRN
  Administered 2023-03-29: 0.5 mg via INTRAVENOUS
  Filled 2023-03-29: qty 1

## 2023-03-29 MED ORDER — SODIUM CHLORIDE 0.45 % IV SOLN: INTRAVENOUS | Status: DC

## 2023-03-29 MED ORDER — FENTANYL CITRATE (PF) 100 MCG/2ML IJ SOLN
INTRAMUSCULAR | Status: AC
  Filled 2023-03-29: qty 2

## 2023-03-29 MED ORDER — AMISULPRIDE (ANTIEMETIC) 5 MG/2ML IV SOLN
10.0000 mg | Freq: Once | INTRAVENOUS | Status: AC | PRN
  Administered 2023-03-29: 10 mg via INTRAVENOUS

## 2023-03-29 MED ORDER — SUGAMMADEX SODIUM 200 MG/2ML IV SOLN
INTRAVENOUS | Status: DC | PRN
  Administered 2023-03-29: 180 mg via INTRAVENOUS

## 2023-03-29 MED ORDER — ACETAMINOPHEN 500 MG PO TABS
1000.0000 mg | ORAL_TABLET | Freq: Four times a day (QID) | ORAL | Status: AC
  Administered 2023-03-29 – 2023-03-30 (×4): 1000 mg via ORAL
  Filled 2023-03-29 (×4): qty 2

## 2023-03-29 MED ORDER — ONDANSETRON HCL 4 MG/2ML IJ SOLN
INTRAMUSCULAR | Status: DC | PRN
  Administered 2023-03-29: 4 mg via INTRAVENOUS

## 2023-03-29 MED ORDER — HYDROCODONE-ACETAMINOPHEN 5-325 MG PO TABS: 1.0000 | ORAL_TABLET | Freq: Four times a day (QID) | ORAL | 0 refills | Status: DC | PRN

## 2023-03-29 MED ORDER — DEXAMETHASONE SODIUM PHOSPHATE 10 MG/ML IJ SOLN
INTRAMUSCULAR | Status: DC | PRN
  Administered 2023-03-29: 10 mg via INTRAVENOUS

## 2023-03-29 MED ORDER — PHENYLEPHRINE 80 MCG/ML (10ML) SYRINGE FOR IV PUSH (FOR BLOOD PRESSURE SUPPORT)
PREFILLED_SYRINGE | INTRAVENOUS | Status: AC
  Filled 2023-03-29: qty 10

## 2023-03-29 MED ORDER — ACETAMINOPHEN 10 MG/ML IV SOLN: 1000.0000 mg | Freq: Once | INTRAVENOUS | Status: DC | PRN

## 2023-03-29 MED ORDER — DAPAGLIFLOZIN PROPANEDIOL 5 MG PO TABS
5.0000 mg | ORAL_TABLET | Freq: Every day | ORAL | Status: DC
  Administered 2023-03-30: 5 mg via ORAL
  Filled 2023-03-29: qty 1

## 2023-03-29 MED ORDER — AMISULPRIDE (ANTIEMETIC) 5 MG/2ML IV SOLN
INTRAVENOUS | Status: AC
  Filled 2023-03-29: qty 4

## 2023-03-29 MED ORDER — DEXAMETHASONE SODIUM PHOSPHATE 10 MG/ML IJ SOLN
INTRAMUSCULAR | Status: AC
  Filled 2023-03-29: qty 1

## 2023-03-29 MED ORDER — PROPOFOL 10 MG/ML IV BOLUS
INTRAVENOUS | Status: AC
  Filled 2023-03-29: qty 20

## 2023-03-29 MED ORDER — LACTATED RINGERS IV SOLN: INTRAVENOUS | Status: DC | PRN

## 2023-03-29 MED ORDER — EPHEDRINE 5 MG/ML INJ
INTRAVENOUS | Status: AC
  Filled 2023-03-29: qty 5

## 2023-03-29 MED ORDER — FENTANYL CITRATE PF 50 MCG/ML IJ SOSY
PREFILLED_SYRINGE | INTRAMUSCULAR | Status: AC
  Filled 2023-03-29: qty 1

## 2023-03-29 MED ORDER — STERILE WATER FOR IRRIGATION IR SOLN
Status: DC | PRN
  Administered 2023-03-29: 1000 mL

## 2023-03-29 MED ORDER — DIPHENHYDRAMINE HCL 50 MG/ML IJ SOLN: 12.5000 mg | Freq: Four times a day (QID) | INTRAMUSCULAR | Status: DC | PRN

## 2023-03-29 MED ORDER — FENTANYL CITRATE PF 50 MCG/ML IJ SOSY
25.0000 ug | PREFILLED_SYRINGE | INTRAMUSCULAR | Status: DC | PRN
  Administered 2023-03-29: 50 ug via INTRAVENOUS

## 2023-03-29 MED ORDER — LIDOCAINE HCL (PF) 2 % IJ SOLN
INTRAMUSCULAR | Status: DC | PRN
  Administered 2023-03-29: 1.5 mg/kg/h via INTRADERMAL

## 2023-03-29 MED ORDER — SODIUM CHLORIDE (PF) 0.9 % IJ SOLN
INTRAMUSCULAR | Status: DC | PRN
  Administered 2023-03-29: 20 mL

## 2023-03-29 MED ORDER — OXYCODONE HCL 5 MG PO TABS
5.0000 mg | ORAL_TABLET | ORAL | Status: DC | PRN
  Administered 2023-03-29: 5 mg via ORAL
  Filled 2023-03-29: qty 1

## 2023-03-29 MED ORDER — CHLORHEXIDINE GLUCONATE 0.12 % MT SOLN: 15.0000 mL | Freq: Once | OROMUCOSAL | Status: DC

## 2023-03-29 MED ORDER — LIDOCAINE 2% (20 MG/ML) 5 ML SYRINGE
INTRAMUSCULAR | Status: DC | PRN
  Administered 2023-03-29: 60 mg via INTRAVENOUS

## 2023-03-29 MED ORDER — CHLORHEXIDINE GLUCONATE CLOTH 2 % EX PADS: 6.0000 | MEDICATED_PAD | Freq: Every day | CUTANEOUS | Status: DC

## 2023-03-29 MED ORDER — MAGNESIUM CITRATE PO SOLN: 1.0000 | Freq: Once | ORAL | Status: DC

## 2023-03-29 MED ORDER — EPHEDRINE SULFATE-NACL 50-0.9 MG/10ML-% IV SOSY
PREFILLED_SYRINGE | INTRAVENOUS | Status: DC | PRN
  Administered 2023-03-29: 10 mg via INTRAVENOUS

## 2023-03-29 SURGICAL SUPPLY — 64 items
ADH SKN CLS APL DERMABOND .7 (GAUZE/BANDAGES/DRESSINGS) ×2
APL PRP STRL LF DISP 70% ISPRP (MISCELLANEOUS) ×1
BAG COUNTER SPONGE SURGICOUNT (BAG) ×1 IMPLANT
BAG LAPAROSCOPIC 12 15 PORT 16 (BASKET) ×1 IMPLANT
BAG RETRIEVAL 12/15 (BASKET) ×1
BAG SPNG CNTER NS LX DISP (BAG) ×1
CHLORAPREP W/TINT 26 (MISCELLANEOUS) ×1 IMPLANT
CLIP LIGATING HEM O LOK PURPLE (MISCELLANEOUS) ×1 IMPLANT
CLIP LIGATING HEMO LOK XL GOLD (MISCELLANEOUS) ×1 IMPLANT
CLIP LIGATING HEMO O LOK GREEN (MISCELLANEOUS) ×1 IMPLANT
COVER SURGICAL LIGHT HANDLE (MISCELLANEOUS) ×1 IMPLANT
COVER TIP SHEARS 8 DVNC (MISCELLANEOUS) ×1 IMPLANT
CUTTER ECHEON FLEX ENDO 45 340 (ENDOMECHANICALS) IMPLANT
DERMABOND ADVANCED .7 DNX12 (GAUZE/BANDAGES/DRESSINGS) ×2 IMPLANT
DRAIN CHANNEL 15F RND FF 3/16 (WOUND CARE) IMPLANT
DRAPE ARM DVNC X/XI (DISPOSABLE) ×4 IMPLANT
DRAPE COLUMN DVNC XI (DISPOSABLE) ×1 IMPLANT
DRAPE INCISE IOBAN 66X45 STRL (DRAPES) ×1 IMPLANT
DRAPE SHEET LG 3/4 BI-LAMINATE (DRAPES) ×1 IMPLANT
DRIVER NDL LRG 8 DVNC XI (INSTRUMENTS) ×2 IMPLANT
DRIVER NDLE LRG 8 DVNC XI (INSTRUMENTS) ×2 IMPLANT
ELECT PENCIL ROCKER SW 15FT (MISCELLANEOUS) ×1 IMPLANT
ELECT REM PT RETURN 15FT ADLT (MISCELLANEOUS) ×1 IMPLANT
EVACUATOR SILICONE 100CC (DRAIN) IMPLANT
FORCEPS BPLR FENES DVNC XI (FORCEP) ×1 IMPLANT
FORCEPS PROGRASP DVNC XI (FORCEP) ×1 IMPLANT
GLOVE BIO SURGEON STRL SZ 6.5 (GLOVE) ×1 IMPLANT
GLOVE SURG LX STRL 7.5 STRW (GLOVE) ×2 IMPLANT
GOWN SRG XL LVL 4 BRTHBL STRL (GOWNS) ×1 IMPLANT
GOWN STRL NON-REIN XL LVL4 (GOWNS) ×1
GOWN STRL REUS W/ TWL XL LVL3 (GOWN DISPOSABLE) ×2 IMPLANT
GOWN STRL REUS W/TWL XL LVL3 (GOWN DISPOSABLE) ×2
HOLDER FOLEY CATH W/STRAP (MISCELLANEOUS) ×1 IMPLANT
IRRIG SUCT STRYKERFLOW 2 WTIP (MISCELLANEOUS) ×1
IRRIGATION SUCT STRKRFLW 2 WTP (MISCELLANEOUS) ×1 IMPLANT
KIT BASIN OR (CUSTOM PROCEDURE TRAY) ×1 IMPLANT
KIT TURNOVER KIT A (KITS) IMPLANT
LOOP VESSEL MAXI BLUE (MISCELLANEOUS) IMPLANT
NDL INSUFFLATION 14GA 120MM (NEEDLE) ×1 IMPLANT
NEEDLE INSUFFLATION 14GA 120MM (NEEDLE) ×1 IMPLANT
PORT ACCESS TROCAR AIRSEAL 12 (TROCAR) ×1 IMPLANT
PROTECTOR NERVE ULNAR (MISCELLANEOUS) ×2 IMPLANT
RELOAD STAPLE 45 2.6 WHT THIN (STAPLE) IMPLANT
SCISSORS MNPLR CVD DVNC XI (INSTRUMENTS) ×1 IMPLANT
SEAL UNIV 5-12 XI (MISCELLANEOUS) ×3 IMPLANT
SET TRI-LUMEN FLTR TB AIRSEAL (TUBING) ×1 IMPLANT
SOL ELECTROSURG ANTI STICK (MISCELLANEOUS) ×1
SOLUTION ELECTROSURG ANTI STCK (MISCELLANEOUS) ×1 IMPLANT
SPIKE FLUID TRANSFER (MISCELLANEOUS) ×1 IMPLANT
SPONGE T-LAP 4X18 ~~LOC~~+RFID (SPONGE) IMPLANT
STAPLE RELOAD 45 WHT (STAPLE) ×2 IMPLANT
STAPLE RELOAD 45MM WHITE (STAPLE) ×2
SUT ETHILON 3 0 PS 1 (SUTURE) IMPLANT
SUT MNCRL AB 4-0 PS2 18 (SUTURE) ×2 IMPLANT
SUT PDS AB 1 CT1 27 (SUTURE) ×3 IMPLANT
SUT VIC AB 2-0 SH 27 (SUTURE) ×1
SUT VIC AB 2-0 SH 27X BRD (SUTURE) ×1 IMPLANT
SUT VICRYL 0 UR6 27IN ABS (SUTURE) IMPLANT
TOWEL OR 17X26 10 PK STRL BLUE (TOWEL DISPOSABLE) ×1 IMPLANT
TRAY FOLEY MTR SLVR 16FR STAT (SET/KITS/TRAYS/PACK) ×1 IMPLANT
TRAY LAPAROSCOPIC (CUSTOM PROCEDURE TRAY) ×1 IMPLANT
TROCAR Z THREAD OPTICAL 12X100 (TROCAR) ×1 IMPLANT
TROCAR Z-THREAD OPTICAL 5X100M (TROCAR) IMPLANT
WATER STERILE IRR 1000ML POUR (IV SOLUTION) ×1 IMPLANT

## 2023-03-29 NOTE — Op Note (Signed)
[name], Dana Fleming MEDICAL RECORD NO: [id number] ACCOUNT NO: [id number] DATE OF BIRTH: [birth year] FACILITY: [facility] LOCATION: [id number] PHYSICIAN: Sebastian Ache, MD  Operative Report   DATE OF PROCEDURE: 03/29/2023  PREOPERATIVE DIAGNOSIS:  Right renal mass.  PROCEDURES:  Robotic-assisted laparoscopic right radical nephrectomy.  ESTIMATED BLOOD LOSS:  50 mL  COMPLICATIONS:  None.  SPECIMEN:  Right radical nephrectomy for permanent pathology.  ASSISTANT:  Harrie Foreman, PA  DRAINS:  Foley catheter to straight drain.  FINDINGS: 1.  Single artery, single vein, right renovascular anatomy, early branching artery. 2.  Significant rotational abnormality of the kidney.  The upper pole very anterior.  INDICATIONS:  The patient is a very pleasant and quite vigorous 82 year old lady who was found on workup of mild renal insufficiency to have a fairly large right renal neoplasm.  Staging imaging was clinically localized.  This mass was greater than 5 cm  and abuts the hilar fat concerning for likely stage II to stage III disease.  Contralateral kidney is unremarkable other than some small cysts.  Options were discussed for management including surveillance protocols versus ablative therapy versus  surgical extirpation with and without nephron-sparing and we both agreed on right radical nephrectomy, understanding that there were significant competing oncologic and renal preservation risks.  She presents for this today.  Informed consent was  obtained and placed in medical record.  PROCEDURE IN DETAIL:  The patient being identified, verified. Procedure being right radical nephrectomy was confirmed.  Procedure timeout was performed.  Intravenous antibiotics were administered.  General endotracheal anesthesia induced.  The patient  was placed on the right side up full flank position pulling 15 degrees of table flexion, superior arm elevator, axillary roll, sequential compression devices, bottom leg bent,  top leg straight.  She was further fastened to operating table using 3-inch  tape over foam padding across the supraxiphoid chest and her pelvis.  A sterile field was created by prepping and draping the patient's entire right flank and abdomen using chlorhexidine gluconate and high flow, low pressure, pneumoperitoneum was  obtained using Veress technique in the right lower quadrant, having passed the aspiration and drop test.  Next, an 8 mm robotic camera port was placed in position approximately one handbreadth inferior to the costal margin.  Laparoscopic examination of  peritoneal cavity revealed no significant adhesions, no visceral injury.  Additional ports were placed as follows 5 mm subxiphoid port through which a self-locking laparoscopic grasper was used to elevate the inferior edge of the liver off of the  anterior surface of the Gerota's fascia, superior robotic port just beneath the right costal margin, right inferior robotic port, approximately 1 handbreadth superior to pubic ramus, right far lateral 8 mm robotic port, approximately 1 handbreadth  superior medial to the anterior iliac spine and two 12 mm assistant port sites to midline, one approximately 2 fingerbreadths superior to the plane of the camera port and another 3 fingerbreadths inferior.  The superior port being AirSeal type.  Robot  was docked and passed the electronic checks.  Attention was directed at development of retroperitoneum. Incision made lateral to the ascending colon from the area of the cecum towards the area of the hepatic flexure was carefully mobilized medially.   Notably, there was significant desmoplasia and some edema within the planes between the anterior Gerota's fascia and the posterior aspect of the colonic mesentery.  The duodenum already lie well medial to the lateral surface of the inferior vena cava and  did not require formal kocherization.  Kidney has significant rotational abnormality as anticipated.   There appeared to be the superior pole very medial.  Lower pole lateral essentially 90 degrees of rotation on the hilar access.  Several small cortical  cysts noted. The lower pole kidney area was identified, placed on gentle lateral traction.  Dissection proceeded medial to this.  Gonadal vessels were encountered and allowed to fall medially as were visualized coursing into the inferior vena cava. Psoas  musculature was identified.  Dissection proceeded within this triangle towards the renal hilum.  Renal hilum consists of a single artery and very early branching single vein, right renovascular anatomy.  The artery was controlled using extra-large  Hem-o-lok clip proximal to the area of bifurcation of the artery, vascular load stapler distal and the vein controlled using vascular stapler this resulted in excellent hemostatic control of renal hilum.  Dissection proceeded within the medial plane  medial to the area of the adrenal gland with medial adrenal attachments taken down using a combination of bipolar energy and vascular stapler and superior attachments was taken down with vascular stapler.  Lateral attachments taken down using cautery  scissors.  The ureter was doubly clipped and ligated, completely freed up the large right kidney with mass specimen and adrenal gland en bloc was placed in an extra-large EndoCatch bag for later retrieval.  Abdomen was once again inspected.  Hemostasis  was excellent.  Sponge and needle counts were correct.  I was quite happy with hemostasis of the hilum.  Liver retractor was taken down, robot was then undocked.  Specimen was retrieved by connecting the previous assistant port sites in the midline,  removing the right kidney specimen setting aside for permanent pathology.  Extraction site was closed with fascia using figure-of-eight PDS x7 followed by reapproximation of Scarpa's with running Vicryl.  All incision sites were infiltrated with dilute  lipolyzed Marcaine  and closed at the level of the skin using subcuticular Monocryl followed by Dermabond.  Procedure was then terminated.  The patient tolerated the procedure well, no immediate perioperative complications.  The patient was taken to  postanesthesia care unit in stable condition.  Plan for inpatient admission.  Please note, first assistant, Harrie Foreman, was crucial for all portions of the surgery today.  She provided invaluable retraction, suctioning, vascular clipping, vascular stapling, robotic instrument exchange and general first assistance.   PUS D: 03/29/2023 2:27:41 pm T: 03/29/2023 8:30:00 pm  JOB: [id number]/ [id number]

## 2023-03-29 NOTE — H&P (Signed)
Dana Fleming is an 82 y.o. female.    Chief Complaint: Pre-OP RIGHT Radical Neprectomy  HPI:   1 - Solid Right Renal Mass, Bilateral Renal Cysts - 6cm heterogenous, solid Rt upper mass on [place] then CT 2023 on eval renal insuficny. Appears 1 artery / 1 vein right renovasculat anatomy. Background of bilateral renal cysts of varying complexity.  Recent Course:  02/2023 - MRI - Rt upper 6cm enhancing mass, 1 artery / 1 vein, Other cysts minimally complex. Mass with significcnat encroachemnt to sinus fat (likely early T3).   2 - Stage 3b Renal Insuficiency - Cr 1.7's at baseline 2/2 medical renal disease. Long h/o DM and HTN. Follows Dr. [facility].   PMH sig for obesity, DM2. NO CV diseaes / blood thinners. NO chest/abd surgery. 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 Moved to [place] to retire, has niece Dana Fleming) nearby. Her PCP is Dana James MD.   Today " Dana Fleming " is seen to proceed with RIGHT robotic radical nephrectomy for renal neoplasm. NO interval fevers. Hgb 11.7, Cr 1.5 most recently.     Past Medical History:  Diagnosis Date   CKD (chronic kidney disease), stage III (HCC)    Diabetes mellitus without complication (HCC)    Type 2   Family history of adverse reaction to anesthesia    cousin didn't know he had sleep apnea and was on a ventilator for a month after   Hypertension    Sleep apnea    grandson has sleep apnea pt does not    Past Surgical History:  Procedure Laterality Date   CLOSED REDUCTION SHOULDER DISLOCATION     had conscious sedation   EYE SURGERY Bilateral    cataract surgery with lens implant   MULTIPLE TOOTH EXTRACTIONS     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   Procedure: REVERSE TOTAL SHOULDER ARTHROPLASTY;  Surgeon: Cammy Copa, MD;  Location: [facility] OR;  Service: Orthopedics;  Laterality: Right;  REVERSE TOTAL SHOULDER ARTHROPLASTY    Family History  Problem Relation Age of Onset   Stroke Mother    Diabetes Mother     Diabetes Father    Heart attack Father    Social History:  reports that she quit smoking about 32 years ago. Her smoking use included cigarettes. She has never used smokeless tobacco. She reports current alcohol use. She reports that she does not use drugs.  Allergies: No Known Allergies  No medications prior to admission.    No results found for this or any previous visit (from the past 48 hour(s)). No results found.  Review of Systems  Constitutional:  Negative for chills and fever.  All other systems reviewed and are negative.   There were no vitals taken for this visit. Physical Exam Vitals reviewed.  HENT:     Head: Normocephalic.     Mouth/Throat:     Mouth: Mucous membranes are moist.  Eyes:     Pupils: Pupils are equal, round, and reactive to light.  Cardiovascular:     Rate and Rhythm: Normal rate.  Abdominal:     General: Abdomen is flat.     Comments: Stable large truncal obesity limits sensitivity of exam.   Genitourinary:    Comments: No CVAT at present Musculoskeletal:        General: Normal range of motion.     Cervical back: Normal range of motion.  Skin:    General: Skin is warm.  Neurological:     General:  No focal deficit present.     Mental Status: She is alert.  Psychiatric:        Mood and Affect: Mood normal.      Assessment/Plan Proceed as planned with RIGHT robotic radical nephrectomy. Risks, beneftis, alternatives, expected peri-op course discussed previously and reiterated today.   Dana Fleming., MD 03/29/2023, 6:50 AM

## 2023-03-29 NOTE — Anesthesia Preprocedure Evaluation (Addendum)
Anesthesia Evaluation  Patient identified by MRN, date of birth, ID band Patient awake    Reviewed: Allergy & Precautions, NPO status , Patient's Chart, lab work & pertinent test results  Airway Mallampati: II  TM Distance: >3 FB Neck ROM: Full    Dental  (+) Missing, Partial Upper   Pulmonary former smoker   Pulmonary exam normal        Cardiovascular hypertension, Pt. on medications Normal cardiovascular exam     Neuro/Psych negative neurological ROS  negative psych ROS   GI/Hepatic negative GI ROS, Neg liver ROS,,,  Endo/Other  diabetes, Oral Hypoglycemic Agents    Renal/GU Renal disease     Musculoskeletal  (+) Arthritis ,    Abdominal   Peds  Hematology negative hematology ROS (+)   Anesthesia Other Findings RIGHT RENAL MASS  Reproductive/Obstetrics                             Anesthesia Physical Anesthesia Plan  ASA: 3  Anesthesia Plan: General   Post-op Pain Management:    Induction: Intravenous  PONV Risk Score and Plan: 4 or greater and Ondansetron, Dexamethasone, Propofol infusion and Treatment may vary due to age or medical condition  Airway Management Planned: Oral ETT  Additional Equipment:   Intra-op Plan:   Post-operative Plan: Extubation in OR  Informed Consent: I have reviewed the patients History and Physical, chart, labs and discussed the procedure including the risks, benefits and alternatives for the proposed anesthesia with the patient or authorized representative who has indicated his/her understanding and acceptance.     Dental advisory given  Plan Discussed with: CRNA  Anesthesia Plan Comments:        Anesthesia Quick Evaluation

## 2023-03-29 NOTE — Discharge Instructions (Signed)

## 2023-03-29 NOTE — Transfer of Care (Signed)
Immediate Anesthesia Transfer of Care Note  Patient: Dana Fleming  Procedure(s) Performed: XI ROBOTIC ASSISTED LAPAROSCOPIC NEPHRECTOMY (Right)  Patient Location: PACU  Anesthesia Type:General  Level of Consciousness: drowsy and patient cooperative  Airway & Oxygen Therapy: Patient Spontanous Breathing and Patient connected to face mask oxygen  Post-op Assessment: Report given to RN and Post -op Vital signs reviewed and stable  Post vital signs: Reviewed and stable  Last Vitals:  Vitals Value Taken Time  BP 174/71 03/29/23 1424  Temp    Pulse 64 03/29/23 1430  Resp 18 03/29/23 1431  SpO2 100 % 03/29/23 1430  Vitals shown include unvalidated device data.  Last Pain:  Vitals:   03/29/23 1012  TempSrc:   PainSc: 0-No pain         Complications: No notable events documented.

## 2023-03-29 NOTE — Brief Op Note (Signed)
03/29/2023  2:19 PM  PATIENT:  Dana Fleming  82 y.o. female  PRE-OPERATIVE DIAGNOSIS:  RIGHT RENAL MASS  POST-OPERATIVE DIAGNOSIS:  RIGHT RENAL MASS  PROCEDURE:  Procedure(s) with comments: XI ROBOTIC ASSISTED LAPAROSCOPIC NEPHRECTOMY (Right) - 3 HRS  SURGEON:  Surgeon(s) and Role:    * Jovaughn Wojtaszek, Delbert Phenix., MD - Primary  PHYSICIAN ASSISTANT:   ASSISTANTS: Harrie Foreman PA   ANESTHESIA:   local and general  EBL:  50 mL   BLOOD ADMINISTERED:none  DRAINS:  Foley to gravity    LOCAL MEDICATIONS USED:  MARCAINE     SPECIMEN:  Source of Specimen:  Rt kidney with tumor and adrenal  DISPOSITION OF SPECIMEN:  PATHOLOGY  COUNTS:  YES  TOURNIQUET:  * No tourniquets in log *  DICTATION: .Other Dictation: Dictation Number [id number]  PLAN OF CARE: Admit to inpatient   PATIENT DISPOSITION:  PACU - hemodynamically stable.   Delay start of Pharmacological VTE agent (>24hrs) due to surgical blood loss or risk of bleeding: yes

## 2023-03-29 NOTE — Anesthesia Procedure Notes (Signed)
Procedure Name: Intubation Date/Time: 03/29/2023 12:12 PM  Performed by: Adria Dill, CRNAPre-anesthesia Checklist: Patient identified, Emergency Drugs available, Suction available and Patient being monitored Patient Re-evaluated:Patient Re-evaluated prior to induction Oxygen Delivery Method: Circle system utilized Preoxygenation: Pre-oxygenation with 100% oxygen Induction Type: IV induction Ventilation: Mask ventilation without difficulty Laryngoscope Size: Miller and 3 Grade View: Grade I Tube type: Oral Tube size: 7.0 mm Number of attempts: 1 Airway Equipment and Method: Stylet and Oral airway Placement Confirmation: ETT inserted through vocal cords under direct vision, positive ETCO2 and breath sounds checked- equal and bilateral Secured at: 21 cm Tube secured with: Tape Dental Injury: Teeth and Oropharynx as per pre-operative assessment

## 2023-03-30 ENCOUNTER — Encounter (HOSPITAL_COMMUNITY): Payer: Self-pay | Admitting: Urology

## 2023-03-30 ENCOUNTER — Other Ambulatory Visit: Payer: Self-pay

## 2023-03-30 LAB — BASIC METABOLIC PANEL
Anion gap: 10 (ref 5–15)
BUN: 20 mg/dL (ref 8–23)
CO2: 21 mmol/L — ABNORMAL LOW (ref 22–32)
Calcium: 8.9 mg/dL (ref 8.9–10.3)
Chloride: 102 mmol/L (ref 98–111)
Creatinine, Ser: 1.85 mg/dL — ABNORMAL HIGH (ref 0.44–1.00)
GFR, Estimated: 27 mL/min — ABNORMAL LOW (ref >60)
Glucose, Bld: 124 mg/dL — ABNORMAL HIGH (ref 70–99)
Potassium: 4.4 mmol/L (ref 3.5–5.1)
Sodium: 133 mmol/L — ABNORMAL LOW (ref 135–145)

## 2023-03-30 LAB — GLUCOSE, CAPILLARY
Glucose-Capillary: 121 mg/dL — ABNORMAL HIGH (ref 70–99)
Glucose-Capillary: 120 mg/dL — ABNORMAL HIGH (ref 70–99)
Glucose-Capillary: 136 mg/dL — ABNORMAL HIGH (ref 70–99)

## 2023-03-30 LAB — HEMOGLOBIN AND HEMATOCRIT, BLOOD
HCT: 35.5 % — ABNORMAL LOW (ref 36.0–46.0)
Hemoglobin: 10.8 g/dL — ABNORMAL LOW (ref 12.0–15.0)

## 2023-03-30 NOTE — TOC Initial Note (Signed)
Transition of Care [facility]) - Initial/Assessment Note    Patient Details  Name: Dana Fleming MRN: [id number] Date of Birth: 05-29-1941  [facility] of [facility]) CM/SW Contact:    Lanier Clam, RN Phone Number: 03/30/2023, 10:22 AM  Clinical Narrative:  Referral for HHC-currently no indiication for [facility] services. Continue to monitor for d/c plans.                 Expected Discharge Plan: Home/Self Care Barriers to Discharge: Continued Medical Work up   Patient Goals and CMS Choice Patient states their goals for this hospitalization and ongoing recovery are:: Home CMS Medicare.gov Compare Post Acute Care list provided to:: Patient Choice offered to / list presented to : Patient [place] ownership interest in [facility].provided to:: Patient    Expected Discharge Plan and Services   Discharge Planning Services: CM Consult   Living arrangements for the past 2 months: Single Family Home                                      Prior Living Arrangements/Services Living arrangements for the past 2 months: Single Family Home Lives with:: Adult Children                   Activities of Daily Living Home Assistive Devices/Equipment: None ADL Screening (condition at time of admission) Patient's cognitive ability adequate to safely complete daily activities?: Yes Is the patient deaf or have difficulty hearing?: No Does the patient have difficulty seeing, even when wearing glasses/contacts?: No Does the patient have difficulty concentrating, remembering, or making decisions?: No Patient able to express need for assistance with ADLs?: Yes Does the patient have difficulty dressing or bathing?: No Independently performs ADLs?: Yes (appropriate for developmental age) Does the patient have difficulty walking or climbing stairs?: Yes Weakness of Legs: Both Weakness of Arms/Hands: None  Permission Sought/Granted                  Emotional Assessment               Admission diagnosis:  Renal mass [N28.89] Patient Active Problem List   Diagnosis Date Noted   Renal mass 03/29/2023   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 [place] [address] Phone: [phone] Fax: [phone]     Social Determinants of Health (SDOH) Social History: SDOH Screenings   Food Insecurity: No Food Insecurity (03/29/2023)  Housing: Low Risk  (03/29/2023)  Transportation Needs: No Transportation Needs (03/29/2023)  Utilities: Not At Risk (03/29/2023)  Tobacco Use: Medium Risk (03/29/2023)   SDOH Interventions:     Readmission Risk Interventions     No data to display

## 2023-03-30 NOTE — Anesthesia Postprocedure Evaluation (Signed)
Anesthesia Post Note  Patient: Dana Fleming  Procedure(s) Performed: XI ROBOTIC ASSISTED LAPAROSCOPIC NEPHRECTOMY (Right)     Patient location during evaluation: PACU Anesthesia Type: General Level of consciousness: awake Pain management: pain level controlled Vital Signs Assessment: post-procedure vital signs reviewed and stable Respiratory status: spontaneous breathing, nonlabored ventilation and respiratory function stable Cardiovascular status: blood pressure returned to baseline and stable Postop Assessment: no apparent nausea or vomiting Anesthetic complications: no   No notable events documented.  Last Vitals:  Vitals:   03/30/23 0100 03/30/23 0603  BP: (!) 147/73 (!) 170/62  Pulse: 66 (!) 59  Resp: 18 20  Temp: 37.2 C 37 C  SpO2: 100% 100%    Last Pain:  Vitals:   03/30/23 0603  TempSrc: Oral  PainSc: 0-No pain                 Milagros Middendorf P Cassidey Barrales

## 2023-03-30 NOTE — Plan of Care (Signed)
  Problem: Education: Goal: Knowledge of the prescribed therapeutic regimen will improve Outcome: Completed/Met   Problem: Bowel/Gastric: Goal: Gastrointestinal status for postoperative course will improve Outcome: Completed/Met   Problem: Clinical Measurements: Goal: Postoperative complications will be avoided or minimized Outcome: Completed/Met   Problem: Respiratory: Goal: Ability to achieve and maintain a regular respiratory rate will improve Outcome: Completed/Met   Problem: Skin Integrity: Goal: Demonstration of wound healing without infection will improve Outcome: Completed/Met   Problem: Urinary Elimination: Goal: Ability to avoid or minimize complications of infection will improve Outcome: Completed/Met Goal: Ability to achieve and maintain urine output will improve Outcome: Completed/Met   Problem: Education: Goal: Ability to describe self-care measures that may prevent or decrease complications (Diabetes Survival Skills Education) will improve Outcome: Completed/Met Goal: Individualized Educational Video(s) Outcome: Completed/Met   Problem: Coping: Goal: Ability to adjust to condition or change in health will improve Outcome: Completed/Met   Problem: Fluid Volume: Goal: Ability to maintain a balanced intake and output will improve Outcome: Completed/Met   Problem: Health Behavior/Discharge Planning: Goal: Ability to identify and utilize available resources and services will improve Outcome: Completed/Met Goal: Ability to manage health-related needs will improve Outcome: Completed/Met   Problem: Metabolic: Goal: Ability to maintain appropriate glucose levels will improve Outcome: Completed/Met   Problem: Skin Integrity: Goal: Risk for impaired skin integrity will decrease Outcome: Completed/Met   Problem: Nutritional: Goal: Maintenance of adequate nutrition will improve Outcome: Completed/Met Goal: Progress toward achieving an optimal weight will  improve Outcome: Completed/Met   Problem: Tissue Perfusion: Goal: Adequacy of tissue perfusion will improve Outcome: Completed/Met   Problem: Education: Goal: Knowledge of General Education information will improve Description: Including pain rating scale, medication(s)/side effects and non-pharmacologic comfort measures Outcome: Completed/Met   Problem: Health Behavior/Discharge Planning: Goal: Ability to manage health-related needs will improve Outcome: Completed/Met   Problem: Clinical Measurements: Goal: Ability to maintain clinical measurements within normal limits will improve Outcome: Completed/Met Goal: Will remain free from infection Outcome: Completed/Met Goal: Diagnostic test results will improve Outcome: Completed/Met Goal: Respiratory complications will improve Outcome: Completed/Met Goal: Cardiovascular complication will be avoided Outcome: Completed/Met   Problem: Activity: Goal: Risk for activity intolerance will decrease Outcome: Completed/Met   Problem: Nutrition: Goal: Adequate nutrition will be maintained Outcome: Completed/Met   Problem: Coping: Goal: Level of anxiety will decrease Outcome: Completed/Met   Problem: Elimination: Goal: Will not experience complications related to bowel motility Outcome: Completed/Met Goal: Will not experience complications related to urinary retention Outcome: Completed/Met   Problem: Pain Managment: Goal: General experience of comfort will improve Outcome: Completed/Met   Problem: Safety: Goal: Ability to remain free from injury will improve Outcome: Completed/Met   Problem: Skin Integrity: Goal: Risk for impaired skin integrity will decrease Outcome: Completed/Met

## 2023-03-30 NOTE — Discharge Summary (Signed)
[facility] Discharge Summary  Admit date: 03/29/2023  Discharge date and time: 03/30/23   Discharge to: Home  Discharge Service: Urology  Discharge Attending Physician:  Sebastian Ache, MD  Discharge  Diagnoses: Renal mass  Secondary Diagnosis: Principal Problem:   Renal mass   OR Procedures: Procedure(s): XI ROBOTIC ASSISTED LAPAROSCOPIC NEPHRECTOMY 03/29/2023   Ancillary Procedures: None   Discharge Day Services: The patient was seen and examined by the Urology team both in the morning and immediately prior to discharge.  Vital signs and laboratory values were stable and within normal limits.  The physical exam was benign and unchanged and all surgical wounds were examined.  Discharge instructions were explained and all questions answered.  Subjective  No acute events overnight. Pain Controlled. No fever or chills.  Objective No data found. No intake/output data recorded.  General Appearance:        No acute distress Lungs:                       Normal work of breathing on room air Heart:                                Regular rate and rhythm Abdomen:                         Soft, non-tender, non-distended GU:          Voiding spontaneously Extremities:                      Warm and well perfused   Hospital Course:  The patient underwent robotic right radical nephrectomy for a renal mass on 03/29/2023.  The patient tolerated the procedure well, was extubated in the OR, and afterwards was taken to the PACU for routine post-surgical care. When stable the patient was transferred to the floor.   The patient did well postoperatively.  The patient's diet was slowly advanced and at the time of discharge was tolerating a regular diet.  The patient was discharged home 1 Day Post-Op, at which point was tolerating a regular solid diet, was able to void spontaneously, have adequate pain control with P.O. pain medication, and could ambulate without difficulty. The patient will follow  up with [place] for post op check.   Condition at Discharge: Improved  Discharge Medications:  Allergies as of 03/30/2023   No Known Allergies      Medication List     TAKE these medications    amLODipine 5 MG tablet Commonly known as: NORVASC Take 5 mg by mouth daily.   atorvastatin 20 MG tablet Commonly known as: LIPITOR Take 20 mg by mouth at bedtime.   docusate sodium 100 MG capsule Commonly known as: COLACE Take 1 capsule (100 mg total) by mouth 2 (two) times daily.   ergocalciferol 1.25 MG (50000 UT) capsule Commonly known as: VITAMIN D2 Take 50,000 Units by mouth once a week. Sundays   Farxiga 5 MG Tabs tablet Generic drug: dapagliflozin propanediol Take 5 mg by mouth daily.   furosemide 20 MG tablet Commonly known as: LASIX Take 20 mg by mouth daily.   Ginger (Zingiber officinalis) 550 MG Caps Take 550 mg by mouth daily.   HYDROcodone-acetaminophen 5-325 MG tablet Commonly known as: Norco Take 1-2 tablets by mouth every 6 (six) hours as needed for moderate pain or severe pain.   olmesartan  20 MG tablet Commonly known as: BENICAR Take 20 mg by mouth at bedtime.   TART CHERRY PO Take 1,000 mg by mouth at bedtime.

## 2023-04-03 LAB — SURGICAL PATHOLOGY

## 2023-04-17 DIAGNOSIS — C641 Malignant neoplasm of right kidney, except renal pelvis: Secondary | ICD-10-CM | POA: Diagnosis not present

## 2023-05-16 DIAGNOSIS — M25561 Pain in right knee: Secondary | ICD-10-CM | POA: Diagnosis not present

## 2023-05-16 DIAGNOSIS — E559 Vitamin D deficiency, unspecified: Secondary | ICD-10-CM | POA: Diagnosis not present

## 2023-05-16 DIAGNOSIS — E1121 Type 2 diabetes mellitus with diabetic nephropathy: Secondary | ICD-10-CM | POA: Diagnosis not present

## 2023-05-18 ENCOUNTER — Other Ambulatory Visit (INDEPENDENT_AMBULATORY_CARE_PROVIDER_SITE_OTHER): Payer: Medicare Other

## 2023-05-18 ENCOUNTER — Ambulatory Visit (INDEPENDENT_AMBULATORY_CARE_PROVIDER_SITE_OTHER): Payer: Medicare Other | Admitting: Physician Assistant

## 2023-05-18 ENCOUNTER — Encounter: Payer: Self-pay | Admitting: Physician Assistant

## 2023-05-18 DIAGNOSIS — G8929 Other chronic pain: Secondary | ICD-10-CM

## 2023-05-18 DIAGNOSIS — M25561 Pain in right knee: Secondary | ICD-10-CM | POA: Diagnosis not present

## 2023-05-18 MED ORDER — METHYLPREDNISOLONE ACETATE 40 MG/ML IJ SUSP
80.0000 mg | INTRAMUSCULAR | Status: AC | PRN
Start: 2023-05-18 — End: 2023-05-18
  Administered 2023-05-18: 80 mg via INTRA_ARTICULAR

## 2023-05-18 MED ORDER — LIDOCAINE HCL 1 % IJ SOLN
2.0000 mL | INTRAMUSCULAR | Status: AC | PRN
Start: 2023-05-18 — End: 2023-05-18
  Administered 2023-05-18: 2 mL

## 2023-05-18 MED ORDER — BUPIVACAINE HCL 0.25 % IJ SOLN
2.0000 mL | INTRAMUSCULAR | Status: AC | PRN
Start: 2023-05-18 — End: 2023-05-18
  Administered 2023-05-18: 2 mL via INTRA_ARTICULAR

## 2023-05-18 NOTE — Progress Notes (Signed)
Office Visit Note   Patient: Dana Fleming           Date of Birth: 1940-12-01           MRN: [id number] Visit Date: 05/18/2023              Requested by: [name], MD [phone] [place],  [place] [address] PCP: [name], MD  Chief Complaint  Patient presents with   Right Knee - Pain      HPI: Dana Fleming is a pleasant 82 year old woman who is accompanied by her cousin today.  She presents today with a history of right knee pain.  She said this has been going on for about 2 months.  Denies any injury.  She did recently undergo a nephrectomy.  She cannot take anti-inflammatories.  She denies any fever or chills.  She does have a history significant for gout rates her pain is moderate denies any mechanical symptoms   Assessment & Plan: Visit Diagnoses:  1. Chronic pain of right knee     Plan: X-rays demonstrate advanced arthritic changes in her right knee.  I discussed this with her and her cousin who has had a knee replacement she has not really had any treatment so we talked about using Voltaren gel topically.  Also discussed a steroid injection today and physical therapy.  She would like to go forward with both of these should follow-up with me if she does not improve  Follow-Up Instructions: Return if symptoms worsen or fail to improve.   Ortho Exam  Patient is alert, oriented, no adenopathy, well-dressed, normal affect, normal respiratory effort. Right knee no effusion no erythema no warmth.  She has tenderness globally about her knee.  She does have crepitus with range of motion of the patellofemoral joint.  Compartments are soft she is neurovascularly intact  Imaging: XR KNEE 3 VIEW RIGHT  Result Date: 05/18/2023 Radiographs of her right knee were obtained today.  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  Labs: Lab Results  Component Value Date   HGBA1C 6.1 (H) 03/16/2023   HGBA1C 7.4 (H) 01/19/2016   REPTSTATUS 01/15/2016 FINAL 01/14/2016   CULT MULTIPLE SPECIES PRESENT, SUGGEST RECOLLECTION 01/14/2016     No results found for: "ALBUMIN", "PREALBUMIN", "CBC"  No results found for: "MG" No results found for: "VD25OH"  No results found for: "PREALBUMIN"    Latest Ref Rng & Units 03/30/2023    5:30 AM 03/29/2023    3:23 PM 03/16/2023    9:34 AM  CBC EXTENDED  WBC 4.0 - 10.5 K/uL   5.8   RBC 3.87 - 5.11 MIL/uL   5.31   Hemoglobin 12.0 - 15.0 g/dL 78.2  95.6  21.3   HCT 36.0 - 46.0 % 35.5  37.4  40.9   Platelets 150 - 400 K/uL   210      There is no height or weight on file to calculate BMI.  Orders:  Orders Placed This Encounter  Procedures   XR KNEE 3 VIEW RIGHT   Ambulatory referral to Physical Therapy   No orders of the defined types were placed in this encounter.    Procedures: Large Joint Inj: R knee on 05/18/2023 11:02 AM Indications: pain and diagnostic evaluation Details: 25 G 1.5 in needle, anteromedial approach  Arthrogram: No  Medications: 80 mg methylPREDNISolone acetate 40 MG/ML;  2 mL lidocaine 1 %; 2 mL bupivacaine 0.25 % Outcome: tolerated well, no immediate complications Procedure, treatment alternatives, risks and benefits explained, specific risks discussed. Consent was given by the patient.      Clinical Data: No additional findings.  ROS:  All other systems negative, except as noted in the HPI. Review of Systems  Objective: Vital Signs: There were no vitals taken for this visit.  Specialty Comments:  No specialty comments available.  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   Past Medical History:  Diagnosis Date   CKD (chronic kidney disease), stage III (HCC)    Diabetes mellitus without complication (HCC)    Type 2   Family history  of adverse reaction to anesthesia    cousin didn't know he had sleep apnea and was on a ventilator for a month after   Hypertension    Sleep apnea    grandson has sleep apnea pt does not    Family History  Problem Relation Age of Onset   Stroke Mother    Diabetes Mother    Diabetes Father    Heart attack Father     Past Surgical History:  Procedure Laterality Date   CLOSED REDUCTION SHOULDER DISLOCATION     had conscious sedation   EYE SURGERY Bilateral    cataract surgery with lens implant   MULTIPLE TOOTH EXTRACTIONS     ROBOT ASSISTED LAPAROSCOPIC NEPHRECTOMY Right 03/29/2023   Procedure: XI ROBOTIC ASSISTED LAPAROSCOPIC NEPHRECTOMY;  Surgeon: Loletta Parish., MD;  Location: [facility];  Service: Urology;  Laterality: Right;  3 HRS   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   Procedure: REVERSE TOTAL SHOULDER ARTHROPLASTY;  Surgeon: Cammy Copa, MD;  Location: [facility];  Service: Orthopedics;  Laterality: Right;  REVERSE TOTAL SHOULDER ARTHROPLASTY   Social History   Occupational History   Not on file  Tobacco Use   Smoking status: Former    Types: Cigarettes    Quit date: 01/13/1991    Years since quitting: 32.3   Smokeless tobacco: Never  Vaping Use   Vaping Use: Never used  Substance and Sexual Activity   Alcohol use: Yes    Comment: occasional   Drug use: No   Sexual activity: Not on file

## 2023-05-30 ENCOUNTER — Ambulatory Visit: Payer: Medicare Other | Admitting: Rehabilitative and Restorative Service Providers"

## 2023-05-30 ENCOUNTER — Encounter: Payer: Self-pay | Admitting: Rehabilitative and Restorative Service Providers"

## 2023-05-30 DIAGNOSIS — R262 Difficulty in walking, not elsewhere classified: Secondary | ICD-10-CM

## 2023-05-30 DIAGNOSIS — M25561 Pain in right knee: Secondary | ICD-10-CM | POA: Diagnosis not present

## 2023-05-30 DIAGNOSIS — R600 Localized edema: Secondary | ICD-10-CM

## 2023-05-30 DIAGNOSIS — G8929 Other chronic pain: Secondary | ICD-10-CM

## 2023-05-30 DIAGNOSIS — M6281 Muscle weakness (generalized): Secondary | ICD-10-CM | POA: Diagnosis not present

## 2023-05-30 DIAGNOSIS — M25661 Stiffness of right knee, not elsewhere classified: Secondary | ICD-10-CM | POA: Diagnosis not present

## 2023-05-30 NOTE — Therapy (Signed)
OUTPATIENT PHYSICAL THERAPY LOWER EXTREMITY EVALUATION   Patient Name: Dana Fleming MRN: [id number] DOB:November 09, 1941, 82 y.o., female Today's Date: 05/30/2023  END OF SESSION:  PT End of Session - 05/30/23 1654     Visit Number 1    Number of Visits 12    Date for PT Re-Evaluation 07/25/23    Authorization Type Medicare    Authorization - Visit Number 1    Progress Note Due on Visit 10    PT Start Time 1600    PT Stop Time 1643    PT Time Calculation (min) 43 min    Activity Tolerance Patient tolerated treatment well;No increased pain    Behavior During Therapy WFL for tasks assessed/performed             Past Medical History:  Diagnosis Date   CKD (chronic kidney disease), stage III (HCC)    Diabetes mellitus without complication (HCC)    Type 2   Family history of adverse reaction to anesthesia    cousin didn't know he had sleep apnea and was on a ventilator for a month after   Hypertension    Sleep apnea    grandson has sleep apnea pt does not   Past Surgical History:  Procedure Laterality Date   CLOSED REDUCTION SHOULDER DISLOCATION     had conscious sedation   EYE SURGERY Bilateral    cataract surgery with lens implant   MULTIPLE TOOTH EXTRACTIONS     ROBOT ASSISTED LAPAROSCOPIC NEPHRECTOMY Right 03/29/2023   Procedure: XI ROBOTIC ASSISTED LAPAROSCOPIC NEPHRECTOMY;  Surgeon: Loletta Parish., MD;  Location: [facility];  Service: Urology;  Laterality: Right;  3 HRS   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   Procedure: REVERSE TOTAL SHOULDER ARTHROPLASTY;  Surgeon: Cammy Copa, MD;  Location: [facility];  Service: Orthopedics;  Laterality: Right;  REVERSE TOTAL SHOULDER ARTHROPLASTY   Patient Active Problem List   Diagnosis Date Noted   Renal mass 03/29/2023   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  She received a cortisone shot last week with good pain relief noted.  She would like to get stronger and have less knee pain.  PERTINENT HISTORY: CKD, Type 2 diabetes, family history of bad reaction to anesthesia, HTN, Right TSA 2017 PAIN:  Are you having pain? Yes: NPRS scale: 3-6/10 over the last 2 weeks on a 10/10 Pain location: Medial Pain description: Sharp, achy Aggravating factors: Prolonged weight-bearing, prolonged sitting Relieving factors: Cortisone shot, can find a comfortable position  PRECAUTIONS: None  WEIGHT BEARING RESTRICTIONS: No  FALLS:  Has patient fallen in last 6 months? Yes. Number of falls 1, tripped over something on her deck  LIVING ENVIRONMENT: Lives with: lives with their family and niece Lives in: House/apartment Stairs:  Needs a handrail Has following equipment at home: Single point cane and Tour manager  OCCUPATION: Retired  PLOF: Independent  PATIENT GOALS: Read, watch mysteries, go to the senior center  NEXT MD VISIT: None scheduled  OBJECTIVE:   DIAGNOSTIC FINDINGS: Radiographs of  her right knee were obtained today.  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 with  bone-on-bone changes sclerotic changes and periarticular osteophytes  PATIENT SURVEYS:  FOTO 47 (Goal 64 in 12 visits)  COGNITION: Overall cognitive status: Within functional limits for tasks assessed     SENSATION: Notes some "tingling" distal from the knees to the toes  EDEMA:  Noted and not objectively assessed   LOWER EXTREMITY ROM:  Active ROM Left/Right 05/30/2023    Hip flexion    Hip extension    Hip abduction    Hip adduction    Hip internal rotation    Hip external rotation    Knee flexion 130/115   Knee extension 0/-8   Ankle dorsiflexion    Ankle plantarflexion    Ankle inversion    Ankle eversion     (Blank rows = not tested)  LOWER EXTREMITY STRENGTH:  In pounds assessed by hand-held dynamometer Left/Right 05/30/2023   Hip flexion    Hip extension    Hip abduction    Hip adduction    Hip internal rotation    Hip external rotation    Knee flexion    Knee extension 59.4/16.8   Ankle dorsiflexion    Ankle plantarflexion    Ankle inversion    Ankle eversion     (Blank rows = not tested)  GAIT: Distance walked: In the office Assistive device utilized: None Level of assistance: Complete Independence Comments: Decreased stance time and lateral lean noted on the right    TODAY'S TREATMENT:                                                                                                                              DATE: 05/30/2023 Quadriceps sets 10 x 5 seconds Seated tailgate knee flexion AROM 3 minutes    PATIENT EDUCATION:  Education details: Reviewed exam findings and home exercise program Person educated: Patient and niece Education method: Explanation, Demonstration, Tactile cues, Verbal cues, and Handouts Education comprehension: verbalized understanding, returned demonstration, verbal cues required, tactile cues required, and needs further education  HOME EXERCISE PROGRAM: [id number]  ASSESSMENT:  CLINICAL IMPRESSION: Patient is a 82  y.o. female who was seen today for physical therapy evaluation and treatment for M25.561,G89.29 (ICD-10-CM) - Chronic pain of right knee.  Dana Fleming has a history of chronic right knee pain with symptoms worsening recently.  She received a cortisone injection last week that gave her significant pain relief.  The most significant objective finding is significant quadriceps weakness assessed with  hand-held dynamometer.  Dana Fleming also has some expected right knee stiffness.  AROM, quadricep strength and edema control will be a focus of her early physical therapy.  Her prognosis to get stronger, move better and have pain relief is good with supervised physical therapy.  OBJECTIVE IMPAIRMENTS: Abnormal gait, decreased activity tolerance, decreased endurance, decreased knowledge of condition, difficulty walking, decreased ROM, decreased strength, increased edema, impaired perceived  functional ability, and pain.   ACTIVITY LIMITATIONS: sitting, standing, squatting, sleeping, stairs, and locomotion level  PARTICIPATION LIMITATIONS: driving and community activity  PERSONAL FACTORS: CKD, Type 2 diabetes, family history of bad reaction to anesthesia, HTN, Right TSA 2017 are also affecting patient's functional outcome.   REHAB POTENTIAL: Good  CLINICAL DECISION MAKING: Stable/uncomplicated  EVALUATION COMPLEXITY: Low   GOALS: Goals reviewed with patient? Yes  SHORT TERM GOALS: Target date: 06/26/3033 Rida will be independent with her day 1 HEP Baseline: Started 05/30/2023 Goal status: INITIAL  2.  Improve right knee AROM to 5 - 0 - 120 degrees Baseline: 8 - 0 - 115 degrees Goal status: INITIAL  3.  Improve right quadriceps strength to at least 25 pounds Baseline: 16.8 pounds Goal status: INITIAL  LONG TERM GOALS: Target date: 07/25/2023  Improve FOTO to 64 in 12 visits Baseline: 47 Goal status: INITIAL  2.  Dyllan will report right knee pain consistently 0-4/10 on the Numeric Pain Rating Scale Baseline: 3-6/10 Goal status: INITIAL  3.  Improve knee AROM to 3 - 0 - 125 degrees Baseline: 8 - 0 - 115 degrees Goal status: INITIAL  4.  Improve quadriceps strength to 35 pounds or better for preparation to transfer into independent rehabilitation Baseline: 16.8 pounds Goal status: INITIAL  5.  Letitia will be independent with her long-term HEP at [place] Baseline: Started 05/30/2023 Goal  status: INITIAL   PLAN:  PT FREQUENCY: 2x/week  PT DURATION: 8 weeks  PLANNED INTERVENTIONS: Therapeutic exercises, Therapeutic activity, Neuromuscular re-education, Balance training, Gait training, Patient/Family education, Self Care, Stair training, Cryotherapy, Vasopneumatic device, and Manual therapy  PLAN FOR NEXT SESSION: Review day 1 HEP.  Quadriceps strength emphasis to either avoid surgery or prepare for TKA.   Cherlyn Cushing, PT, MPT 05/30/2023, 5:10 PM

## 2023-06-02 ENCOUNTER — Encounter: Payer: Self-pay | Admitting: Rehabilitative and Restorative Service Providers"

## 2023-06-02 ENCOUNTER — Ambulatory Visit (INDEPENDENT_AMBULATORY_CARE_PROVIDER_SITE_OTHER): Payer: Medicare Other | Admitting: Rehabilitative and Restorative Service Providers"

## 2023-06-02 DIAGNOSIS — R600 Localized edema: Secondary | ICD-10-CM

## 2023-06-02 DIAGNOSIS — M25561 Pain in right knee: Secondary | ICD-10-CM | POA: Diagnosis not present

## 2023-06-02 DIAGNOSIS — M6281 Muscle weakness (generalized): Secondary | ICD-10-CM

## 2023-06-02 DIAGNOSIS — R262 Difficulty in walking, not elsewhere classified: Secondary | ICD-10-CM | POA: Diagnosis not present

## 2023-06-02 DIAGNOSIS — M25661 Stiffness of right knee, not elsewhere classified: Secondary | ICD-10-CM | POA: Diagnosis not present

## 2023-06-02 DIAGNOSIS — G8929 Other chronic pain: Secondary | ICD-10-CM

## 2023-06-02 NOTE — Therapy (Signed)
OUTPATIENT PHYSICAL THERAPY LOWER EXTREMITY TREATMENT   Patient Name: Dana Fleming MRN: [id number] DOB:25-Mar-1941, 82 y.o., female Today's Date: 06/02/2023  END OF SESSION:  PT End of Session - 06/02/23 1547     Visit Number 2    Number of Visits 12    Date for PT Re-Evaluation 07/25/23    Authorization Type Medicare    Authorization - Visit Number 2    Progress Note Due on Visit 10    PT Start Time 1515    PT Stop Time 1604    PT Time Calculation (min) 49 min    Activity Tolerance Patient tolerated treatment well;No increased pain    Behavior During Therapy WFL for tasks assessed/performed              Past Medical History:  Diagnosis Date   CKD (chronic kidney disease), stage III (HCC)    Diabetes mellitus without complication (HCC)    Type 2   Family history of adverse reaction to anesthesia    cousin didn't know he had sleep apnea and was on a ventilator for a month after   Hypertension    Sleep apnea    grandson has sleep apnea pt does not   Past Surgical History:  Procedure Laterality Date   CLOSED REDUCTION SHOULDER DISLOCATION     had conscious sedation   EYE SURGERY Bilateral    cataract surgery with lens implant   MULTIPLE TOOTH EXTRACTIONS     ROBOT ASSISTED LAPAROSCOPIC NEPHRECTOMY Right 03/29/2023   Procedure: XI ROBOTIC ASSISTED LAPAROSCOPIC NEPHRECTOMY;  Surgeon: Loletta Parish., MD;  Location: [facility];  Service: Urology;  Laterality: Right;  3 HRS   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   Procedure: REVERSE TOTAL SHOULDER ARTHROPLASTY;  Surgeon: Cammy Copa, MD;  Location: [facility];  Service: Orthopedics;  Laterality: Right;  REVERSE TOTAL SHOULDER ARTHROPLASTY   Patient Active Problem List   Diagnosis Date Noted   Renal mass 03/29/2023   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  She would still like to get stronger and have less knee pain.  Early HEP compliance has been very good.  PERTINENT HISTORY: CKD, Type 2 diabetes, family history of bad reaction to anesthesia, HTN, Right TSA 2017 PAIN:  Are you having pain? Yes: NPRS scale: 3-6/10 over the last 2 weeks on a 10/10 Pain location: Medial Pain description: Sharp, achy Aggravating factors: Prolonged weight-bearing, prolonged sitting Relieving factors: Cortisone shot, can find a comfortable position  PRECAUTIONS: None  WEIGHT BEARING RESTRICTIONS: No  FALLS:  Has patient fallen in last 6 months? Yes. Number of falls 1, tripped over something on her deck  LIVING ENVIRONMENT: Lives with: lives with their family and niece Lives in: House/apartment Stairs:  Needs a handrail Has following equipment at home: Single point cane and Tour manager  OCCUPATION: Retired  PLOF: Independent  PATIENT GOALS: Read, watch mysteries, go to the senior center  NEXT MD VISIT: None scheduled  OBJECTIVE:   DIAGNOSTIC FINDINGS: Radiographs of  her right knee were obtained today.  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 with  bone-on-bone changes sclerotic changes and periarticular osteophytes  PATIENT SURVEYS:  FOTO 47 (Goal 64 in 12 visits)  COGNITION: Overall cognitive status: Within functional limits for tasks assessed     SENSATION: Notes some "tingling" distal from the knees to the toes  EDEMA:  Noted and not objectively assessed   LOWER EXTREMITY ROM:  Active ROM Left/Right 05/30/2023   Hip  flexion    Hip extension    Hip abduction    Hip adduction    Hip internal rotation    Hip external rotation    Knee flexion 130/115   Knee extension 0/-8   Ankle dorsiflexion    Ankle plantarflexion    Ankle inversion    Ankle eversion     (Blank rows = not tested)  LOWER EXTREMITY STRENGTH:  In pounds assessed by hand-held dynamometer Left/Right 05/30/2023   Hip flexion    Hip extension    Hip abduction    Hip adduction    Hip internal rotation    Hip external rotation    Knee flexion    Knee extension 59.4/16.8   Ankle dorsiflexion    Ankle plantarflexion    Ankle inversion    Ankle eversion     (Blank rows = not tested)  GAIT: Distance walked: In the office Assistive device utilized: None Level of assistance: Complete Independence Comments: Decreased stance time and lateral lean noted on the right    TODAY'S TREATMENT:                                                                                                                              DATE:  06/02/2023 Quadriceps sets 2 sets of 10 x 5 seconds Seated tailgate knee flexion AROM 1 minute  Recumbent bike Seat 4 for 5 minutes Level 2 Seated straight leg raises 3 sets of 5 for 3 seconds  Functional Activities: Double Leg Press full extension and slow eccentrics 75# 2 sets of 10 Single Leg Press full extension and slow eccentrics 25# 10 x  Vaso right knee Medium 34* 10 minutes   05/30/2023 Quadriceps sets 10 x 5 seconds Seated tailgate knee flexion AROM 3 minutes    PATIENT EDUCATION:  Education details: Reviewed exam findings and home exercise program Person educated: Patient and niece Education method: Explanation, Demonstration, Tactile cues, Verbal cues, and Handouts Education comprehension: verbalized understanding, returned demonstration, verbal cues required, tactile cues required, and needs further education  HOME EXERCISE PROGRAM: [id number]  ASSESSMENT:  CLINICAL IMPRESSION: [name] is doing a  great job with her early HEP compliance and recall of exercises from day 1.  Quadriceps strengthening is the primary focus along with appropriate AROM work.  Continue to keep her HEP simple with appropriate clinic interventions to meet long-term goals.  OBJECTIVE IMPAIRMENTS: Abnormal gait, decreased activity tolerance, decreased endurance, decreased knowledge of condition,  difficulty walking, decreased ROM, decreased strength, increased edema, impaired perceived functional ability, and pain.   ACTIVITY LIMITATIONS: sitting, standing, squatting, sleeping, stairs, and locomotion level  PARTICIPATION LIMITATIONS: driving and community activity  PERSONAL FACTORS: CKD, Type 2 diabetes, family history of bad reaction to anesthesia, HTN, Right TSA 2017 are also affecting patient's functional outcome.   REHAB POTENTIAL: Good  CLINICAL DECISION MAKING: Stable/uncomplicated  EVALUATION COMPLEXITY: Low   GOALS: Goals reviewed with patient? Yes  SHORT TERM GOALS: Target date: 06/26/3033 Amarrah will be independent with her day 1 HEP Baseline: Started 05/30/2023 Goal status: Met 06/02/2023  2.  Improve right knee AROM to 5 - 0 - 120 degrees Baseline: 8 - 0 - 115 degrees Goal status: INITIAL  3.  Improve right quadriceps strength to at least 25 pounds Baseline: 16.8 pounds Goal status: INITIAL  LONG TERM GOALS: Target date: 07/25/2023  Improve FOTO to 64 in 12 visits Baseline: 47 Goal status: INITIAL  2.  Shali will report right knee pain consistently 0-4/10 on the Numeric Pain Rating Scale Baseline: 3-6/10 Goal status: On Going 06/02/2023  3.  Improve knee AROM to 3 - 0 - 125 degrees Baseline: 8 - 0 - 115 degrees Goal status: INITIAL  4.  Improve quadriceps strength to 35 pounds or better for preparation to transfer into independent rehabilitation Baseline: 16.8 pounds Goal status: INITIAL  5.  Sabirin will be independent with her long-term HEP at DC Baseline: Started 05/30/2023 Goal status:  INITIAL   PLAN:  PT FREQUENCY: 2x/week  PT DURATION: 8 weeks  PLANNED INTERVENTIONS: Therapeutic exercises, Therapeutic activity, Neuromuscular re-education, Balance training, Gait training, Patient/Family education, Self Care, Stair training, Cryotherapy, Vasopneumatic device, and Manual therapy  PLAN FOR NEXT SESSION: Review HEP.  Quadriceps strength emphasis to either avoid surgery or prepare for TKA.   Cherlyn Cushing, PT, MPT 06/02/2023, 3:58 PM

## 2023-06-05 ENCOUNTER — Encounter: Payer: Self-pay | Admitting: Physical Therapy

## 2023-06-05 ENCOUNTER — Ambulatory Visit (INDEPENDENT_AMBULATORY_CARE_PROVIDER_SITE_OTHER): Payer: Medicare Other | Admitting: Physical Therapy

## 2023-06-05 DIAGNOSIS — M25561 Pain in right knee: Secondary | ICD-10-CM | POA: Diagnosis not present

## 2023-06-05 DIAGNOSIS — G8929 Other chronic pain: Secondary | ICD-10-CM | POA: Diagnosis not present

## 2023-06-05 DIAGNOSIS — M25661 Stiffness of right knee, not elsewhere classified: Secondary | ICD-10-CM | POA: Diagnosis not present

## 2023-06-05 DIAGNOSIS — M6281 Muscle weakness (generalized): Secondary | ICD-10-CM | POA: Diagnosis not present

## 2023-06-05 DIAGNOSIS — R600 Localized edema: Secondary | ICD-10-CM

## 2023-06-05 DIAGNOSIS — R262 Difficulty in walking, not elsewhere classified: Secondary | ICD-10-CM

## 2023-06-05 NOTE — Therapy (Signed)
OUTPATIENT PHYSICAL THERAPY LOWER EXTREMITY TREATMENT   Patient Name: Dana Fleming MRN: [id number] DOB:December 15, 1940, 82 y.o., female Today's Date: 06/05/2023  END OF SESSION:  PT End of Session - 06/05/23 1516     Visit Number 3    Number of Visits 12    Date for PT Re-Evaluation 07/25/23    Authorization Type Medicare    Progress Note Due on Visit 10    PT Start Time 1517    PT Stop Time 1556    PT Time Calculation (min) 39 min    Activity Tolerance Patient tolerated treatment well;No increased pain    Behavior During Therapy WFL for tasks assessed/performed               Past Medical History:  Diagnosis Date   CKD (chronic kidney disease), stage III (HCC)    Diabetes mellitus without complication (HCC)    Type 2   Family history of adverse reaction to anesthesia    cousin didn't know he had sleep apnea and was on a ventilator for a month after   Hypertension    Sleep apnea    grandson has sleep apnea pt does not   Past Surgical History:  Procedure Laterality Date   CLOSED REDUCTION SHOULDER DISLOCATION     had conscious sedation   EYE SURGERY Bilateral    cataract surgery with lens implant   MULTIPLE TOOTH EXTRACTIONS     ROBOT ASSISTED LAPAROSCOPIC NEPHRECTOMY Right 03/29/2023   Procedure: XI ROBOTIC ASSISTED LAPAROSCOPIC NEPHRECTOMY;  Surgeon: Loletta Parish., MD;  Location: [facility];  Service: Urology;  Laterality: Right;  3 HRS   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   Procedure: REVERSE TOTAL SHOULDER ARTHROPLASTY;  Surgeon: Cammy Copa, MD;  Location: [facility];  Service: Orthopedics;  Laterality: Right;  REVERSE TOTAL SHOULDER ARTHROPLASTY   Patient Active Problem List   Diagnosis Date Noted   Renal mass 03/29/2023   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 No pain at present. HEP going well at home, no issues. States she has noticed some L foot cramps in the morning the past couple days but otherwise no new issues    PERTINENT HISTORY: CKD, Type 2 diabetes, family history of bad reaction to anesthesia, HTN, Right TSA 2017 PAIN:  Are you having pain? Yes: NPRS scale: 3-6/10 over the last 2 weeks on a 10/10 Pain location: Medial Pain description: Sharp, achy Aggravating factors: Prolonged weight-bearing, prolonged sitting Relieving factors: Cortisone shot, can find a comfortable position  PRECAUTIONS: None  WEIGHT BEARING RESTRICTIONS: No  FALLS:  Has patient fallen in last 6 months? Yes. Number of falls 1, tripped over something on her deck  LIVING ENVIRONMENT: Lives with: lives with their family and niece Lives in: House/apartment Stairs:  Needs a handrail Has following equipment at home: Single point cane and Tour manager  OCCUPATION: Retired  PLOF: Independent  PATIENT GOALS: Read, watch mysteries, go to the senior center  NEXT MD VISIT: None scheduled  OBJECTIVE: (objective measures completed  at initial evaluation unless otherwise dated)   DIAGNOSTIC FINDINGS: Radiographs of her right knee were obtained today.  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 with  bone-on-bone changes sclerotic changes and periarticular osteophytes  PATIENT SURVEYS:  FOTO 47 (Goal 64 in 12 visits)  COGNITION: Overall cognitive status: Within functional limits for tasks assessed     SENSATION: Notes some "tingling" distal from the knees to the toes  EDEMA:  Noted and  not objectively assessed   LOWER EXTREMITY ROM:  Active ROM Left/Right 05/30/2023   Hip flexion    Hip extension    Hip abduction    Hip adduction    Hip internal rotation    Hip external rotation    Knee flexion 130/115   Knee extension 0/-8   Ankle dorsiflexion    Ankle plantarflexion    Ankle inversion    Ankle eversion     (Blank rows = not tested)  LOWER EXTREMITY STRENGTH:  In pounds assessed by hand-held dynamometer Left/Right 05/30/2023   Hip flexion    Hip extension    Hip abduction    Hip adduction    Hip internal rotation    Hip external rotation    Knee flexion    Knee extension 59.4/16.8   Ankle dorsiflexion    Ankle plantarflexion    Ankle inversion    Ankle eversion     (Blank rows = not tested)  GAIT: Distance walked: In the office Assistive device utilized: None Level of assistance: Complete Independence Comments: Decreased stance time and lateral lean noted on the right    TODAY'S TREATMENT:                                                                                                                              [facility] Adult PT Treatment:                                                DATE: 06/05/23 Therapeutic Exercise: Supine quad set x15 w 5 sec hold BIL LE Supine quad set with heel prop x8 cues for pacing Mini SLR 3x5 cues for foot positioning and pacing  Standing knee flexion RLE 2x12 cues for posture and reduced hip compensations  Heel raises at counter w UE support 2x8 cues for pacing Sit to stand from standard chair x5, from raised mat 2x5 w B HHA to encourage forward trunk lean  LAQ 2x10 cues for foot positioning and pacing    DATE:  06/02/2023 Quadriceps sets 2 sets of 10 x 5 seconds Seated tailgate knee flexion AROM 1 minute  Recumbent bike Seat 4 for 5 minutes Level 2 Seated straight leg raises 3 sets of 5 for 3 seconds  Functional Activities: Double Leg Press full extension and slow eccentrics 75# 2 sets of  10 Single Leg Press  full extension and slow eccentrics 25# 10 x  Vaso right knee Medium 34* 10 minutes   05/30/2023 Quadriceps sets 10 x 5 seconds Seated tailgate knee flexion AROM 3 minutes    PATIENT EDUCATION:  Education details: rationale for interventions, HEP  Person educated: Patient and niece Education method: Explanation, Demonstration, Tactile cues, Verbal cues, and Handouts Education comprehension: verbalized understanding, returned demonstration, verbal cues required, tactile cues required, and needs further education  HOME EXERCISE PROGRAM: [id number]  ASSESSMENT:  CLINICAL IMPRESSION: Pt arrives w/o pain, continues to endorse steady improvement and no issues after last session. Today progressing familiar exercises and beginning to spend more time with closed chain activity which pt tolerates well. No adverse events, no pain during session or on departure. Recommend continuing along current POC in order to address relevant deficits and improve functional tolerance. Pt departs today's session in no acute distress, all voiced questions/concerns addressed appropriately from PT perspective.      OBJECTIVE IMPAIRMENTS: Abnormal gait, decreased activity tolerance, decreased endurance, decreased knowledge of condition, difficulty walking, decreased ROM, decreased strength, increased edema, impaired perceived functional ability, and pain.   ACTIVITY LIMITATIONS: sitting, standing, squatting, sleeping, stairs, and locomotion level  PARTICIPATION LIMITATIONS: driving and community activity  PERSONAL FACTORS: CKD, Type 2 diabetes, family history of bad reaction to anesthesia, HTN, Right TSA 2017 are also affecting patient's functional outcome.   REHAB POTENTIAL: Good  CLINICAL DECISION MAKING: Stable/uncomplicated  EVALUATION COMPLEXITY: Low   GOALS: Goals reviewed with patient? Yes  SHORT TERM GOALS: Target date: 06/26/3033 Jerita will be independent with her day 1 HEP Baseline: Started  05/30/2023 Goal status: Met 06/02/2023  2.  Improve right knee AROM to 5 - 0 - 120 degrees Baseline: 8 - 0 - 115 degrees Goal status: INITIAL  3.  Improve right quadriceps strength to at least 25 pounds Baseline: 16.8 pounds Goal status: INITIAL  LONG TERM GOALS: Target date: 07/25/2023  Improve FOTO to 64 in 12 visits Baseline: 47 Goal status: INITIAL  2.  Adelene will report right knee pain consistently 0-4/10 on the Numeric Pain Rating Scale Baseline: 3-6/10 Goal status: On Going 06/02/2023  3.  Improve knee AROM to 3 - 0 - 125 degrees Baseline: 8 - 0 - 115 degrees Goal status: INITIAL  4.  Improve quadriceps strength to 35 pounds or better for preparation to transfer into independent rehabilitation Baseline: 16.8 pounds Goal status: INITIAL  5.  Chasitie will be independent with her long-term HEP at DC Baseline: Started 05/30/2023 Goal status: INITIAL   PLAN:  PT FREQUENCY: 2x/week  PT DURATION: 8 weeks  PLANNED INTERVENTIONS: Therapeutic exercises, Therapeutic activity, Neuromuscular re-education, Balance training, Gait training, Patient/Family education, Self Care, Stair training, Cryotherapy, Vasopneumatic device, and Manual therapy  PLAN FOR NEXT SESSION: Review HEP.  Quadriceps strength emphasis to either avoid surgery or prepare for TKA.    Ashley Murrain PT, DPT 06/05/2023 4:03 PM

## 2023-06-08 NOTE — Therapy (Signed)
OUTPATIENT PHYSICAL THERAPY LOWER EXTREMITY TREATMENT   Patient Name: Dana Fleming MRN: [id number] DOB:February 23, 1941, 82 y.o., female Today's Date: 06/09/2023  END OF SESSION:  PT End of Session - 06/09/23 1340     Visit Number 4    Number of Visits 12    Date for PT Re-Evaluation 07/25/23    Authorization Type Medicare    Progress Note Due on Visit 10    PT Start Time 1341    PT Stop Time 1426    PT Time Calculation (min) 45 min    Activity Tolerance Patient tolerated treatment well;No increased pain    Behavior During Therapy WFL for tasks assessed/performed                Past Medical History:  Diagnosis Date   CKD (chronic kidney disease), stage III (HCC)    Diabetes mellitus without complication (HCC)    Type 2   Family history of adverse reaction to anesthesia    cousin didn't know he had sleep apnea and was on a ventilator for a month after   Hypertension    Sleep apnea    grandson has sleep apnea pt does not   Past Surgical History:  Procedure Laterality Date   CLOSED REDUCTION SHOULDER DISLOCATION     had conscious sedation   EYE SURGERY Bilateral    cataract surgery with lens implant   MULTIPLE TOOTH EXTRACTIONS     ROBOT ASSISTED LAPAROSCOPIC NEPHRECTOMY Right 03/29/2023   Procedure: XI ROBOTIC ASSISTED LAPAROSCOPIC NEPHRECTOMY;  Surgeon: Loletta Parish., MD;  Location: [facility];  Service: Urology;  Laterality: Right;  3 HRS   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   Procedure: REVERSE TOTAL SHOULDER ARTHROPLASTY;  Surgeon: Cammy Copa, MD;  Location: [facility];  Service: Orthopedics;  Laterality: Right;  REVERSE TOTAL SHOULDER ARTHROPLASTY   Patient Active Problem List   Diagnosis Date Noted   Renal mass 03/29/2023   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 Getting better at transfers, using foot cycle ergometer when sitting. No soreness or pain after last session, HEP going well.    PERTINENT HISTORY: CKD, Type 2 diabetes, family history of bad reaction to anesthesia, HTN, Right TSA 2017 PAIN:  Are you having pain? Yes: NPRS scale: 3-6/10 over the last 2 weeks on a 10/10 Pain location: Medial Pain description: Sharp, achy Aggravating factors: Prolonged weight-bearing, prolonged sitting Relieving factors: Cortisone shot, can find a comfortable position  PRECAUTIONS: None  WEIGHT BEARING RESTRICTIONS: No  FALLS:  Has patient fallen in last 6 months? Yes. Number of falls 1, tripped over something on her deck  LIVING ENVIRONMENT: Lives with: lives with their family and niece Lives in: House/apartment Stairs:  Needs a handrail Has following equipment at home: Single point cane and Tour manager  OCCUPATION: Retired  PLOF: Independent  PATIENT GOALS: Read, watch mysteries, go to the senior center  NEXT MD VISIT: None scheduled  OBJECTIVE: (objective measures completed at initial evaluation unless otherwise dated)   DIAGNOSTIC FINDINGS:  Radiographs of her right knee were obtained today.  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 with  bone-on-bone changes sclerotic changes and periarticular osteophytes  PATIENT SURVEYS:  FOTO 47 (Goal 64 in 12 visits)  COGNITION: Overall cognitive status: Within functional limits for tasks assessed     SENSATION: Notes some "tingling" distal from the knees to the toes  EDEMA:  Noted and not objectively  assessed 06/09/23 edema: L knee joint line 44cm   L knee 10cm below tibial tuberosity 33cm    R knee joint line 43.5cm   R knee 10cm below tibial tuberosity 35cm    LOWER EXTREMITY ROM:  Active ROM Left/Right 05/30/2023   Hip flexion    Hip extension    Hip abduction    Hip adduction    Hip internal rotation    Hip external rotation    Knee flexion 130/115   Knee extension 0/-8   Ankle dorsiflexion    Ankle plantarflexion    Ankle inversion    Ankle eversion     (Blank rows = not tested)  LOWER EXTREMITY STRENGTH:  In pounds assessed by hand-held dynamometer Left/Right 05/30/2023   Hip flexion    Hip extension    Hip abduction    Hip adduction    Hip internal rotation    Hip external rotation    Knee flexion    Knee extension 59.4/16.8   Ankle dorsiflexion    Ankle plantarflexion    Ankle inversion    Ankle eversion     (Blank rows = not tested)  GAIT: Distance walked: In the office Assistive device utilized: None Level of assistance: Complete Independence Comments: Decreased stance time and lateral lean noted on the right    TODAY'S TREATMENT:                                                                                                                              [facility] Adult PT Treatment:                                                DATE: 06/09/23 Therapeutic Exercise: Quad set w/ heel prop 5 sec 2x15 BIL LE Mini SLR 2x8 cues for form and maintaining extension  STS from lowest mat 3x5 no UE support or HHA cues for trunk lean Standing knee flexion RLE x15, RLE w 2# ankle weight in // bars for support  LAQ 2x15  HEP education  [facility] Adult PT Treatment:                                                DATE: 06/05/23 Therapeutic Exercise: Supine quad set x15 w  5 sec hold BIL LE Supine quad set with heel prop x8 cues for pacing Mini SLR 3x5 cues for foot positioning and pacing  Standing knee flexion RLE 2x12 cues for posture and reduced hip compensations  Heel  raises at counter w UE support 2x8 cues for pacing Sit to stand from standard chair x5, from raised mat 2x5 w B HHA to encourage forward trunk lean  LAQ 2x10 cues for foot positioning and pacing    DATE:  06/02/2023 Quadriceps sets 2 sets of 10 x 5 seconds Seated tailgate knee flexion AROM 1 minute  Recumbent bike Seat 4 for 5 minutes Level 2 Seated straight leg raises 3 sets of 5 for 3 seconds  Functional Activities: Double Leg Press full extension and slow eccentrics 75# 2 sets of 10 Single Leg Press full extension and slow eccentrics 25# 10 x  Vaso right knee Medium 34* 10 minutes   05/30/2023 Quadriceps sets 10 x 5 seconds Seated tailgate knee flexion AROM 3 minutes    PATIENT EDUCATION:  Education details: rationale for interventions, HEP  Person educated: Patient and niece Education method: Explanation, Demonstration, Tactile cues, Verbal cues, and Handouts Education comprehension: verbalized understanding, returned demonstration, verbal cues required, tactile cues required, and needs further education  HOME EXERCISE PROGRAM: [id number]  ASSESSMENT:  CLINICAL IMPRESSION: 06/09/2023 Pt arrives w/ report of no pain, continued improvement in symptoms overall. Today continuing gradual progression into more closed chain activity which pt tolerates well with cues as above. Also able to increase volume with open chain work. No adverse events, pt endorses no pain on departure. She does mention some swelling that appears most notable on medial aspect of R knee/calf - soft and nontender, no collateral superficial veins observed or pain with dorsiflexion, no erythema or notable warmth. Edema was noted in initial eval note, not formally measured - measurements as above today, education is provided on monitoring and appropriate action if red flags present themselves but appears non-concerning at this time based on Well's criteria and pt presentation. Recommend continuing along current POC in  order to address relevant deficits and improve functional tolerance. Pt departs today's session in no acute distress, all voiced questions/concerns addressed appropriately from PT perspective.       OBJECTIVE IMPAIRMENTS: Abnormal gait, decreased activity tolerance, decreased endurance, decreased knowledge of condition, difficulty walking, decreased ROM, decreased strength, increased edema, impaired perceived functional ability, and pain.   ACTIVITY LIMITATIONS: sitting, standing, squatting, sleeping, stairs, and locomotion level  PARTICIPATION LIMITATIONS: driving and community activity  PERSONAL FACTORS: CKD, Type 2 diabetes, family history of bad reaction to anesthesia, HTN, Right TSA 2017 are also affecting patient's functional outcome.   REHAB POTENTIAL: Good  CLINICAL DECISION MAKING: Stable/uncomplicated  EVALUATION COMPLEXITY: Low   GOALS: Goals reviewed with patient? Yes  SHORT TERM GOALS: Target date: 06/26/3033 Jancie will be independent with her day 1 HEP Baseline: Started 05/30/2023 Goal status: Met 06/02/2023  2.  Improve right knee AROM to 5 - 0 - 120 degrees Baseline: 8 - 0 - 115 degrees Goal status: INITIAL  3.  Improve right quadriceps strength to at least 25 pounds Baseline: 16.8 pounds Goal status: INITIAL  LONG TERM GOALS: Target date: 07/25/2023  Improve FOTO to 64 in 12 visits Baseline: 47 Goal status: INITIAL  2.  Naidelyn will report right knee pain consistently 0-4/10 on the Numeric Pain Rating Scale Baseline: 3-6/10 Goal status: On Going 06/02/2023  3.  Improve knee AROM to 3 - 0 - 125 degrees  Baseline: 8 - 0 - 115 degrees Goal status: INITIAL  4.  Improve quadriceps strength to 35 pounds or better for preparation to transfer into independent rehabilitation Baseline: 16.8 pounds Goal status: INITIAL  5.  Ajanae will be independent with her long-term HEP at [place] Baseline: Started 05/30/2023 Goal status: INITIAL   PLAN:  PT FREQUENCY: 2x/week  PT  DURATION: 8 weeks  PLANNED INTERVENTIONS: Therapeutic exercises, Therapeutic activity, Neuromuscular re-education, Balance training, Gait training, Patient/Family education, Self Care, Stair training, Cryotherapy, Vasopneumatic device, and Manual therapy  PLAN FOR NEXT SESSION: Review HEP.  Quadriceps strength emphasis to either avoid surgery or prepare for TKA.     Ashley Murrain PT, DPT 06/09/2023 4:19 PM

## 2023-06-09 ENCOUNTER — Encounter: Payer: Self-pay | Admitting: Physical Therapy

## 2023-06-09 ENCOUNTER — Ambulatory Visit (INDEPENDENT_AMBULATORY_CARE_PROVIDER_SITE_OTHER): Payer: Medicare Other | Admitting: Physical Therapy

## 2023-06-09 DIAGNOSIS — R600 Localized edema: Secondary | ICD-10-CM | POA: Diagnosis not present

## 2023-06-09 DIAGNOSIS — G8929 Other chronic pain: Secondary | ICD-10-CM | POA: Diagnosis not present

## 2023-06-09 DIAGNOSIS — M6281 Muscle weakness (generalized): Secondary | ICD-10-CM

## 2023-06-09 DIAGNOSIS — M25561 Pain in right knee: Secondary | ICD-10-CM | POA: Diagnosis not present

## 2023-06-09 DIAGNOSIS — R262 Difficulty in walking, not elsewhere classified: Secondary | ICD-10-CM

## 2023-06-09 DIAGNOSIS — M25661 Stiffness of right knee, not elsewhere classified: Secondary | ICD-10-CM | POA: Diagnosis not present

## 2023-06-12 ENCOUNTER — Encounter: Payer: Self-pay | Admitting: Physical Therapy

## 2023-06-12 ENCOUNTER — Ambulatory Visit (INDEPENDENT_AMBULATORY_CARE_PROVIDER_SITE_OTHER): Payer: Medicare Other | Admitting: Physical Therapy

## 2023-06-12 DIAGNOSIS — M25661 Stiffness of right knee, not elsewhere classified: Secondary | ICD-10-CM | POA: Diagnosis not present

## 2023-06-12 DIAGNOSIS — R262 Difficulty in walking, not elsewhere classified: Secondary | ICD-10-CM

## 2023-06-12 DIAGNOSIS — M25561 Pain in right knee: Secondary | ICD-10-CM

## 2023-06-12 DIAGNOSIS — R600 Localized edema: Secondary | ICD-10-CM

## 2023-06-12 DIAGNOSIS — G8929 Other chronic pain: Secondary | ICD-10-CM

## 2023-06-12 DIAGNOSIS — N1832 Chronic kidney disease, stage 3b: Secondary | ICD-10-CM | POA: Diagnosis not present

## 2023-06-12 DIAGNOSIS — M6281 Muscle weakness (generalized): Secondary | ICD-10-CM | POA: Diagnosis not present

## 2023-06-12 NOTE — Therapy (Signed)
OUTPATIENT PHYSICAL THERAPY LOWER EXTREMITY TREATMENT   Patient Name: Dana Fleming MRN: [id number] DOB:[birth year], 82 y.o., female Today's Date: 06/12/2023  END OF SESSION:  PT End of Session - 06/12/23 1507     Visit Number 5    Number of Visits 12    Date for PT Re-Evaluation 07/25/23    Authorization Type Medicare    Progress Note Due on Visit 10    PT Start Time 1512    PT Stop Time 1555    PT Time Calculation (min) 43 min    Activity Tolerance Patient tolerated treatment well;No increased pain    Behavior During Therapy WFL for tasks assessed/performed                 Past Medical History:  Diagnosis Date   CKD (chronic kidney disease), stage III (HCC)    Diabetes mellitus without complication (HCC)    Type 2   Family history of adverse reaction to anesthesia    cousin didn't know he had sleep apnea and was on a ventilator for a month after   Hypertension    Sleep apnea    grandson has sleep apnea pt does not   Past Surgical History:  Procedure Laterality Date   CLOSED REDUCTION SHOULDER DISLOCATION     had conscious sedation   EYE SURGERY Bilateral    cataract surgery with lens implant   MULTIPLE TOOTH EXTRACTIONS     ROBOT ASSISTED LAPAROSCOPIC NEPHRECTOMY Right 03/29/2023   Procedure: XI ROBOTIC ASSISTED LAPAROSCOPIC NEPHRECTOMY;  Surgeon: Loletta Parish., MD;  Location: [facility];  Service: Urology;  Laterality: Right;  3 HRS   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   Procedure: REVERSE TOTAL SHOULDER ARTHROPLASTY;  Surgeon: Cammy Copa, MD;  Location: [facility];  Service: Orthopedics;  Laterality: Right;  REVERSE TOTAL SHOULDER ARTHROPLASTY   Patient Active Problem List   Diagnosis Date Noted   Renal mass 03/29/2023   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 No pain at present, HEP going well. No other new updates, states she did monitor her swelling over the weekend and it seems to have improved   PERTINENT HISTORY: CKD, Type 2 diabetes, family history of bad reaction to anesthesia, HTN, Right TSA 2017 PAIN:  Are you having pain? Yes: NPRS scale: 3-6/10 over the last 2 weeks on a 10/10 Pain location: Medial Pain description: Sharp, achy Aggravating factors: Prolonged weight-bearing, prolonged sitting Relieving factors: Cortisone shot, can find a comfortable position  PRECAUTIONS: None  WEIGHT BEARING RESTRICTIONS: No  FALLS:  Has patient fallen in last 6 months? Yes. Number of falls 1, tripped over something on her deck  LIVING ENVIRONMENT: Lives with: lives with their family and niece Lives in: House/apartment Stairs:  Needs a handrail Has following equipment at home: Single point cane and Tour manager  OCCUPATION: Retired  PLOF: Independent  PATIENT GOALS: Read, watch mysteries, go to the senior center  NEXT MD VISIT: None scheduled  OBJECTIVE: (objective measures completed at initial evaluation unless otherwise  dated)   DIAGNOSTIC FINDINGS: Radiographs of her right knee were obtained today.  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 with  bone-on-bone changes sclerotic changes and periarticular osteophytes  PATIENT SURVEYS:  FOTO 47 (Goal 64 in 12 visits)  COGNITION: Overall cognitive status: Within functional limits for tasks assessed     SENSATION: Notes some "tingling" distal from the knees to the toes  EDEMA:  Noted and not objectively  assessed 06/09/23 edema: L knee joint line 44cm   L knee 10cm below tibial tuberosity 33cm    R knee joint line 43.5cm   R knee 10cm below tibial tuberosity 35cm   06/12/23: R knee joint line 40cm  R knee 10 cm below tibial tuberosity 35.5cm   LOWER EXTREMITY ROM:  Active ROM Left/Right 05/30/2023   Hip flexion    Hip extension    Hip abduction    Hip adduction    Hip internal rotation    Hip external rotation    Knee flexion 130/115   Knee extension 0/-8   Ankle dorsiflexion    Ankle plantarflexion    Ankle inversion    Ankle eversion     (Blank rows = not tested)  LOWER EXTREMITY STRENGTH:  In pounds assessed by hand-held dynamometer Left/Right 05/30/2023   Hip flexion    Hip extension    Hip abduction    Hip adduction    Hip internal rotation    Hip external rotation    Knee flexion    Knee extension 59.4/16.8   Ankle dorsiflexion    Ankle plantarflexion    Ankle inversion    Ankle eversion     (Blank rows = not tested)  GAIT: Distance walked: In the office Assistive device utilized: None Level of assistance: Complete Independence Comments: Decreased stance time and lateral lean noted on the right    TODAY'S TREATMENT:                                                                                                                              [facility] Adult PT Treatment:                                                DATE: 06/12/23 Therapeutic Exercise: Seated LAQ x20 BW, 3# x12 cues for foot positioning  Standing hamstring curl 3# ankle weight 2x12 RLE only 4 inch step up RLE leading 2x8  Airex step up RLE leading 2x10 w UE support  Standing heel raises 2x10 cues for reduced forward trunk lean Sit to stand (standard chair + airex) no UE support, 2x8  Education on relevant anatomy/physiology as it pertains to exercise in session, HEP, and symptoms    [facility] Adult PT Treatment:  DATE: 06/09/23 Therapeutic  Exercise: Quad set w/ heel prop 5 sec 2x15 BIL LE Mini SLR 2x8 cues for form and maintaining extension  STS from lowest mat 3x5 no UE support or HHA cues for trunk lean Standing knee flexion RLE x15, RLE w 2# ankle weight in // bars for support  LAQ 2x15  HEP education  [facility] Adult PT Treatment:                                                DATE: 06/05/23 Therapeutic Exercise: Supine quad set x15 w 5 sec hold BIL LE Supine quad set with heel prop x8 cues for pacing Mini SLR 3x5 cues for foot positioning and pacing  Standing knee flexion RLE 2x12 cues for posture and reduced hip compensations  Heel raises at counter w UE support 2x8 cues for pacing Sit to stand from standard chair x5, from raised mat 2x5 w B HHA to encourage forward trunk lean  LAQ 2x10 cues for foot positioning and pacing    DATE:  06/02/2023 Quadriceps sets 2 sets of 10 x 5 seconds Seated tailgate knee flexion AROM 1 minute  Recumbent bike Seat 4 for 5 minutes Level 2 Seated straight leg raises 3 sets of 5 for 3 seconds  Functional Activities: Double Leg Press full extension and slow eccentrics 75# 2 sets of 10 Single Leg Press full extension and slow eccentrics 25# 10 x  Vaso right knee Medium 34* 10 minutes   05/30/2023 Quadriceps sets 10 x 5 seconds Seated tailgate knee flexion AROM 3 minutes    PATIENT EDUCATION:  Education details: rationale for interventions, HEP  Person educated: Patient and niece Education method: Explanation, Demonstration, Tactile cues, Verbal cues, and Handouts Education comprehension: verbalized understanding, returned demonstration, verbal cues required, tactile cues required, and needs further education  HOME EXERCISE PROGRAM: [id number]  ASSESSMENT:  CLINICAL IMPRESSION: 06/12/2023 Pt arrives w/o pain, no issues after last session. Looking at knee swelling measurements, comparable to last session, exam remains reassuring. Today able to progress for increased time w/ closed  chain activities as pt is endorsing improving tolerance to activity overall but continuing to have some difficulty with balance. Tolerates today's session well, cues as above. Continues to demo altered gait kinematics and reduced LE strength/mobility, recommend continuing along current POC in order to address relevant deficits and improve functional tolerance. No adverse events, pt departs without pain. Pt departs today's session in no acute distress, all voiced questions/concerns addressed appropriately from PT perspective.     OBJECTIVE IMPAIRMENTS: Abnormal gait, decreased activity tolerance, decreased endurance, decreased knowledge of condition, difficulty walking, decreased ROM, decreased strength, increased edema, impaired perceived functional ability, and pain.   ACTIVITY LIMITATIONS: sitting, standing, squatting, sleeping, stairs, and locomotion level  PARTICIPATION LIMITATIONS: driving and community activity  PERSONAL FACTORS: CKD, Type 2 diabetes, family history of bad reaction to anesthesia, HTN, Right TSA 2017 are also affecting patient's functional outcome.   REHAB POTENTIAL: Good  CLINICAL DECISION MAKING: Stable/uncomplicated  EVALUATION COMPLEXITY: Low   GOALS: Goals reviewed with patient? Yes  SHORT TERM GOALS: Target date: 06/26/3033 Charlisha will be independent with her day 1 HEP Baseline: Started 05/30/2023 Goal status: Met 06/02/2023  2.  Improve right knee AROM to 5 - 0 - 120 degrees Baseline: 8 - 0 - 115 degrees Goal status: INITIAL  3.  Improve right  quadriceps strength to at least 25 pounds Baseline: 16.8 pounds Goal status: INITIAL  LONG TERM GOALS: Target date: 07/25/2023  Improve FOTO to 64 in 12 visits Baseline: 47 Goal status: INITIAL  2.  Kariah will report right knee pain consistently 0-4/10 on the Numeric Pain Rating Scale Baseline: 3-6/10 Goal status: On Going 06/02/2023  3.  Improve knee AROM to 3 - 0 - 125 degrees Baseline: 8 - 0 - 115 degrees Goal  status: INITIAL  4.  Improve quadriceps strength to 35 pounds or better for preparation to transfer into independent rehabilitation Baseline: 16.8 pounds Goal status: INITIAL  5.  Meriah will be independent with her long-term HEP at [place] Baseline: Started 05/30/2023 Goal status: INITIAL   PLAN:  PT FREQUENCY: 2x/week  PT DURATION: 8 weeks  PLANNED INTERVENTIONS: Therapeutic exercises, Therapeutic activity, Neuromuscular re-education, Balance training, Gait training, Patient/Family education, Self Care, Stair training, Cryotherapy, Vasopneumatic device, and Manual therapy  PLAN FOR NEXT SESSION: Review/update HEP PRN. Continue to progress for increased emphasis on functional strengthening and balance as able/appropriate.    Ashley Murrain PT, DPT 06/12/2023 4:00 PM

## 2023-06-15 ENCOUNTER — Ambulatory Visit (INDEPENDENT_AMBULATORY_CARE_PROVIDER_SITE_OTHER): Payer: Medicare Other | Admitting: Rehabilitative and Restorative Service Providers"

## 2023-06-15 ENCOUNTER — Encounter: Payer: Self-pay | Admitting: Rehabilitative and Restorative Service Providers"

## 2023-06-15 DIAGNOSIS — M25661 Stiffness of right knee, not elsewhere classified: Secondary | ICD-10-CM | POA: Diagnosis not present

## 2023-06-15 DIAGNOSIS — R600 Localized edema: Secondary | ICD-10-CM | POA: Diagnosis not present

## 2023-06-15 DIAGNOSIS — M25561 Pain in right knee: Secondary | ICD-10-CM | POA: Diagnosis not present

## 2023-06-15 DIAGNOSIS — R262 Difficulty in walking, not elsewhere classified: Secondary | ICD-10-CM | POA: Diagnosis not present

## 2023-06-15 DIAGNOSIS — M6281 Muscle weakness (generalized): Secondary | ICD-10-CM | POA: Diagnosis not present

## 2023-06-15 DIAGNOSIS — G8929 Other chronic pain: Secondary | ICD-10-CM

## 2023-06-15 NOTE — Therapy (Signed)
OUTPATIENT PHYSICAL THERAPY LOWER EXTREMITY TREATMENT   Patient Name: Dana Fleming MRN: [id number] DOB:09-Aug-1941, 82 y.o., female Today's Date: 06/15/2023  END OF SESSION:  PT End of Session - 06/15/23 1309     Visit Number 6    Number of Visits 12    Date for PT Re-Evaluation 07/25/23    Authorization Type Medicare    Authorization - Visit Number 6    Progress Note Due on Visit 10    PT Start Time 1307    PT Stop Time 1355    PT Time Calculation (min) 48 min    Activity Tolerance Patient tolerated treatment well;No increased pain    Behavior During Therapy WFL for tasks assessed/performed              Past Medical History:  Diagnosis Date   CKD (chronic kidney disease), stage III (HCC)    Diabetes mellitus without complication (HCC)    Type 2   Family history of adverse reaction to anesthesia    cousin didn't know he had sleep apnea and was on a ventilator for a month after   Hypertension    Sleep apnea    grandson has sleep apnea pt does not   Past Surgical History:  Procedure Laterality Date   CLOSED REDUCTION SHOULDER DISLOCATION     had conscious sedation   EYE SURGERY Bilateral    cataract surgery with lens implant   MULTIPLE TOOTH EXTRACTIONS     ROBOT ASSISTED LAPAROSCOPIC NEPHRECTOMY Right 03/29/2023   Procedure: XI ROBOTIC ASSISTED LAPAROSCOPIC NEPHRECTOMY;  Surgeon: Loletta Parish., MD;  Location: [facility];  Service: Urology;  Laterality: Right;  3 HRS   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   Procedure: REVERSE TOTAL SHOULDER ARTHROPLASTY;  Surgeon: Cammy Copa, MD;  Location: [facility];  Service: Orthopedics;  Laterality: Right;  REVERSE TOTAL SHOULDER ARTHROPLASTY   Patient Active Problem List   Diagnosis Date Noted   Renal mass 03/29/2023   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   PERTINENT HISTORY: CKD, Type 2 diabetes, family history of bad reaction to anesthesia, HTN, Right TSA 2017 PAIN:  Are you having pain? Yes: NPRS scale: 0-5/10 over the last week on a 10/10 Pain location: Medial Pain description: Sharp, achy Aggravating factors: Prolonged weight-bearing, prolonged sitting Relieving factors: Cortisone shot, can find a comfortable position  PRECAUTIONS: None  WEIGHT BEARING RESTRICTIONS: No  FALLS:  Has patient fallen in last 6 months? Yes. Number of falls 1, tripped over something on her deck  LIVING ENVIRONMENT: Lives with: lives with their family and niece Lives in: House/apartment Stairs:  Needs a handrail Has following equipment at home: Single point cane and Tour manager  OCCUPATION: Retired  PLOF: Independent  PATIENT GOALS: Read, watch mysteries, go to the senior center  NEXT MD VISIT: None scheduled  OBJECTIVE: (objective measures completed at initial evaluation unless otherwise dated)   DIAGNOSTIC FINDINGS: Radiographs of her right knee were obtained today.  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 with  bone-on-bone changes sclerotic changes and periarticular osteophytes  PATIENT SURVEYS:  FOTO 47 (Goal 64 in 12 visits)  COGNITION: Overall cognitive status: Within functional limits for tasks assessed     SENSATION: Notes some "tingling" distal from the knees to the toes  EDEMA:  Noted and not objectively assessed 06/09/23 edema: L knee joint line 44cm   L knee 10cm below tibial tuberosity 33cm    R knee joint line 43.5cm   R  knee 10cm below tibial tuberosity 35cm   06/12/23: R knee joint line 40cm  R knee 10 cm below tibial tuberosity 35.5cm   LOWER EXTREMITY ROM:  Active ROM Left/Right 05/30/2023 Left/Right 06/15/2023  Hip flexion    Hip extension    Hip abduction    Hip adduction    Hip internal rotation    Hip external rotation    Knee flexion 130/115 125/110  Knee extension 0/-8 0/-5  Ankle dorsiflexion    Ankle plantarflexion    Ankle inversion    Ankle eversion     (Blank rows = not tested)  LOWER EXTREMITY STRENGTH:  In pounds assessed by hand-held dynamometer Left/Right 05/30/2023   Hip flexion    Hip extension    Hip abduction    Hip adduction    Hip internal rotation    Hip external rotation    Knee flexion    Knee extension 59.4/16.8   Ankle dorsiflexion    Ankle plantarflexion    Ankle inversion    Ankle eversion     (Blank rows = not tested)  GAIT: Distance walked: In the office Assistive device utilized: None Level of assistance: Complete Independence Comments: Decreased stance time and lateral lean noted on the right    TODAY'S TREATMENT:                                                                                                                              [facility] Adult PT Treatment:                                                DATE: 06/15/2023 NuStep Level 5 for 5 minutes Quadriceps sets 10 x 5 seconds Seated tailgate knee flexion AROM 1 minute  -Recumbent bike Seat 4 for 5 minutes Level 2 (next visit) Seated straight leg raises 3 sets of 5 for 3 seconds  Functional Activities: Double Leg Press full extension and slow eccentrics 87# 2 sets of 10 Single Leg Press full extension and slow eccentrics 37# 10 x right only   Vaso right knee Medium 34* 10 minutes   06/12/23 Therapeutic Exercise: Seated LAQ x20 BW, 3# x12 cues for foot positioning  Standing hamstring curl 3# ankle weight 2x12 RLE only 4 inch step up RLE leading 2x8  Airex step up RLE leading  2x10 w  UE support  Standing heel raises 2x10 cues for reduced forward trunk lean Sit to stand (standard chair + airex) no UE support, 2x8  Education on relevant anatomy/physiology as it pertains to exercise in session, HEP, and symptoms   [facility] Adult PT Treatment:                                                DATE: 06/09/23 Therapeutic Exercise: Quad set w/ heel prop 5 sec 2x15 BIL LE Mini SLR 2x8 cues for form and maintaining extension  STS from lowest mat 3x5 no UE support or HHA cues for trunk lean Standing knee flexion RLE x15, RLE w 2# ankle weight in // bars for support  LAQ 2x15  HEP education   PATIENT EDUCATION:  Education details: rationale for interventions, HEP  Person educated: Patient and niece Education method: Explanation, Demonstration, Tactile cues, Verbal cues, and Handouts Education comprehension: verbalized understanding, returned demonstration, verbal cues required, tactile cues required, and needs further education  HOME EXERCISE PROGRAM: [id number]  ASSESSMENT:  CLINICAL IMPRESSION: [place] notes average HEP compliance, with some inconsistency.  Quadriceps strengthening remains a high priority and we spent a lot of time focused on strength activities she can do at home with her HEP.  Continued quadriceps strength progressions will benefit Ruwayda and help her meet long-term goals.  OBJECTIVE IMPAIRMENTS: Abnormal gait, decreased activity tolerance, decreased endurance, decreased knowledge of condition, difficulty walking, decreased ROM, decreased strength, increased edema, impaired perceived functional ability, and pain.   ACTIVITY LIMITATIONS: sitting, standing, squatting, sleeping, stairs, and locomotion level  PARTICIPATION LIMITATIONS: driving and community activity  PERSONAL FACTORS: CKD, Type 2 diabetes, family history of bad reaction to anesthesia, HTN, Right TSA 2017 are also affecting patient's functional outcome.   REHAB POTENTIAL: Good  CLINICAL DECISION  MAKING: Stable/uncomplicated  EVALUATION COMPLEXITY: Low   GOALS: Goals reviewed with patient? Yes  SHORT TERM GOALS: Target date: 06/26/3033 Amylee will be independent with her day 1 HEP Baseline: Started 05/30/2023 Goal status: Met 06/02/2023  2.  Improve right knee AROM to 5 - 0 - 120 degrees Baseline: 8 - 0 - 115 degrees Goal status: On Going 06/15/2023  3.  Improve right quadriceps strength to at least 25 pounds Baseline: 16.8 pounds Goal status: INITIAL  LONG TERM GOALS: Target date: 07/25/2023  Improve FOTO to 64 in 12 visits Baseline: 47 Goal status: INITIAL  2.  Shawnie will report right knee pain consistently 0-4/10 on the Numeric Pain Rating Scale Baseline: 3-6/10 Goal status: On Going 06/15/2023  3.  Improve knee AROM to 3 - 0 - 125 degrees Baseline: 8 - 0 - 115 degrees Goal status: INITIAL  4.  Improve quadriceps strength to 35 pounds or better for preparation to transfer into independent rehabilitation Baseline: 16.8 pounds Goal status: INITIAL  5.  Atlee will be independent with her long-term HEP at [place] Baseline: Started 05/30/2023 Goal status: INITIAL   PLAN:  PT FREQUENCY: 2x/week  PT DURATION: 8 weeks  PLANNED INTERVENTIONS: Therapeutic exercises, Therapeutic activity, Neuromuscular re-education, Balance training, Gait training, Patient/Family education, Self Care, Stair training, Cryotherapy, Vasopneumatic device, and Manual therapy  PLAN FOR NEXT SESSION: Review/update HEP PRN.  Continue emphasis on quadriceps strength with appropriate balance progressions as able/appropriate.   Cherlyn Cushing PT, MPT 06/15/2023 2:47 PM

## 2023-06-21 ENCOUNTER — Ambulatory Visit (INDEPENDENT_AMBULATORY_CARE_PROVIDER_SITE_OTHER): Payer: Medicare Other | Admitting: Rehabilitative and Restorative Service Providers"

## 2023-06-21 ENCOUNTER — Encounter: Payer: Self-pay | Admitting: Rehabilitative and Restorative Service Providers"

## 2023-06-21 DIAGNOSIS — M6281 Muscle weakness (generalized): Secondary | ICD-10-CM

## 2023-06-21 DIAGNOSIS — R600 Localized edema: Secondary | ICD-10-CM | POA: Diagnosis not present

## 2023-06-21 DIAGNOSIS — M25661 Stiffness of right knee, not elsewhere classified: Secondary | ICD-10-CM | POA: Diagnosis not present

## 2023-06-21 DIAGNOSIS — N1832 Chronic kidney disease, stage 3b: Secondary | ICD-10-CM | POA: Diagnosis not present

## 2023-06-21 DIAGNOSIS — M25561 Pain in right knee: Secondary | ICD-10-CM

## 2023-06-21 DIAGNOSIS — E1122 Type 2 diabetes mellitus with diabetic chronic kidney disease: Secondary | ICD-10-CM | POA: Diagnosis not present

## 2023-06-21 DIAGNOSIS — E875 Hyperkalemia: Secondary | ICD-10-CM | POA: Diagnosis not present

## 2023-06-21 DIAGNOSIS — R262 Difficulty in walking, not elsewhere classified: Secondary | ICD-10-CM

## 2023-06-21 DIAGNOSIS — G8929 Other chronic pain: Secondary | ICD-10-CM

## 2023-06-21 DIAGNOSIS — I129 Hypertensive chronic kidney disease with stage 1 through stage 4 chronic kidney disease, or unspecified chronic kidney disease: Secondary | ICD-10-CM | POA: Diagnosis not present

## 2023-06-21 DIAGNOSIS — D631 Anemia in chronic kidney disease: Secondary | ICD-10-CM | POA: Diagnosis not present

## 2023-06-21 DIAGNOSIS — N2889 Other specified disorders of kidney and ureter: Secondary | ICD-10-CM | POA: Diagnosis not present

## 2023-06-21 NOTE — Therapy (Signed)
OUTPATIENT PHYSICAL THERAPY LOWER EXTREMITY TREATMENT   Patient Name: Dana Fleming MRN: [id number] DOB:October 21, 1941, 82 y.o., female Today's Date: 06/21/2023  END OF SESSION:  PT End of Session - 06/21/23 1424     Visit Number 7    Number of Visits 12    Date for PT Re-Evaluation 07/25/23    Authorization Type Medicare    Authorization - Visit Number 7    Progress Note Due on Visit 10    PT Start Time 1424    PT Stop Time 1514    PT Time Calculation (min) 50 min    Activity Tolerance Patient tolerated treatment well;No increased pain    Behavior During Therapy WFL for tasks assessed/performed             Past Medical History:  Diagnosis Date   CKD (chronic kidney disease), stage III (HCC)    Diabetes mellitus without complication (HCC)    Type 2   Family history of adverse reaction to anesthesia    cousin didn't know he had sleep apnea and was on a ventilator for a month after   Hypertension    Sleep apnea    grandson has sleep apnea pt does not   Past Surgical History:  Procedure Laterality Date   CLOSED REDUCTION SHOULDER DISLOCATION     had conscious sedation   EYE SURGERY Bilateral    cataract surgery with lens implant   MULTIPLE TOOTH EXTRACTIONS     ROBOT ASSISTED LAPAROSCOPIC NEPHRECTOMY Right 03/29/2023   Procedure: XI ROBOTIC ASSISTED LAPAROSCOPIC NEPHRECTOMY;  Surgeon: Loletta Parish., MD;  Location: [facility];  Service: Urology;  Laterality: Right;  3 HRS   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   Procedure: REVERSE TOTAL SHOULDER ARTHROPLASTY;  Surgeon: Cammy Copa, MD;  Location: [facility];  Service: Orthopedics;  Laterality: Right;  REVERSE TOTAL SHOULDER ARTHROPLASTY   Patient Active Problem List   Diagnosis Date Noted   Renal mass 03/29/2023   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  She notes a significant improvement in her knee pain since starting PT.   PERTINENT HISTORY: CKD, Type 2 diabetes, family history of bad reaction to anesthesia, HTN, Right TSA 2017 PAIN:  Are you having pain? Yes: NPRS scale: 0-3/10 over the last week on a 10/10 Pain location: Medial Pain description: Achy Aggravating factors: Prolonged weight-bearing, prolonged sitting Relieving factors: Cortisone shot, can find a comfortable position  PRECAUTIONS: None  WEIGHT BEARING RESTRICTIONS: No  FALLS:  Has patient fallen in last 6 months? Yes. Number of falls 1, tripped over something on her deck  LIVING ENVIRONMENT: Lives with: lives with their family and niece Lives in: House/apartment Stairs:  Needs a handrail Has following equipment at home: Single point cane and Tour manager  OCCUPATION: Retired  PLOF: Independent  PATIENT GOALS: Read, watch mysteries, go to the senior center  NEXT MD VISIT: None scheduled  OBJECTIVE: (objective measures completed at initial evaluation unless otherwise dated)   DIAGNOSTIC FINDINGS: Radiographs of her right knee  were obtained today.  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 with  bone-on-bone changes sclerotic changes and periarticular osteophytes  PATIENT SURVEYS:  FOTO 47 (Goal 64 in 12 visits)  COGNITION: Overall cognitive status: Within functional limits for tasks assessed     SENSATION: Notes some "tingling" distal from the knees to the toes  EDEMA:  Noted and not objectively assessed 06/09/23 edema: L knee joint line 44cm   L  knee 10cm below tibial tuberosity 33cm    R knee joint line 43.5cm   R knee 10cm below tibial tuberosity 35cm   06/12/23: R knee joint line 40cm  R knee 10 cm below tibial tuberosity 35.5cm   LOWER EXTREMITY ROM:  Active ROM Left/Right 05/30/2023 Left/Right 06/15/2023  Hip flexion    Hip extension    Hip abduction    Hip adduction    Hip internal rotation    Hip external rotation    Knee flexion 130/115 125/110  Knee extension 0/-8 0/-5  Ankle dorsiflexion    Ankle plantarflexion    Ankle inversion    Ankle eversion     (Blank rows = not tested)  LOWER EXTREMITY STRENGTH:  In pounds assessed by hand-held dynamometer Left/Right 05/30/2023   Hip flexion    Hip extension    Hip abduction    Hip adduction    Hip internal rotation    Hip external rotation    Knee flexion    Knee extension 59.4/16.8   Ankle dorsiflexion    Ankle plantarflexion    Ankle inversion    Ankle eversion     (Blank rows = not tested)  GAIT: Distance walked: In the office Assistive device utilized: None Level of assistance: Complete Independence Comments: Decreased stance time and lateral lean noted on the right    TODAY'S TREATMENT:                                                                                                                              [facility] Adult PT Treatment:                                                DATE: 06/21/2023 Recumbent bike Seat 3 for 5 minutes Level 3 Quadriceps sets 2 sets of 10 x 5 seconds Seated tailgate knee flexion AROM 1 minute  Seated straight leg raises 6 sets of 5 for 3 seconds, last 3 sets with 1#  Functional Activities: Double Leg Press full extension and slow eccentrics 93# 2 sets of 10 Single Leg Press full extension and slow eccentrics 37# 10 x right only   Neuromuscular re-education: Tandem balance eyes open 4 x 20 seconds  Vaso right knee Medium 34* 10 minutes   06/15/2023 NuStep Level 5 for 5 minutes Quadriceps sets 10 x 5  seconds Seated tailgate knee flexion AROM 1 minute  -Recumbent bike Seat 4 for 5 minutes Level 2 (next visit) Seated straight leg raises 3 sets of 5 for 3 seconds  Functional Activities: Double Leg Press full extension and slow eccentrics 87# 2 sets of 10 Single Leg Press full extension and slow eccentrics 37# 10 x right only   Vaso right knee Medium 34* 10 minutes   06/12/23 Therapeutic Exercise: Seated LAQ x20 BW, 3# x12 cues for foot positioning  Standing hamstring curl 3# ankle weight 2x12 RLE only 4 inch step up RLE leading 2x8  Airex step up RLE leading 2x10 w UE support  Standing heel raises 2x10 cues for reduced forward trunk lean Sit to stand (standard chair + airex) no UE support, 2x8  Education on relevant anatomy/physiology as it pertains to exercise in session, HEP, and symptoms   PATIENT EDUCATION:  Education details: rationale for interventions, HEP  Person educated: Patient and niece Education method: Explanation, Demonstration, Tactile cues, Verbal cues, and Handouts Education comprehension: verbalized understanding, returned demonstration, verbal cues required, tactile cues required, and needs further education  HOME EXERCISE PROGRAM: [id number]  ASSESSMENT:  CLINICAL IMPRESSION: Dana Fleming notes continued "average" HEP compliance.  Knee pain is noticeably better and is more of an ache with no sharp pain in > 5 days.  Quadriceps strengthening remains the priority with her home and clinic program.  Continued quadriceps strength progressions will benefit Dana Fleming and help her meet long-term goals.  OBJECTIVE IMPAIRMENTS: Abnormal gait, decreased activity tolerance, decreased endurance, decreased knowledge of condition, difficulty walking, decreased ROM, decreased strength, increased edema, impaired perceived functional ability, and pain.   ACTIVITY LIMITATIONS: sitting, standing, squatting, sleeping, stairs, and locomotion level  PARTICIPATION LIMITATIONS: driving and  community activity  PERSONAL FACTORS: CKD, Type 2 diabetes, family history of bad reaction to anesthesia, HTN, Right TSA 2017 are also affecting patient's functional outcome.   REHAB POTENTIAL: Good  CLINICAL DECISION MAKING: Stable/uncomplicated  EVALUATION COMPLEXITY: Low   GOALS: Goals reviewed with patient? Yes  SHORT TERM GOALS: Target date: 06/26/3033 Dana Fleming will be independent with her day 1 HEP Baseline: Started 05/30/2023 Goal status: Met 06/02/2023  2.  Improve right knee AROM to 5 - 0 - 120 degrees Baseline: 8 - 0 - 115 degrees Goal status: On Going 06/15/2023  3.  Improve right quadriceps strength to at least 25 pounds Baseline: 16.8 pounds Goal status: INITIAL  LONG TERM GOALS: Target date: 07/25/2023  Improve FOTO to 64 in 12 visits Baseline: 47 Goal status: INITIAL  2.  Dana Fleming will report right knee pain consistently 0-4/10 on the Numeric Pain Rating Scale Baseline: 3-6/10 Goal status: Partially Met 06/21/2023  3.  Improve knee AROM to 3 - 0 - 125 degrees Baseline: 8 - 0 - 115 degrees Goal status: On Going 06/21/2023  4.  Improve quadriceps strength to 35 pounds or better for preparation to transfer into independent rehabilitation Baseline: 16.8 pounds Goal status: INITIAL  5.  Dana Fleming will be independent with her long-term HEP at [place] Baseline: Started 05/30/2023 Goal status: On Going 06/21/2023   PLAN:  PT FREQUENCY: 2x/week  PT DURATION: 8 weeks  PLANNED INTERVENTIONS: Therapeutic exercises, Therapeutic activity, Neuromuscular re-education, Balance training, Gait training, Patient/Family education, Self Care, Stair training, Cryotherapy, Vasopneumatic device, and Manual therapy  PLAN FOR NEXT SESSION: Review/update HEP PRN.  Continue emphasis on quadriceps strength with balance progressions as able/appropriate.   Cherlyn Cushing PT, MPT 06/21/2023 3:05 PM

## 2023-06-23 ENCOUNTER — Encounter: Payer: Self-pay | Admitting: Rehabilitative and Restorative Service Providers"

## 2023-06-23 ENCOUNTER — Ambulatory Visit (INDEPENDENT_AMBULATORY_CARE_PROVIDER_SITE_OTHER): Payer: Medicare Other | Admitting: Rehabilitative and Restorative Service Providers"

## 2023-06-23 DIAGNOSIS — R262 Difficulty in walking, not elsewhere classified: Secondary | ICD-10-CM

## 2023-06-23 DIAGNOSIS — M25561 Pain in right knee: Secondary | ICD-10-CM | POA: Diagnosis not present

## 2023-06-23 DIAGNOSIS — M25661 Stiffness of right knee, not elsewhere classified: Secondary | ICD-10-CM | POA: Diagnosis not present

## 2023-06-23 DIAGNOSIS — G8929 Other chronic pain: Secondary | ICD-10-CM | POA: Diagnosis not present

## 2023-06-23 DIAGNOSIS — M6281 Muscle weakness (generalized): Secondary | ICD-10-CM | POA: Diagnosis not present

## 2023-06-23 DIAGNOSIS — R600 Localized edema: Secondary | ICD-10-CM | POA: Diagnosis not present

## 2023-06-23 NOTE — Therapy (Signed)
OUTPATIENT PHYSICAL THERAPY LOWER EXTREMITY TREATMENT   Patient Name: Dana Fleming MRN: [id number] DOB:17-Apr-1941, 82 y.o., female Today's Date: 06/23/2023  END OF SESSION:  PT End of Session - 06/23/23 1339     Visit Number 8    Number of Visits 12    Date for PT Re-Evaluation 07/25/23    Authorization Type Medicare    Authorization - Visit Number 8    Progress Note Due on Visit 10    PT Start Time 1339    PT Stop Time 1424    PT Time Calculation (min) 45 min    Activity Tolerance Patient tolerated treatment well;No increased pain    Behavior During Therapy WFL for tasks assessed/performed              Past Medical History:  Diagnosis Date   CKD (chronic kidney disease), stage III (HCC)    Diabetes mellitus without complication (HCC)    Type 2   Family history of adverse reaction to anesthesia    cousin didn't know he had sleep apnea and was on a ventilator for a month after   Hypertension    Sleep apnea    grandson has sleep apnea pt does not   Past Surgical History:  Procedure Laterality Date   CLOSED REDUCTION SHOULDER DISLOCATION     had conscious sedation   EYE SURGERY Bilateral    cataract surgery with lens implant   MULTIPLE TOOTH EXTRACTIONS     ROBOT ASSISTED LAPAROSCOPIC NEPHRECTOMY Right 03/29/2023   Procedure: XI ROBOTIC ASSISTED LAPAROSCOPIC NEPHRECTOMY;  Surgeon: Loletta Parish., MD;  Location: [facility];  Service: Urology;  Laterality: Right;  3 HRS   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   Procedure: REVERSE TOTAL SHOULDER ARTHROPLASTY;  Surgeon: Cammy Copa, MD;  Location: [facility];  Service: Orthopedics;  Laterality: Right;  REVERSE TOTAL SHOULDER ARTHROPLASTY   Patient Active Problem List   Diagnosis Date Noted   Renal mass 03/29/2023   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  PERTINENT HISTORY: CKD, Type 2 diabetes, family history of bad reaction to anesthesia, HTN, Right TSA 2017 PAIN:  Are you having pain? Yes: NPRS scale: 0-3/10 over the last week on a 10/10 Pain location: Medial Pain description: Achy Aggravating factors: Prolonged weight-bearing, prolonged sitting Relieving factors: Cortisone shot, can find a comfortable position  PRECAUTIONS: None  WEIGHT BEARING RESTRICTIONS: No  FALLS:  Has patient fallen in last 6 months? Yes. Number of falls 1, tripped over something on her deck  LIVING ENVIRONMENT: Lives with: lives with their family and niece Lives in: House/apartment Stairs:  Needs a handrail Has following equipment at home: Single point cane and Tour manager  OCCUPATION: Retired  PLOF: Independent  PATIENT GOALS: Read, watch mysteries, go to the senior center  NEXT MD VISIT: None scheduled  OBJECTIVE: (objective measures completed at initial evaluation unless otherwise dated)   DIAGNOSTIC FINDINGS: Radiographs of her right knee were obtained today.  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 with  bone-on-bone changes sclerotic changes and periarticular osteophytes  PATIENT SURVEYS:  FOTO 47 (Goal 64 in 12 visits)  COGNITION: Overall cognitive status: Within functional limits for tasks assessed     SENSATION: Notes some "tingling" distal from the knees to the toes  EDEMA:  Noted and not objectively assessed 06/09/23 edema: L knee joint line 44cm   L knee 10cm below tibial tuberosity 33cm    R knee joint line  43.5cm   R knee 10cm below tibial tuberosity 35cm   06/12/23: R knee joint line 40cm  R knee 10 cm below tibial tuberosity 35.5cm   LOWER EXTREMITY ROM:  Active ROM Left/Right 05/30/2023 Left/Right 06/15/2023  Hip flexion    Hip extension    Hip abduction    Hip adduction    Hip internal rotation    Hip external rotation    Knee flexion 130/115 125/110  Knee extension 0/-8 0/-5  Ankle dorsiflexion    Ankle plantarflexion    Ankle inversion    Ankle eversion     (Blank rows = not tested)  LOWER EXTREMITY STRENGTH:  In pounds assessed by hand-held dynamometer Left/Right 05/30/2023 Left/Right 06/23/2023  Hip flexion    Hip extension    Hip abduction    Hip adduction    Hip internal rotation    Hip external rotation    Knee flexion    Knee extension 59.4/16.8 53.9/43.7  Ankle dorsiflexion    Ankle plantarflexion    Ankle inversion    Ankle eversion     (Blank rows = not tested)  GAIT: Distance walked: In the office Assistive device utilized: None Level of assistance: Complete Independence Comments: Decreased stance time and lateral lean noted on the right    TODAY'S TREATMENT:                                                                                                                              [facility] Adult PT Treatment:                                                DATE: 06/23/2023 Recumbent bike Seat 3 for 5 minutes Level 3 Quadriceps sets 2 sets of 10 x 5 seconds Seated tailgate knee flexion AROM 1 minute  Seated straight leg raises 6 sets of 5 for 3 seconds with 1.5#  Functional Activities: Double Leg Press full extension and slow eccentrics 93# 2 sets of 10 Single Leg Press full extension and slow eccentrics 37# 10 x right only   Neuromuscular re-education: Tandem balance eyes open 2 x 20 seconds   06/21/2023 Recumbent bike Seat 3 for 5 minutes Level 3 Quadriceps sets 2 sets of 10 x 5 seconds Seated tailgate knee flexion AROM 1 minute  Seated straight  leg raises 6 sets of  5 for 3 seconds, last 3 sets with 1#  Functional Activities: Double Leg Press full extension and slow eccentrics 93# 2 sets of 10 Single Leg Press full extension and slow eccentrics 37# 10 x right only   Neuromuscular re-education: Tandem balance eyes open 4 x 20 seconds  Vaso right knee Medium 34* 10 minutes   06/15/2023 NuStep Level 5 for 5 minutes Quadriceps sets 10 x 5 seconds Seated tailgate knee flexion AROM 1 minute  -Recumbent bike Seat 4 for 5 minutes Level 2 (next visit) Seated straight leg raises 3 sets of 5 for 3 seconds  Functional Activities: Double Leg Press full extension and slow eccentrics 87# 2 sets of 10 Single Leg Press full extension and slow eccentrics 37# 10 x right only   Vaso right knee Medium 34* 10 minutes   PATIENT EDUCATION:  Education details: rationale for interventions, HEP  Person educated: Patient and niece Education method: Explanation, Demonstration, Tactile cues, Verbal cues, and Handouts Education comprehension: verbalized understanding, returned demonstration, verbal cues required, tactile cues required, and needs further education  HOME EXERCISE PROGRAM: [id number]  ASSESSMENT:  CLINICAL IMPRESSION: Dana Fleming has had very little pain this week.  She is doing "something" every day with her HEP.  We discussed doing a little bit every day with the home exercises or do everything 3 days a week to continue strength gains and maintain progress achieved in supervised PT.  Dana Fleming would like to continue supervised PT for another 1-2 visits before reassessment and possible transition into independent PT.  OBJECTIVE IMPAIRMENTS: Abnormal gait, decreased activity tolerance, decreased endurance, decreased knowledge of condition, difficulty walking, decreased ROM, decreased strength, increased edema, impaired perceived functional ability, and pain.   ACTIVITY LIMITATIONS: sitting, standing, squatting, sleeping, stairs, and locomotion  level  PARTICIPATION LIMITATIONS: driving and community activity  PERSONAL FACTORS: CKD, Type 2 diabetes, family history of bad reaction to anesthesia, HTN, Right TSA 2017 are also affecting patient's functional outcome.   REHAB POTENTIAL: Good  CLINICAL DECISION MAKING: Stable/uncomplicated  EVALUATION COMPLEXITY: Low   GOALS: Goals reviewed with patient? Yes  SHORT TERM GOALS: Target date: 06/26/3033 Dana Fleming will be independent with her day 1 HEP Baseline: Started 05/30/2023 Goal status: Met 06/02/2023  2.  Improve right knee AROM to 5 - 0 - 120 degrees Baseline: 8 - 0 - 115 degrees Goal status: Partially Met 06/23/2023  3.  Improve right quadriceps strength to at least 25 pounds Baseline: 16.8 pounds Goal status: Met 06/23/2023  LONG TERM GOALS: Target date: 07/25/2023  Improve FOTO to 64 in 12 visits Baseline: 47 Goal status: Met 06/23/2023  2.  Dana Fleming will report right knee pain consistently 0-4/10 on the Numeric Pain Rating Scale Baseline: 3-6/10 Goal status: Met 06/23/2023  3.  Improve knee AROM to 3 - 0 - 125 degrees Baseline: 8 - 0 - 115 degrees Goal status: On Going 06/21/2023  4.  Improve quadriceps strength to 35 pounds or better for preparation to transfer into independent rehabilitation Baseline: 16.8 pounds Goal status: Met 06/23/2023  5.  Dana Fleming will be independent with her long-term HEP at DC Baseline: Started 05/30/2023 Goal status: On Going 06/23/2023   PLAN:  PT FREQUENCY: 2x/week  PT DURATION: 8 weeks  PLANNED INTERVENTIONS: Therapeutic exercises, Therapeutic activity, Neuromuscular re-education, Balance training, Gait training, Patient/Family education, Self Care, Stair training, Cryotherapy, Vasopneumatic device, and Manual therapy  PLAN FOR NEXT SESSION: Review/update HEP PRN.  Measure AROM.  Continue emphasis on quadriceps strength with balance progressions as able/appropriate.  DC in 1-2 visits likely.  Cherlyn Cushing PT, MPT 06/23/2023 2:28 PM

## 2023-06-26 ENCOUNTER — Encounter: Payer: Self-pay | Admitting: Physical Therapy

## 2023-06-26 ENCOUNTER — Ambulatory Visit (INDEPENDENT_AMBULATORY_CARE_PROVIDER_SITE_OTHER): Payer: Medicare Other | Admitting: Physical Therapy

## 2023-06-26 DIAGNOSIS — G8929 Other chronic pain: Secondary | ICD-10-CM

## 2023-06-26 DIAGNOSIS — R262 Difficulty in walking, not elsewhere classified: Secondary | ICD-10-CM | POA: Diagnosis not present

## 2023-06-26 DIAGNOSIS — M25561 Pain in right knee: Secondary | ICD-10-CM | POA: Diagnosis not present

## 2023-06-26 DIAGNOSIS — M6281 Muscle weakness (generalized): Secondary | ICD-10-CM | POA: Diagnosis not present

## 2023-06-26 DIAGNOSIS — R600 Localized edema: Secondary | ICD-10-CM | POA: Diagnosis not present

## 2023-06-26 DIAGNOSIS — M25661 Stiffness of right knee, not elsewhere classified: Secondary | ICD-10-CM

## 2023-06-26 NOTE — Therapy (Signed)
OUTPATIENT PHYSICAL THERAPY LOWER EXTREMITY TREATMENT   Patient Name: Dana Fleming MRN: [id number] DOB:December 15, 1940, 82 y.o., female Today's Date: 06/26/2023  END OF SESSION:  PT End of Session - 06/26/23 1506     Visit Number 9    Number of Visits 12    Date for PT Re-Evaluation 07/25/23    Authorization Type Medicare    Progress Note Due on Visit 10    PT Start Time 1510    PT Stop Time 1555    PT Time Calculation (min) 45 min    Activity Tolerance Patient tolerated treatment well;No increased pain    Behavior During Therapy WFL for tasks assessed/performed               Past Medical History:  Diagnosis Date   CKD (chronic kidney disease), stage III (HCC)    Diabetes mellitus without complication (HCC)    Type 2   Family history of adverse reaction to anesthesia    cousin didn't know he had sleep apnea and was on a ventilator for a month after   Hypertension    Sleep apnea    grandson has sleep apnea pt does not   Past Surgical History:  Procedure Laterality Date   CLOSED REDUCTION SHOULDER DISLOCATION     had conscious sedation   EYE SURGERY Bilateral    cataract surgery with lens implant   MULTIPLE TOOTH EXTRACTIONS     ROBOT ASSISTED LAPAROSCOPIC NEPHRECTOMY Right 03/29/2023   Procedure: XI ROBOTIC ASSISTED LAPAROSCOPIC NEPHRECTOMY;  Surgeon: Loletta Parish., MD;  Location: [facility];  Service: Urology;  Laterality: Right;  3 HRS   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   Procedure: REVERSE TOTAL SHOULDER ARTHROPLASTY;  Surgeon: Cammy Copa, MD;  Location: [facility];  Service: Orthopedics;  Laterality: Right;  REVERSE TOTAL SHOULDER ARTHROPLASTY   Patient Active Problem List   Diagnosis Date Noted   Renal mass 03/29/2023   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 "I forget it's there sometimes". States she is doing better with car and chair transfers. No more than 3/10 pain over past week.    PERTINENT HISTORY: CKD, Type 2 diabetes, family history of bad reaction to anesthesia, HTN, Right TSA 2017 PAIN:  Are you having pain? Yes: NPRS scale: 0-3/10 over the last week on a 10/10 Pain location: Medial Pain description: Achy Aggravating factors: Prolonged weight-bearing, prolonged sitting Relieving factors: Cortisone shot, can find a comfortable position  PRECAUTIONS: None  WEIGHT BEARING RESTRICTIONS: No  FALLS:  Has patient fallen in last 6 months? Yes. Number of falls 1, tripped over something on her deck  LIVING ENVIRONMENT: Lives with: lives with their family and niece Lives in: House/apartment Stairs:  Needs a handrail Has following equipment at home: Single point cane and Tour manager  OCCUPATION: Retired  PLOF: Independent  PATIENT GOALS: Read, watch mysteries, go to the senior center  NEXT MD VISIT: None scheduled  OBJECTIVE: (objective measures completed at initial evaluation unless otherwise dated)   DIAGNOSTIC FINDINGS: Radiographs of  her right knee were obtained today.  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 with  bone-on-bone changes sclerotic changes and periarticular osteophytes  PATIENT SURVEYS:  FOTO 47 (Goal 64 in 12 visits)   COGNITION: Overall cognitive status: Within functional limits for tasks assessed     SENSATION: Notes some "tingling" distal from the knees to the toes  EDEMA:  Noted and not objectively assessed 06/09/23 edema: L knee  joint line 44cm   L knee 10cm below tibial tuberosity 33cm    R knee joint line 43.5cm   R knee 10cm below tibial tuberosity 35cm   06/12/23: R knee joint line 40cm  R knee 10 cm below tibial tuberosity 35.5cm   LOWER EXTREMITY ROM:  Active ROM Left/Right 05/30/2023 Left/Right 06/15/2023  Hip flexion    Hip extension    Hip abduction    Hip adduction    Hip internal rotation    Hip external rotation    Knee flexion 130/115 125/110  Knee extension 0/-8 0/-5  Ankle dorsiflexion    Ankle plantarflexion    Ankle inversion    Ankle eversion     (Blank rows = not tested)  LOWER EXTREMITY STRENGTH:  In pounds assessed by hand-held dynamometer Left/Right 05/30/2023 Left/Right 06/23/2023  Hip flexion    Hip extension    Hip abduction    Hip adduction    Hip internal rotation    Hip external rotation    Knee flexion    Knee extension 59.4/16.8 53.9/43.7  Ankle dorsiflexion    Ankle plantarflexion    Ankle inversion    Ankle eversion     (Blank rows = not tested)  GAIT: Distance walked: In the office Assistive device utilized: None Level of assistance: Complete Independence Comments: Decreased stance time and lateral lean noted on the right    TODAY'S TREATMENT:                                                                                                                              [facility] Adult PT Treatment:                                                DATE: 06/26/23 Therapeutic Exercise: Scit fit LE only during subjective  Supine SLR x15 cues for pacing and appropriate ROM  Tailgate flexion/extension x15 TKE ball at wall 2x8 cues for quad Mini squat in // bars without UE support, 2x8 cues for controlled ROM HEP update + education/review, education on safe performance w/ tandem stance practice using counter and UE support  Neuromuscular re-ed: Tandem stance R foot fwd, 2x to fatigue L foot fwd (27 sec, ) Fwd/retro walking 2x3 laps (~12 ft) seated rest  breaks between to maximize performance. Cues for increased velocity with fwd walking, quick  stop on command to improve reactive balance, and retro walking for improved posterior chain activation   [facility] Adult PT Treatment:                                                DATE: 06/23/2023 Recumbent bike Seat 3 for 5 minutes Level 3 Quadriceps sets 2 sets of 10 x 5 seconds Seated tailgate knee flexion AROM 1 minute  Seated straight leg raises 6 sets of 5 for 3 seconds with 1.5#  Functional Activities: Double Leg Press full extension and slow eccentrics 93# 2 sets of 10 Single Leg Press full extension and slow eccentrics 37# 10 x right only   Neuromuscular re-education: Tandem balance eyes open 2 x 20 seconds   06/21/2023 Recumbent bike Seat 3 for 5 minutes Level 3 Quadriceps sets 2 sets of 10 x 5 seconds Seated tailgate knee flexion AROM 1 minute  Seated straight leg raises 6 sets of 5 for 3 seconds, last 3 sets with 1#  Functional Activities: Double Leg Press full extension and slow eccentrics 93# 2 sets of 10 Single Leg Press full extension and slow eccentrics 37# 10 x right only   Neuromuscular re-education: Tandem balance eyes open 4 x 20 seconds  Vaso right knee Medium 34* 10 minutes   06/15/2023 NuStep Level 5 for 5 minutes Quadriceps sets 10 x 5 seconds Seated tailgate knee flexion AROM 1 minute  -Recumbent bike Seat 4 for 5 minutes Level 2 (next visit) Seated straight leg raises 3 sets of 5 for 3 seconds  Functional Activities: Double Leg Press full extension and slow eccentrics 87# 2 sets of 10 Single Leg Press full extension and slow eccentrics 37# 10 x right only   Vaso right knee Medium 34* 10 minutes   PATIENT EDUCATION:  Education details: rationale for interventions, HEP  Person educated: Patient and niece Education method: Explanation, Demonstration, Tactile cues, Verbal cues, and Handouts Education comprehension: verbalized understanding, returned  demonstration, verbal cues required, tactile cues required, and needs further education  HOME EXERCISE PROGRAM: [id number]  ASSESSMENT:  CLINICAL IMPRESSION: Pt arrives without pain, continues to endorse steady progress. She notes that balance training last session seemed pretty helpful, continuing this today with progression for increased volume for tandem stance training. Also working on addition of fwd/retro walking, latter of which to improve posterior chain activation with dynamic tasks, improved performance noted with repetition. HEP update to include STS training and tandem stance, education on safe performance at home. If continues along current trajectory would likely be appropriate for discharge in coming visits. No adverse events, tolerates session well and departs without pain.    OBJECTIVE IMPAIRMENTS: Abnormal gait, decreased activity tolerance, decreased endurance, decreased knowledge of condition, difficulty walking, decreased ROM, decreased strength, increased edema, impaired perceived functional ability, and pain.   ACTIVITY LIMITATIONS: sitting, standing, squatting, sleeping, stairs, and locomotion level  PARTICIPATION LIMITATIONS: driving and community activity  PERSONAL FACTORS: CKD, Type 2 diabetes, family history of bad reaction to anesthesia, HTN, Right TSA 2017 are also affecting patient's functional outcome.   REHAB POTENTIAL: Good  CLINICAL DECISION MAKING: Stable/uncomplicated  EVALUATION COMPLEXITY: Low   GOALS: Goals reviewed with patient? Yes  SHORT TERM GOALS: Target date: 06/26/3033 [place] will be independent with her day 1 HEP Baseline: Started 05/30/2023 Goal status: Met 06/02/2023  2.  Improve right knee AROM to  5 - 0 - 120 degrees Baseline: 8 - 0 - 115 degrees Goal status: Partially Met 06/23/2023  3.  Improve right quadriceps strength to at least 25 pounds Baseline: 16.8 pounds Goal status: Met 06/23/2023  LONG TERM GOALS: Target date:  07/25/2023  Improve FOTO to 64 in 12 visits Baseline: 47 Goal status: Met 06/23/2023  2.  [place]kiah will report right knee pain consistently 0-4/10 on the Numeric Pain Rating Scale Baseline: 3-6/10 Goal status: Met 06/23/2023  3.  Improve knee AROM to 3 - 0 - 125 degrees Baseline: 8 - 0 - 115 degrees Goal status: On Going 06/21/2023  4.  Improve quadriceps strength to 35 pounds or better for preparation to transfer into independent rehabilitation Baseline: 16.8 pounds Goal status: Met 06/23/2023  5.  M[place] will be independent with her long-term HEP at DC Baseline: Started 05/30/2023 Goal status: On Going 06/23/2023   PLAN:  PT FREQUENCY: 2x/week  PT DURATION: 8 weeks  PLANNED INTERVENTIONS: Therapeutic exercises, Therapeutic activity, Neuromuscular re-education, Balance training, Gait training, Patient/Family education, Self Care, Stair training, Cryotherapy, Vasopneumatic device, and Manual therapy  PLAN FOR NEXT SESSION: Review/update HEP PRN.  FOTO/progress note activities. Consider discharge in coming visits if continues along current trajectory  Ashley Murrain PT, DPT 06/26/2023 4:05 PM

## 2023-06-28 ENCOUNTER — Ambulatory Visit (INDEPENDENT_AMBULATORY_CARE_PROVIDER_SITE_OTHER): Payer: Medicare Other | Admitting: Rehabilitative and Restorative Service Providers"

## 2023-06-28 ENCOUNTER — Encounter: Payer: Self-pay | Admitting: Rehabilitative and Restorative Service Providers"

## 2023-06-28 DIAGNOSIS — M25661 Stiffness of right knee, not elsewhere classified: Secondary | ICD-10-CM | POA: Diagnosis not present

## 2023-06-28 DIAGNOSIS — M25561 Pain in right knee: Secondary | ICD-10-CM

## 2023-06-28 DIAGNOSIS — M6281 Muscle weakness (generalized): Secondary | ICD-10-CM

## 2023-06-28 DIAGNOSIS — G8929 Other chronic pain: Secondary | ICD-10-CM | POA: Diagnosis not present

## 2023-06-28 DIAGNOSIS — R600 Localized edema: Secondary | ICD-10-CM | POA: Diagnosis not present

## 2023-06-28 DIAGNOSIS — R262 Difficulty in walking, not elsewhere classified: Secondary | ICD-10-CM

## 2023-06-28 NOTE — Therapy (Addendum)
OUTPATIENT PHYSICAL THERAPY LOWER EXTREMITY TREATMENT/PROGRESS NOTE   Patient Name: Dana Fleming MRN: [id number] DOB:[birth year], 82 y.o., female Today's Date: 06/28/2023  END OF SESSION:  PT End of Session - 06/28/23 1253     Visit Number 10    Number of Visits 12    Date for PT Re-Evaluation 07/25/23    Authorization Type Medicare    Authorization - Visit Number 10    Progress Note Due on Visit 10    PT Start Time 1253    PT Stop Time 1333    PT Time Calculation (min) 40 min    Activity Tolerance Patient tolerated treatment well;No increased pain    Behavior During Therapy [facility] for tasks assessed/performed            Progress Note Reporting Period 05/30/2023 to 06/28/2023  See note below for Objective Data and Assessment of Progress/Goals.      Past Medical History:  Diagnosis Date   CKD (chronic kidney disease), stage III (HCC)    Diabetes mellitus without complication (HCC)    Type 2   Family history of adverse reaction to anesthesia    cousin didn't know he had sleep apnea and was on a ventilator for a month after   Hypertension    Sleep apnea    grandson has sleep apnea pt does not   Past Surgical History:  Procedure Laterality Date   CLOSED REDUCTION SHOULDER DISLOCATION     had conscious sedation   EYE SURGERY Bilateral    cataract surgery with lens implant   MULTIPLE TOOTH EXTRACTIONS     ROBOT ASSISTED LAPAROSCOPIC NEPHRECTOMY Right 03/29/2023   Procedure: XI ROBOTIC ASSISTED LAPAROSCOPIC NEPHRECTOMY;  Surgeon: Loletta Parish., MD;  Location: [facility];  Service: Urology;  Laterality: Right;  3 HRS   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   Procedure: REVERSE TOTAL SHOULDER ARTHROPLASTY;  Surgeon: Cammy Copa, MD;  Location: [facility];  Service: Orthopedics;  Laterality: Right;  REVERSE TOTAL SHOULDER ARTHROPLASTY   Patient Active Problem List   Diagnosis Date Noted   Renal mass 03/29/2023   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  She "feels it" but doesn't have much pain.   PERTINENT HISTORY: CKD, Type 2 diabetes, family history of bad reaction to anesthesia, HTN, Right TSA 2017 PAIN:  Are you having pain? Yes: NPRS scale: 0-3/10 over the last week on a 10/10 Pain location: Medial Pain description: Achy Aggravating factors: Prolonged weight-bearing, prolonged sitting Relieving factors: Cortisone shot, can find a comfortable position  PRECAUTIONS: None  WEIGHT BEARING RESTRICTIONS: No  FALLS:  Has patient fallen in last 6 months? Yes. Number of falls 1, tripped over something on her deck  LIVING ENVIRONMENT: Lives with: lives with their family and niece Lives in: House/apartment Stairs:  Needs a handrail Has following equipment at home: Single point cane and Tour manager  OCCUPATION: Retired  PLOF: Independent  PATIENT GOALS: Read, watch mysteries, go to the senior center  NEXT MD VISIT: None scheduled  OBJECTIVE: (objective measures completed at initial evaluation unless otherwise dated)   DIAGNOSTIC FINDINGS: Radiographs of her right knee were obtained today.  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 with  bone-on-bone changes sclerotic changes and periarticular osteophytes  PATIENT SURVEYS:  06/28/2023 FOTO 77 (Goal met)  Eval FOTO 47 (Goal 64 in 12 visits)   COGNITION: Overall cognitive status: Within functional limits for tasks  assessed     SENSATION: Notes some "tingling" distal from the knees to the toes  EDEMA:  Noted and not objectively assessed 06/09/23 edema: L knee joint line 44cm   L knee 10cm below tibial tuberosity 33cm    R knee joint line 43.5cm   R knee 10cm below tibial tuberosity 35cm   06/12/23: R knee joint line 40cm  R knee 10 cm below tibial tuberosity 35.5cm   LOWER EXTREMITY ROM:  Active ROM Left/Right 05/30/2023 Left/Right 06/15/2023 Left/Right 06/28/2023  Hip flexion     Hip extension     Hip abduction     Hip adduction     Hip internal rotation     Hip external rotation     Knee flexion 130/115 125/110 131/121  Knee extension 0/-8 0/-5 0/-5  Ankle dorsiflexion     Ankle plantarflexion     Ankle inversion     Ankle eversion      (Blank rows = not tested)  LOWER EXTREMITY STRENGTH:  In pounds assessed by hand-held dynamometer Left/Right 05/30/2023 Left/Right 06/23/2023 Left/Right 06/28/2023  Hip flexion     Hip extension     Hip abduction     Hip adduction     Hip internal rotation     Hip external rotation     Knee flexion     Knee extension 59.4/16.8 53.9/43.7 74.5/58.8  Ankle dorsiflexion     Ankle plantarflexion     Ankle inversion     Ankle eversion      (Blank rows = not tested)  GAIT: Distance walked: In the office Assistive device utilized: None Level of assistance: Complete Independence Comments: Decreased stance time and lateral lean noted on the right    TODAY'S TREATMENT:                                                                                                                              [facility] Adult PT Treatment:                                                 06/28/2023 Recumbent bike Seat 3 for 5 minutes Level 3 Quadriceps sets 2 sets of 10 x 5 seconds Seated tailgate knee flexion AROM 1 minute  Seated straight leg raises 5 sets of 5 for 3 seconds with 1.5#  Functional Activities: Double Leg Press full extension and slow eccentrics  100# 2 sets  of 10 Single Leg Press full extension and slow eccentrics 43# 10 x right only    DATE: 06/26/23 Therapeutic Exercise: Scit fit LE only during subjective  Supine SLR x15 cues for pacing and appropriate ROM  Tailgate flexion/extension x15 TKE ball at wall 2x8 cues for quad Mini squat in // bars without UE support, 2x8 cues for controlled ROM HEP update + education/review, education on safe performance w/ tandem stance practice using counter and UE support  Neuromuscular re-ed: Tandem stance R foot fwd, 2x to fatigue L foot fwd (27 sec, ) Fwd/retro walking 2x3 laps (~12 ft) seated rest breaks between to maximize performance. Cues for increased velocity with fwd walking, quick stop on command to improve reactive balance, and retro walking for improved posterior chain activation   06/23/2023 Recumbent bike Seat 3 for 5 minutes Level 3 Quadriceps sets 2 sets of 10 x 5 seconds Seated tailgate knee flexion AROM 1 minute  Seated straight leg raises 6 sets of 5 for 3 seconds with 1.5#  Functional Activities: Double Leg Press full extension and slow eccentrics 93# 2 sets of 10 Single Leg Press full extension and slow eccentrics 37# 10 x right only   Neuromuscular re-education: Tandem balance eyes open 2 x 20 seconds   PATIENT EDUCATION:  Education details: rationale for interventions, HEP  Person educated: Patient and niece Education method: Explanation, Demonstration, Tactile cues, Verbal cues, and Handouts Education comprehension: verbalized understanding, returned demonstration, verbal cues required, tactile cues required, and needs further education  HOME EXERCISE PROGRAM: [id number]  ASSESSMENT:  CLINICAL IMPRESSION: Yatana has made excellent progress with her right knee AROM, quadriceps strength and self-reported function.  She is going to re-enroll at her gym this week and use her 1-2 appointments next week to ask questions about equipment and make sure she is  comfortable with her HEP.  I anticipate DC to independent rehabilitation in 1-2 visits.  OBJECTIVE IMPAIRMENTS: Abnormal gait, decreased activity tolerance, decreased endurance, decreased knowledge of condition, difficulty walking, decreased ROM, decreased strength, increased edema, impaired perceived functional ability, and pain.   ACTIVITY LIMITATIONS: sitting, standing, squatting, sleeping, stairs, and locomotion level  PARTICIPATION LIMITATIONS: driving and community activity  PERSONAL FACTORS: CKD, Type 2 diabetes, family history of bad reaction to anesthesia, HTN, Right TSA 2017 are also affecting patient's functional outcome.   REHAB POTENTIAL: Good  CLINICAL DECISION MAKING: Stable/uncomplicated  EVALUATION COMPLEXITY: Low   GOALS: Goals reviewed with patient? Yes  SHORT TERM GOALS: Target date: 06/26/3033 Orea will be independent with her day 1 HEP Baseline: Started 05/30/2023 Goal status: Met 06/02/2023  2.  Improve right knee AROM to 5 - 0 - 120 degrees Baseline: 8 - 0 - 115 degrees Goal status: Met 06/28/2023  3.  Improve right quadriceps strength to at least 25 pounds Baseline: 16.8 pounds Goal status: Met 06/23/2023  LONG TERM GOALS: Target date: 07/25/2023  Improve FOTO to 64 in 12 visits Baseline: 47 Goal status: Met 06/23/2023  2.  Keondra will report right knee pain consistently 0-4/10 on the Numeric Pain Rating Scale Baseline: 3-6/10 Goal status: Met 06/23/2023  3.  Improve knee AROM to 3 - 0 - 125 degrees Baseline: 8 - 0 - 115 degrees Goal status: On Going 06/28/2023  4.  Improve quadriceps strength to 35 pounds or better for preparation to transfer into independent rehabilitation Baseline: 16.8 pounds Goal status: Met 06/23/2023  5.  Runa will be independent with her long-term HEP at DC  Baseline: Started 05/30/2023 Goal status: On Going 06/28/2023   PLAN:  PT FREQUENCY: 1-2 x/week  PT DURATION: 1 additional week  PLANNED INTERVENTIONS: Therapeutic  exercises, Therapeutic activity, Neuromuscular re-education, Balance training, Gait training, Patient/Family education, Self Care, Stair training, Cryotherapy, Vasopneumatic device, and Manual therapy  PLAN FOR NEXT SESSION: DC when she is comfortable with her maintenance HEP (1-2 visits).  Cherlyn Cushing PT, MPT 06/28/2023 1:39 PM

## 2023-07-03 ENCOUNTER — Encounter: Payer: Self-pay | Admitting: Rehabilitative and Restorative Service Providers"

## 2023-07-03 ENCOUNTER — Ambulatory Visit (INDEPENDENT_AMBULATORY_CARE_PROVIDER_SITE_OTHER): Payer: Medicare Other | Admitting: Rehabilitative and Restorative Service Providers"

## 2023-07-03 DIAGNOSIS — M25661 Stiffness of right knee, not elsewhere classified: Secondary | ICD-10-CM | POA: Diagnosis not present

## 2023-07-03 DIAGNOSIS — G8929 Other chronic pain: Secondary | ICD-10-CM | POA: Diagnosis not present

## 2023-07-03 DIAGNOSIS — R600 Localized edema: Secondary | ICD-10-CM

## 2023-07-03 DIAGNOSIS — M25561 Pain in right knee: Secondary | ICD-10-CM | POA: Diagnosis not present

## 2023-07-03 DIAGNOSIS — R262 Difficulty in walking, not elsewhere classified: Secondary | ICD-10-CM | POA: Diagnosis not present

## 2023-07-03 DIAGNOSIS — M6281 Muscle weakness (generalized): Secondary | ICD-10-CM

## 2023-07-03 NOTE — Therapy (Signed)
OUTPATIENT PHYSICAL THERAPY LOWER EXTREMITY TREATMENT/DISCHARGE NOTE  PHYSICAL THERAPY DISCHARGE SUMMARY  Visits from Start of Care: 11  Current functional level related to goals / functional outcomes: See note   Remaining deficits: See note   Education / Equipment: Updated HEP   Patient agrees to discharge. Patient goals were met. Patient is being discharged due to being pleased with the current functional level.   Patient Name: Dana Fleming MRN: [id number] DOB:12/09/1940, 82 y.o., female Today's Date: 07/03/2023  END OF SESSION:  PT End of Session - 07/03/23 1305     Visit Number 11    Number of Visits 12    Date for PT Re-Evaluation 07/25/23    Authorization Type Medicare    Authorization - Visit Number 11    Progress Note Due on Visit 12    PT Start Time 1305    PT Stop Time 1345    PT Time Calculation (min) 40 min    Activity Tolerance Patient tolerated treatment well;No increased pain    Behavior During Therapy [facility] for tasks assessed/performed             Past Medical History:  Diagnosis Date   CKD (chronic kidney disease), stage III (HCC)    Diabetes mellitus without complication (HCC)    Type 2   Family history of adverse reaction to anesthesia    cousin didn't know he had sleep apnea and was on a ventilator for a month after   Hypertension    Sleep apnea    grandson has sleep apnea pt does not   Past Surgical History:  Procedure Laterality Date   CLOSED REDUCTION SHOULDER DISLOCATION     had conscious sedation   EYE SURGERY Bilateral    cataract surgery with lens implant   MULTIPLE TOOTH EXTRACTIONS     ROBOT ASSISTED LAPAROSCOPIC NEPHRECTOMY Right 03/29/2023   Procedure: XI ROBOTIC ASSISTED LAPAROSCOPIC NEPHRECTOMY;  Surgeon: Loletta Parish., MD;  Location: [facility];  Service: Urology;  Laterality: Right;  3 HRS   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   Procedure: REVERSE TOTAL SHOULDER ARTHROPLASTY;  Surgeon: Cammy Copa, MD;   Location: [facility];  Service: Orthopedics;  Laterality: Right;  REVERSE TOTAL SHOULDER ARTHROPLASTY   Patient Active Problem List   Diagnosis Date Noted   Renal mass 03/29/2023   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  She went back to the senior center and she was able to use the equipment without incident.   PERTINENT HISTORY: CKD, Type 2 diabetes, family history of bad reaction to anesthesia, HTN, Right TSA 2017 PAIN:  Are you having pain? Yes: NPRS scale: 0/10 over the last week on a 10/10 Pain location: Medial Pain description: Achy Aggravating factors: Prolonged weight-bearing, prolonged sitting Relieving factors: Cortisone shot, can find a comfortable position  PRECAUTIONS: None  WEIGHT BEARING RESTRICTIONS: No  FALLS:  Has patient fallen in last 6 months? Yes. Number of falls 1, tripped over something on her deck  LIVING ENVIRONMENT: Lives with: lives with  their family and niece Lives in: House/apartment Stairs:  Needs a handrail Has following equipment at home: Single point cane and Shower bench  OCCUPATION: Retired  PLOF: Independent  PATIENT GOALS: Read, watch mysteries, go to the senior center  NEXT MD VISIT: None scheduled  OBJECTIVE: (objective measures completed at initial evaluation unless otherwise dated)   DIAGNOSTIC FINDINGS: Radiographs of her right knee were obtained today.  She has  tricompartmental arthritis most severe in the  lateral compartment with  bone-on-bone changes sclerotic changes and periarticular osteophytes  PATIENT SURVEYS:  06/28/2023 FOTO 77 (Goal met)  Eval FOTO 47 (Goal 64 in 12 visits)   COGNITION: Overall cognitive status: Within functional limits for tasks assessed     SENSATION: Notes some "tingling" distal from the knees to the toes  EDEMA:  Noted and not objectively assessed 06/09/23 edema: L knee joint line 44cm   L knee 10cm below tibial tuberosity 33cm    R knee joint line 43.5cm   R knee 10cm below tibial tuberosity 35cm   06/12/23: R knee joint line 40cm  R knee 10 cm below tibial tuberosity 35.5cm   LOWER EXTREMITY ROM:  Active ROM Left/Right 05/30/2023 Left/Right 06/15/2023 Left/Right 06/28/2023  Hip flexion     Hip extension     Hip abduction     Hip adduction     Hip internal rotation     Hip external rotation     Knee flexion 130/115 125/110 131/121  Knee extension 0/-8 0/-5 0/-5  Ankle dorsiflexion     Ankle plantarflexion     Ankle inversion     Ankle eversion      (Blank rows = not tested)  LOWER EXTREMITY STRENGTH:  In pounds assessed by hand-held dynamometer Left/Right 05/30/2023 Left/Right 06/23/2023 Left/Right 06/28/2023  Hip flexion     Hip extension     Hip abduction     Hip adduction     Hip internal rotation     Hip external rotation     Knee flexion     Knee extension 59.4/16.8 53.9/43.7 74.5/58.8  Ankle dorsiflexion     Ankle plantarflexion     Ankle inversion     Ankle eversion      (Blank rows = not tested)  GAIT: Distance walked: In the office Assistive device utilized: None Level of assistance: Complete Independence Comments: Decreased stance time and lateral lean noted on the right    TODAY'S TREATMENT:                                                                                                                              [facility] Adult PT Treatment:                                                 07/03/2023 Recumbent bike Seat  3  for 5 minutes Level 3 Quadriceps sets 2 sets of 10 x 5 seconds Seated tailgate knee flexion AROM 1 minute  Seated straight leg raises 3 sets of 10 for 3 seconds with 2#  Functional Activities: Double Leg Press full extension and slow eccentrics 100# 2 sets of 10 Single Leg Press full extension and slow eccentrics 43# 10 x right only   Neuromuscular re-education: Tandem balance 5 x 20 seconds   06/28/2023 Recumbent bike Seat 3 for 5 minutes Level 3 Quadriceps sets 2 sets of 10 x 5 seconds Seated tailgate knee flexion AROM 1 minute  Seated straight leg raises 5 sets of 5 for 3 seconds with 1.5#  Functional Activities: Double Leg Press full extension and slow eccentrics 100# 2 sets of 10 Single Leg Press full extension and slow eccentrics 43# 10 x right only    DATE: 06/26/23 Therapeutic Exercise: Scit fit LE only during subjective  Supine SLR x15 cues for pacing and appropriate ROM  Tailgate flexion/extension x15 TKE ball at wall 2x8 cues for quad Mini squat in // bars without UE support, 2x8 cues for controlled ROM HEP update + education/review, education on safe performance w/ tandem stance practice using counter and UE support  Neuromuscular re-ed: Tandem stance R foot fwd, 2x to fatigue L foot fwd (27 sec, ) Fwd/retro walking 2x3 laps (~12 ft) seated rest breaks between to maximize performance. Cues for increased velocity with fwd walking, quick stop on command to improve reactive balance, and retro walking for improved posterior chain activation   PATIENT EDUCATION:  Education details: rationale for interventions, HEP  Person educated: Patient and niece Education method: Explanation, Demonstration, Tactile cues, Verbal cues, and Handouts Education comprehension: verbalized understanding, returned demonstration, verbal cues required, tactile cues required, and needs further education  HOME EXERCISE PROGRAM: [id number]  ASSESSMENT:  CLINICAL  IMPRESSION: Nevaeha has met long-term goals and is comfortable with her home and gym exercise program.  She has chosen to continue her exercises independently and is welcome to return if needed.  OBJECTIVE IMPAIRMENTS: Abnormal gait, decreased activity tolerance, decreased endurance, decreased knowledge of condition, difficulty walking, decreased ROM, decreased strength, increased edema, impaired perceived functional ability, and pain.   ACTIVITY LIMITATIONS: sitting, standing, squatting, sleeping, stairs, and locomotion level  PARTICIPATION LIMITATIONS: driving and community activity  PERSONAL FACTORS: CKD, Type 2 diabetes, family history of bad reaction to anesthesia, HTN, Right TSA 2017 are also affecting patient's functional outcome.   REHAB POTENTIAL: Good  CLINICAL DECISION MAKING: Stable/uncomplicated  EVALUATION COMPLEXITY: Low   GOALS: Goals reviewed with patient? Yes  SHORT TERM GOALS: Target date: 06/26/3033 Jaselyn will be independent with her day 1 HEP Baseline: Started 05/30/2023 Goal status: Met 06/02/2023  2.  Improve right knee AROM to 5 - 0 - 120 degrees Baseline: 8 - 0 - 115 degrees Goal status: Met 06/28/2023  3.  Improve right quadriceps strength to at least 25 pounds Baseline: 16.8 pounds Goal status: Met 06/23/2023  LONG TERM GOALS: Target date: 07/25/2023  Improve FOTO to 64 in 12 visits Baseline: 47 Goal status: Met 06/23/2023  2.  Terrion will report right knee pain consistently 0-4/10 on the Numeric Pain Rating Scale Baseline: 3-6/10 Goal status: Met 06/23/2023  3.  Improve knee AROM to 3 - 0 - 125 degrees Baseline: 8 - 0 - 115 degrees Goal status: On Going 06/28/2023  4.  Improve quadriceps strength to 35 pounds or better for preparation to transfer into independent rehabilitation Baseline:  16.8 pounds Goal status: Met 06/23/2023  5.  Yurianna will be independent with her long-term HEP at DC Baseline: Started 05/30/2023 Goal status: Met 07/03/2023   PLAN:  PT  FREQUENCY: DC  PT DURATION: DC  PLANNED INTERVENTIONS: Therapeutic exercises, Therapeutic activity, Neuromuscular re-education, Balance training, Gait training, Patient/Family education, Self Care, Stair training, Cryotherapy, Vasopneumatic device, and Manual therapy  PLAN FOR NEXT SESSION: DC   Cherlyn Cushing PT, MPT 07/03/2023 3:00 PM

## 2023-07-05 ENCOUNTER — Encounter: Payer: Medicare Other | Admitting: Rehabilitative and Restorative Service Providers"

## 2023-07-18 DIAGNOSIS — C641 Malignant neoplasm of right kidney, except renal pelvis: Secondary | ICD-10-CM | POA: Diagnosis not present

## 2023-07-22 ENCOUNTER — Ambulatory Visit
Admission: EM | Admit: 2023-07-22 | Discharge: 2023-07-22 | Disposition: A | Discharge status: Home / Self Care | Payer: Medicare Other

## 2023-07-22 ENCOUNTER — Emergency Department (HOSPITAL_COMMUNITY): Payer: Medicare Other

## 2023-07-22 ENCOUNTER — Other Ambulatory Visit: Payer: Self-pay

## 2023-07-22 ENCOUNTER — Inpatient Hospital Stay (HOSPITAL_COMMUNITY): Payer: Medicare Other

## 2023-07-22 ENCOUNTER — Inpatient Hospital Stay (HOSPITAL_COMMUNITY)
Admission: EM | Admit: 2023-07-22 | Discharge: 2023-07-30 | DRG: 299 | Disposition: A | Discharge status: Home Health | Payer: Medicare Other | Attending: Internal Medicine | Admitting: Internal Medicine

## 2023-07-22 DIAGNOSIS — Z1152 Encounter for screening for COVID-19: Secondary | ICD-10-CM

## 2023-07-22 DIAGNOSIS — J181 Lobar pneumonia, unspecified organism: Secondary | ICD-10-CM | POA: Diagnosis present

## 2023-07-22 DIAGNOSIS — I700 Atherosclerosis of aorta: Secondary | ICD-10-CM | POA: Diagnosis not present

## 2023-07-22 DIAGNOSIS — Z8249 Family history of ischemic heart disease and other diseases of the circulatory system: Secondary | ICD-10-CM

## 2023-07-22 DIAGNOSIS — J984 Other disorders of lung: Secondary | ICD-10-CM | POA: Diagnosis not present

## 2023-07-22 DIAGNOSIS — R040 Epistaxis: Secondary | ICD-10-CM | POA: Diagnosis present

## 2023-07-22 DIAGNOSIS — I129 Hypertensive chronic kidney disease with stage 1 through stage 4 chronic kidney disease, or unspecified chronic kidney disease: Secondary | ICD-10-CM | POA: Diagnosis present

## 2023-07-22 DIAGNOSIS — Z6834 Body mass index (BMI) 34.0-34.9, adult: Secondary | ICD-10-CM

## 2023-07-22 DIAGNOSIS — M79661 Pain in right lower leg: Secondary | ICD-10-CM | POA: Diagnosis not present

## 2023-07-22 DIAGNOSIS — I2609 Other pulmonary embolism with acute cor pulmonale: Secondary | ICD-10-CM | POA: Diagnosis not present

## 2023-07-22 DIAGNOSIS — I82401 Acute embolism and thrombosis of unspecified deep veins of right lower extremity: Secondary | ICD-10-CM | POA: Diagnosis not present

## 2023-07-22 DIAGNOSIS — E8721 Acute metabolic acidosis: Secondary | ICD-10-CM | POA: Diagnosis not present

## 2023-07-22 DIAGNOSIS — D509 Iron deficiency anemia, unspecified: Secondary | ICD-10-CM | POA: Diagnosis present

## 2023-07-22 DIAGNOSIS — I82441 Acute embolism and thrombosis of right tibial vein: Principal | ICD-10-CM | POA: Diagnosis present

## 2023-07-22 DIAGNOSIS — J189 Pneumonia, unspecified organism: Secondary | ICD-10-CM | POA: Diagnosis not present

## 2023-07-22 DIAGNOSIS — J9601 Acute respiratory failure with hypoxia: Secondary | ICD-10-CM | POA: Diagnosis present

## 2023-07-22 DIAGNOSIS — N281 Cyst of kidney, acquired: Secondary | ICD-10-CM | POA: Diagnosis not present

## 2023-07-22 DIAGNOSIS — R0602 Shortness of breath: Secondary | ICD-10-CM

## 2023-07-22 DIAGNOSIS — Z823 Family history of stroke: Secondary | ICD-10-CM

## 2023-07-22 DIAGNOSIS — Z87891 Personal history of nicotine dependence: Secondary | ICD-10-CM

## 2023-07-22 DIAGNOSIS — N179 Acute kidney failure, unspecified: Secondary | ICD-10-CM | POA: Diagnosis present

## 2023-07-22 DIAGNOSIS — Z96611 Presence of right artificial shoulder joint: Secondary | ICD-10-CM | POA: Diagnosis not present

## 2023-07-22 DIAGNOSIS — R059 Cough, unspecified: Secondary | ICD-10-CM | POA: Diagnosis not present

## 2023-07-22 DIAGNOSIS — I10 Essential (primary) hypertension: Secondary | ICD-10-CM | POA: Diagnosis not present

## 2023-07-22 DIAGNOSIS — R918 Other nonspecific abnormal finding of lung field: Secondary | ICD-10-CM | POA: Diagnosis not present

## 2023-07-22 DIAGNOSIS — E785 Hyperlipidemia, unspecified: Secondary | ICD-10-CM | POA: Diagnosis not present

## 2023-07-22 DIAGNOSIS — R7989 Other specified abnormal findings of blood chemistry: Secondary | ICD-10-CM | POA: Diagnosis not present

## 2023-07-22 DIAGNOSIS — I5A Non-ischemic myocardial injury (non-traumatic): Secondary | ICD-10-CM | POA: Insufficient documentation

## 2023-07-22 DIAGNOSIS — Z961 Presence of intraocular lens: Secondary | ICD-10-CM | POA: Diagnosis present

## 2023-07-22 DIAGNOSIS — I2699 Other pulmonary embolism without acute cor pulmonale: Secondary | ICD-10-CM

## 2023-07-22 DIAGNOSIS — Z9841 Cataract extraction status, right eye: Secondary | ICD-10-CM

## 2023-07-22 DIAGNOSIS — D649 Anemia, unspecified: Secondary | ICD-10-CM | POA: Diagnosis not present

## 2023-07-22 DIAGNOSIS — E1122 Type 2 diabetes mellitus with diabetic chronic kidney disease: Secondary | ICD-10-CM

## 2023-07-22 DIAGNOSIS — I82409 Acute embolism and thrombosis of unspecified deep veins of unspecified lower extremity: Secondary | ICD-10-CM | POA: Diagnosis not present

## 2023-07-22 DIAGNOSIS — Z888 Allergy status to other drugs, medicaments and biological substances status: Secondary | ICD-10-CM | POA: Diagnosis not present

## 2023-07-22 DIAGNOSIS — Z905 Acquired absence of kidney: Secondary | ICD-10-CM

## 2023-07-22 DIAGNOSIS — Z85528 Personal history of other malignant neoplasm of kidney: Secondary | ICD-10-CM

## 2023-07-22 DIAGNOSIS — R0600 Dyspnea, unspecified: Secondary | ICD-10-CM

## 2023-07-22 DIAGNOSIS — E872 Acidosis, unspecified: Secondary | ICD-10-CM | POA: Insufficient documentation

## 2023-07-22 DIAGNOSIS — R0902 Hypoxemia: Secondary | ICD-10-CM | POA: Diagnosis not present

## 2023-07-22 DIAGNOSIS — I2489 Other forms of acute ischemic heart disease: Secondary | ICD-10-CM | POA: Diagnosis present

## 2023-07-22 DIAGNOSIS — R7981 Abnormal blood-gas level: Secondary | ICD-10-CM | POA: Diagnosis not present

## 2023-07-22 DIAGNOSIS — I82451 Acute embolism and thrombosis of right peroneal vein: Secondary | ICD-10-CM | POA: Diagnosis present

## 2023-07-22 DIAGNOSIS — Z79899 Other long term (current) drug therapy: Secondary | ICD-10-CM | POA: Diagnosis not present

## 2023-07-22 DIAGNOSIS — R0603 Acute respiratory distress: Secondary | ICD-10-CM | POA: Diagnosis not present

## 2023-07-22 DIAGNOSIS — Z9842 Cataract extraction status, left eye: Secondary | ICD-10-CM

## 2023-07-22 DIAGNOSIS — E669 Obesity, unspecified: Secondary | ICD-10-CM | POA: Diagnosis present

## 2023-07-22 DIAGNOSIS — I824Y1 Acute embolism and thrombosis of unspecified deep veins of right proximal lower extremity: Secondary | ICD-10-CM | POA: Diagnosis not present

## 2023-07-22 DIAGNOSIS — E869 Volume depletion, unspecified: Secondary | ICD-10-CM | POA: Diagnosis not present

## 2023-07-22 DIAGNOSIS — Z833 Family history of diabetes mellitus: Secondary | ICD-10-CM

## 2023-07-22 DIAGNOSIS — N1832 Chronic kidney disease, stage 3b: Secondary | ICD-10-CM | POA: Diagnosis not present

## 2023-07-22 DIAGNOSIS — D631 Anemia in chronic kidney disease: Secondary | ICD-10-CM | POA: Diagnosis present

## 2023-07-22 LAB — COMPREHENSIVE METABOLIC PANEL
ALT: 15 U/L (ref 0–44)
AST: 22 U/L (ref 15–41)
Albumin: 3.4 g/dL — ABNORMAL LOW (ref 3.5–5.0)
Alkaline Phosphatase: 82 U/L (ref 38–126)
Anion gap: 11 (ref 5–15)
BUN: 50 mg/dL — ABNORMAL HIGH (ref 8–23)
CO2: 16 mmol/L — ABNORMAL LOW (ref 22–32)
Calcium: 9.6 mg/dL (ref 8.9–10.3)
Chloride: 110 mmol/L (ref 98–111)
Creatinine, Ser: 2.86 mg/dL — ABNORMAL HIGH (ref 0.44–1.00)
GFR, Estimated: 16 mL/min — ABNORMAL LOW (ref >60)
Glucose, Bld: 105 mg/dL — ABNORMAL HIGH (ref 70–99)
Potassium: 4.6 mmol/L (ref 3.5–5.1)
Sodium: 137 mmol/L (ref 135–145)
Total Bilirubin: 0.9 mg/dL (ref 0.3–1.2)
Total Protein: 7.5 g/dL (ref 6.5–8.1)

## 2023-07-22 LAB — CBC WITH DIFFERENTIAL/PLATELET
Abs Immature Granulocytes: 0.01 10*3/uL (ref 0.00–0.07)
Basophils Absolute: 0 10*3/uL (ref 0.0–0.1)
Basophils Relative: 1 %
Eosinophils Absolute: 0.1 10*3/uL (ref 0.0–0.5)
Eosinophils Relative: 1 %
HCT: 36 % (ref 36.0–46.0)
Hemoglobin: 10.5 g/dL — ABNORMAL LOW (ref 12.0–15.0)
Immature Granulocytes: 0 %
Lymphocytes Relative: 14 %
Lymphs Abs: 1.2 10*3/uL (ref 0.7–4.0)
MCH: 22.6 pg — ABNORMAL LOW (ref 26.0–34.0)
MCHC: 29.2 g/dL — ABNORMAL LOW (ref 30.0–36.0)
MCV: 77.4 fL — ABNORMAL LOW (ref 80.0–100.0)
Monocytes Absolute: 0.8 10*3/uL (ref 0.1–1.0)
Monocytes Relative: 9 %
Neutro Abs: 6.6 10*3/uL (ref 1.7–7.7)
Neutrophils Relative %: 75 %
Platelets: 174 10*3/uL (ref 150–400)
RBC: 4.65 MIL/uL (ref 3.87–5.11)
RDW: 17.2 % — ABNORMAL HIGH (ref 11.5–15.5)
WBC: 8.8 10*3/uL (ref 4.0–10.5)
nRBC: 0 % (ref 0.0–0.2)

## 2023-07-22 LAB — RESPIRATORY PANEL BY PCR

## 2023-07-22 LAB — TROPONIN I (HIGH SENSITIVITY)
Troponin I (High Sensitivity): 67 ng/L — ABNORMAL HIGH (ref <18)
Troponin I (High Sensitivity): 78 ng/L — ABNORMAL HIGH (ref <18)

## 2023-07-22 LAB — GLUCOSE, CAPILLARY: Glucose-Capillary: 151 mg/dL — ABNORMAL HIGH (ref 70–99)

## 2023-07-22 LAB — SARS CORONAVIRUS 2 BY RT PCR: SARS Coronavirus 2 by RT PCR: NEGATIVE

## 2023-07-22 LAB — BRAIN NATRIURETIC PEPTIDE: B Natriuretic Peptide: 416.5 pg/mL — ABNORMAL HIGH (ref 0.0–100.0)

## 2023-07-22 MED ORDER — ATORVASTATIN CALCIUM 10 MG PO TABS
20.0000 mg | ORAL_TABLET | Freq: Every day | ORAL | Status: DC
  Administered 2023-07-22 – 2023-07-29 (×8): 20 mg via ORAL
  Filled 2023-07-22 (×8): qty 2

## 2023-07-22 MED ORDER — HEPARIN BOLUS VIA INFUSION
5000.0000 [IU] | Freq: Once | INTRAVENOUS | Status: AC
  Administered 2023-07-22: 5000 [IU] via INTRAVENOUS
  Filled 2023-07-22: qty 5000

## 2023-07-22 MED ORDER — LABETALOL HCL 200 MG PO TABS
200.0000 mg | ORAL_TABLET | Freq: Two times a day (BID) | ORAL | Status: DC
  Administered 2023-07-22 – 2023-07-30 (×16): 200 mg via ORAL
  Filled 2023-07-22 (×16): qty 1

## 2023-07-22 MED ORDER — INSULIN ASPART 100 UNIT/ML IJ SOLN: 0.0000 [IU] | Freq: Every day | INTRAMUSCULAR | Status: DC

## 2023-07-22 MED ORDER — METRONIDAZOLE 500 MG/100ML IV SOLN
500.0000 mg | Freq: Two times a day (BID) | INTRAVENOUS | Status: DC
  Administered 2023-07-22 – 2023-07-23 (×2): 500 mg via INTRAVENOUS
  Filled 2023-07-22 (×3): qty 100

## 2023-07-22 MED ORDER — LABETALOL HCL 200 MG PO TABS: 100.0000 mg | ORAL_TABLET | Freq: Two times a day (BID) | ORAL | Status: DC

## 2023-07-22 MED ORDER — ASPIRIN 81 MG PO TBEC
81.0000 mg | DELAYED_RELEASE_TABLET | Freq: Every day | ORAL | Status: DC
  Administered 2023-07-23 – 2023-07-25 (×3): 81 mg via ORAL
  Filled 2023-07-22 (×3): qty 1

## 2023-07-22 MED ORDER — HYDRALAZINE HCL 20 MG/ML IJ SOLN
5.0000 mg | Freq: Four times a day (QID) | INTRAMUSCULAR | Status: DC | PRN
  Administered 2023-07-26 – 2023-07-29 (×3): 5 mg via INTRAVENOUS
  Filled 2023-07-22 (×3): qty 1

## 2023-07-22 MED ORDER — SODIUM CHLORIDE 0.9 % IV BOLUS
250.0000 mL | Freq: Once | INTRAVENOUS | Status: AC
  Administered 2023-07-22: 250 mL via INTRAVENOUS

## 2023-07-22 MED ORDER — VITAMIN D 25 MCG (1000 UNIT) PO TABS
2000.0000 [IU] | ORAL_TABLET | Freq: Every day | ORAL | Status: DC
  Administered 2023-07-22 – 2023-07-30 (×9): 2000 [IU] via ORAL
  Filled 2023-07-22 (×10): qty 2

## 2023-07-22 MED ORDER — SODIUM CHLORIDE 0.9 % IV SOLN
1.0000 g | Freq: Once | INTRAVENOUS | Status: AC
  Administered 2023-07-22: 1 g via INTRAVENOUS
  Filled 2023-07-22: qty 10

## 2023-07-22 MED ORDER — INSULIN ASPART 100 UNIT/ML IJ SOLN
0.0000 [IU] | Freq: Three times a day (TID) | INTRAMUSCULAR | Status: DC
  Administered 2023-07-23 – 2023-07-25 (×2): 1 [IU] via SUBCUTANEOUS
  Administered 2023-07-26: 2 [IU] via SUBCUTANEOUS

## 2023-07-22 MED ORDER — SODIUM CHLORIDE 0.9 % IV SOLN
1.0000 g | INTRAVENOUS | Status: AC
  Administered 2023-07-23 – 2023-07-26 (×4): 1 g via INTRAVENOUS
  Filled 2023-07-22 (×4): qty 10

## 2023-07-22 MED ORDER — SODIUM CHLORIDE 0.9 % IV SOLN: INTRAVENOUS | Status: DC

## 2023-07-22 MED ORDER — HEPARIN (PORCINE) 25000 UT/250ML-% IV SOLN
1150.0000 [IU]/h | INTRAVENOUS | Status: DC
  Administered 2023-07-22: 1300 [IU]/h via INTRAVENOUS
  Administered 2023-07-23 – 2023-07-25 (×3): 1150 [IU]/h via INTRAVENOUS
  Filled 2023-07-22 (×4): qty 250

## 2023-07-22 MED ORDER — ASPIRIN 81 MG PO CHEW
324.0000 mg | CHEWABLE_TABLET | Freq: Once | ORAL | Status: AC
  Administered 2023-07-22: 324 mg via ORAL
  Filled 2023-07-22: qty 4

## 2023-07-22 MED ORDER — SODIUM BICARBONATE 650 MG PO TABS
650.0000 mg | ORAL_TABLET | Freq: Three times a day (TID) | ORAL | Status: DC
  Administered 2023-07-22 – 2023-07-24 (×5): 650 mg via ORAL
  Filled 2023-07-22 (×5): qty 1

## 2023-07-22 MED ORDER — SODIUM CHLORIDE 0.9 % IV SOLN
100.0000 mg | Freq: Two times a day (BID) | INTRAVENOUS | Status: AC
  Administered 2023-07-22 – 2023-07-27 (×10): 100 mg via INTRAVENOUS
  Filled 2023-07-22 (×11): qty 100

## 2023-07-22 NOTE — Assessment & Plan Note (Signed)
Shortness of breath Likely multifactorial from probable pulmonary embolism and pneumonia, working up the significance of the elevated brain natruretic peptide as a biomarker versus also superimposed volume overload we will wait for further investigation prior to any diuresis given solitary kidney as well as nephrology consultants opinion. At the time of the patient was examined she was saturating 94% on room air there is no evidence of acute respiratory failure

## 2023-07-22 NOTE — Assessment & Plan Note (Signed)
Essential hypertension Systolic blood pressures ranging from 140-190s Complicated by her renal disease Patient self discontinue amlodipine 5 mg as outpatient Will continue her home amlodipine 100 mg twice daily Add as needed hydralazine IV 5 mg for systolic blood pressure greater than 160 mmHg

## 2023-07-22 NOTE — ED Triage Notes (Addendum)
Patient states she began having SHOB about 2 weeks ago that began shortly after beginning labetalol. Patient reports having some congestion.   Patient denies hx of Asthma, or other respiratory issues. Patient also denies chest pain.  Provider and niece at bedside.

## 2023-07-22 NOTE — Progress Notes (Signed)
VASCULAR LAB    Bilateral lower extremity venous duplex has been performed.  See CV proc for preliminary results.  Messaged results to [employer]. PA-C  Undra Harriman, RVT 07/22/2023, 4:19 PM

## 2023-07-22 NOTE — Assessment & Plan Note (Addendum)
    Type 2 diabetes mellitus Holding for fargixa given aki Sliding scale insulin as inpatient

## 2023-07-22 NOTE — Assessment & Plan Note (Addendum)
Probable pulmonary embolism Patient has pulmonary artery enlargement on her CT noncontrasted of her thorax making the question of pulmonary hypertension or acute cor pulmonale, elevated brain atretic peptide at 416 as well as elevated troponin at 67 and 78 in the presence of a deep venous thrombosis We will proceed with a VQ scan as ordered by the emergency department hwe will also obtain an echocardiogram to rule out cardiac component or any RV strain. Continue IV heparin drip with therapeutic protocol

## 2023-07-22 NOTE — Assessment & Plan Note (Signed)
 Hyperlipidemia Continue atorvastatin 20 mg daily

## 2023-07-22 NOTE — Assessment & Plan Note (Signed)
Acute DVT of the right lower extremity In the setting of reduced mobility due to constitutional symptoms ultrasound pulmonary report reporting DVT of the right posterior tibial and peroneal veins VQ scan is pending Will continue IV heparin which was started in the ED likely can transition to a p.o. agent as close interval

## 2023-07-22 NOTE — Consult Note (Signed)
Renal Service Consult Note [facility]  Carling Olshefski 07/22/2023 Maree Krabbe, MD Requesting Physician: Dr. Dorna Mai  Reason for Consult: Renal failure HPI: The patient is a 82 y.o. year-old w/ PMH as below who presented to ED w/ SOB and cough for 3 wks. SpO2 in ED was 88%, placed on 4L [place]. VSS.  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 No fevers or chills.US of the legs was done in ED which showed a RLE DVT and pt was started on IV heparin. Suspected acute PE as well per reading of the noncontrasted chest CT. Pt rec'd IV rocephin and doxycycline in ED, and IV heparin. Creatinine here is 2.86 today. Last creat 1.85 from may 2024. We are asked to see for renal failure.   Pt seen in room. Pt is visibly SOB after getting back to the bed from the BR. Pt denies any change in UOP. No flank pain. Her po intake has not been good for the last 2-3 wks at home, just "no appetite".   ROS - denies CP, no joint pain, no HA, no blurry vision, no rash, no diarrhea, no nausea/ vomiting, no dysuria, no difficulty voiding   Past Medical History  Past Medical History:  Diagnosis Date   CKD (chronic kidney disease), stage III (HCC)    Diabetes mellitus without complication (HCC)    Type 2   Family history of adverse reaction to anesthesia    cousin didn't know he had sleep apnea and was on a ventilator for a month after   Hypertension    Sleep apnea    grandson has sleep apnea pt does not   Past Surgical History  Past Surgical History:  Procedure Laterality Date   CLOSED REDUCTION SHOULDER DISLOCATION     had conscious sedation   EYE SURGERY Bilateral    cataract surgery with lens implant   MULTIPLE TOOTH EXTRACTIONS     ROBOT ASSISTED LAPAROSCOPIC NEPHRECTOMY Right 03/29/2023   Procedure: XI ROBOTIC ASSISTED LAPAROSCOPIC NEPHRECTOMY;  Surgeon: Loletta Parish., MD;  Location: [facility];  Service: Urology;  Laterality: Right;  3 HRS   TOTAL SHOULDER  ARTHROPLASTY Right 01/19/2016   Procedure: REVERSE TOTAL SHOULDER ARTHROPLASTY;  Surgeon: Cammy Copa, MD;  Location: [facility];  Service: Orthopedics;  Laterality: Right;  REVERSE TOTAL SHOULDER ARTHROPLASTY   Family History  Family History  Problem Relation Age of Onset   Stroke Mother    Diabetes Mother    Diabetes Father    Heart attack Father    Social History  reports that she quit smoking about 32 years ago. Her smoking use included cigarettes. She has never used smokeless tobacco. She reports current alcohol use. She reports that she does not use drugs. Allergies  Allergies  Allergen Reactions   Simvastatin Cough   Home medications Prior to Admission medications   Medication Sig Start Date End Date Taking? Authorizing Provider  atorvastatin (LIPITOR) 20 MG tablet Take 20 mg by mouth at bedtime. 10/26/15  Yes [provider]  cholecalciferol (VITAMIN D3) 25 MCG (1000 UNIT) tablet Take 2,000 Units by mouth daily.   Yes [provider]  labetalol (NORMODYNE) 100 MG tablet Take 100 mg by mouth 2 (two) times daily. 06/28/23  Yes [provider]     Vitals:   07/22/23 1452 07/22/23 1515 07/22/23 1720 07/22/23 1924  BP:  (!) 186/71 (!) 158/83 (!) 155/80  Pulse:  79 69 81  Resp:  20 (!) 25 20  Temp: (!) 97 F (36.1 C)   97.9 F (36.6 C)  TempSrc:    Oral  SpO2:   94% 91%  Weight:    82.8 kg  Height:    5\' 2"  (1.575 m)   Exam Gen alert, no distress, ^wob when moving around the bed No rash, cyanosis or gangrene Sclera anicteric, throat clear  No jvd or bruits Chest clear bilat to bases, no rales/ wheezing RRR no RG Abd soft ntnd no mass or ascites +bs GU defer MS no joint effusions or deformity Ext no LE or UE edema, no wounds or ulcers Neuro is alert, Ox 3 , nf      Home meds include - lipitor, vit D3, labetalol 100 bid     Date   Creat  eGFR   feb 2017  1.62  35 ml/min   sept 2021  1.41  36 ml/min (CE)   sept 2022  1.76  28 ml/min  (CE)   03/16/23  1.50  35 ml/min   03/30/23  1.85  R radical nephrectomy done 03/29/23   07/22/23  2.86  16 ml/min      Renal US nov 2023 - 10- 11 cm kidneys, R upper pole w/ mass 5-6 cm     Renal US 07/22/23 - R kidney absent; L kidney 10.2 cm, normal echo, no hydro     CT chest noncon --> IMPRESSION: 1. Heterogeneous and consolidative airspace disease throughout the left upper lobe, consistent with pneumonia. Recommend follow-up to resolution to exclude underlying neoplasm. 2. No meaningful evaluation for pulmonary embolism on this noncontrast examination. 3. Enlargement of the main pulmonary artery, as can be seen in pulmonary hypertension. 4. Coronary artery disease.     CXR - IMPRESSION: Left upper lung consolidative opacity. Possible pneumonia     Na 137  K 4.6  CO2 16  BUN 50  creat 2.86   alb 3.4  LFT's ok     WBC 8K  Hb 10.5  plt 174     VS today --> BP 158/83, HR 69,  RR 18, temp 97     Room air  95%        UA pending          UNa, UCr pending   Assessment/ Plan: AKI on CKD 3b - b/l creatinine 1.50 from April 2024, 2 wks prior to R nephrectomy, eGFR 35 ml/min. The day after the nephrectomy her creat was 1.8 then she was dc'd home. Next creatinine we have is 2.86 here in the setting of SOB, RLE DVT and possible acute PE. Poor appetite for last 2 wks per patient. Renal US here shows no obstruction of the solitary L kidney. UA pending. BP's wnl. No vol overload on exam. Creat 2.8 seems a bit higher than would expect after nephrectomy in May w/ pre-nephrectomy creatinine of 1.5. AKI may be related to poor po intake and volume depletion. Or creat 2.8 could be her new baseline. No medications implicated, no contrast, etc.  Will start gentle IVF"s w/ 250 cc bolus and 75 cc/hr. F/u labs in am.     Acute DVT RLE - w/ possible PE on non-contrast CT. IV heparin started.  HTN - BPs a bit high, will ^ labetalol 200 bid.  PNA - left upper lobe by CXR/ CT       Vinson Moselle  MD [facility] 07/22/2023, 8:19  PM  Recent Labs  Lab 07/22/23 1108  HGB 10.5*  ALBUMIN 3.4*  CALCIUM 9.6  CREATININE 2.86*  K 4.6   Inpatient medications:  [START ON 07/23/2023] aspirin EC  81 mg Oral Daily   atorvastatin  20 mg Oral QHS   cholecalciferol  2,000 Units Oral Daily   insulin aspart  0-5 Units Subcutaneous QHS   [START ON 07/23/2023] insulin aspart  0-9 Units Subcutaneous TID WC   labetalol  100 mg Oral BID   sodium bicarbonate  650 mg Oral TID    [START ON 07/23/2023] cefTRIAXone (ROCEPHIN)  IV     doxycycline (VIBRAMYCIN) IV 100 mg (07/22/23 1719)   heparin 1,300 Units/hr (07/22/23 1712)   metronidazole     hydrALAZINE

## 2023-07-22 NOTE — H&P (Addendum)
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  The patient's history is helped with her niece [name] and at the bedside who reports that she has also developed a cough and malaise but no fever or chills.  The patient denies any orthopnea or paroxysmal nocturnal dyspnea.  Of note the patient was recently placed on amlodipine by her urologist and discontinued that medication because she believed it was contributing to her dyspnea.  The patient was noted to have DVT ultrasound in the emergency department in the right lower extremity and was started on IV heparin.  The patient received IV ceftriaxone and doxycycline in the emergency department.  Case discussed via telephone with nephrologist as well as ED provider.  Review of Systems:  Constitutional: Denies Weight Loss, Fever, Chills or Night Sweats but reports malaise Respiratory: Reports Shortness of Breath, Cough but denies Hemoptysis, Wheezing, Pleurisy Cardiovascular: Denies Chest Pain, Paroxsymal Nocturnal Dyspnea, Palpitations, Edema All other systems were reviewed and are negative   Past Medical History:  Diagnosis Date   CKD (chronic kidney disease), stage III (HCC)    Diabetes mellitus without complication (HCC)    Type 2   Family history of adverse reaction to anesthesia    cousin didn't know he had sleep apnea and was on a ventilator for a month after   Hypertension    Sleep apnea    grandson has  sleep apnea pt does not   Past Surgical History:  Procedure Laterality Date   CLOSED REDUCTION SHOULDER DISLOCATION     had conscious sedation   EYE SURGERY Bilateral    cataract surgery with lens implant   MULTIPLE TOOTH EXTRACTIONS     ROBOT ASSISTED LAPAROSCOPIC NEPHRECTOMY Right 03/29/2023   Procedure: XI ROBOTIC ASSISTED LAPAROSCOPIC NEPHRECTOMY;  Surgeon: Loletta Parish., MD;  Location: [facility];  Service: Urology;  Laterality: Right;  3 HRS   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   Procedure: REVERSE TOTAL SHOULDER ARTHROPLASTY;  Surgeon: Cammy Copa, MD;  Location: [facility];  Service: Orthopedics;  Laterality: Right;  REVERSE TOTAL SHOULDER ARTHROPLASTY   Social History:  reports that she quit smoking about 32 years ago. Her smoking use included cigarettes. She has never used smokeless tobacco. She reports current alcohol use. She reports that she does not use drugs.  Allergies  Allergen Reactions   Simvastatin Cough    Family History  Problem Relation Age of Onset   Stroke Mother    Diabetes Mother    Diabetes Father    Heart attack Father     Prior to Admission medications   Medication Sig Start Date End Date Taking? Authorizing Provider  atorvastatin (LIPITOR) 20 MG tablet Take 20 mg by mouth at bedtime. 10/26/15  Yes [provider]  cholecalciferol (VITAMIN D3) 25 MCG (1000 UNIT) tablet Take 2,000 Units by mouth daily.   Yes [provider]  labetalol (NORMODYNE) 100 MG tablet Take 100 mg by mouth 2 (two) times daily. 06/28/23  Yes  [provider]    Physical Exam: Vitals:   07/22/23 1330 07/22/23 1452 07/22/23 1515 07/22/23 1720  BP: (!) 167/85  (!) 186/71 (!) 158/83  Pulse: 66  79 69  Resp: 17  20 (!) 25  Temp:  (!) 97 F (36.1 C)    SpO2: 95%   94%  Patient seen ~5:15 Room 3 with Niece at Bedside Constitutional:  Vital Signs as per [facility] than three noted] No Acute Distress Eyes:  Pink Conjunctiva and no  Ptosis Neck:     Trachea Midline, Neck Symmetric Respiratory:   Respiratory Effort Normal: No Use of Respiratory Muscles,No  Intercostal Retractions             Lungs with coarse crackles (R) Lobe to Auscultation  Cardiovascular:   Heart Auscultated: Regular Regular without any added sounds or murmurs               Extremity Edema R>L Gastrointestinal:  Abdomen soft and nontender without palpable masses, guarding or rebound  No Palpable Splenomegaly or Hepatomegaly Psychiatric:  Patient with appropriate mood and affect  Data Reviewed: ECG 12-lead independently reviewed and interpreted patient has a ventricular rate in the high 60s QTc of 460 without ST deviations Labs and pending as per A/P  Assessment and Plan: * DVT (deep venous thrombosis) (HCC) Acute DVT of the right lower extremity In the setting of reduced mobility due to constitutional symptoms ultrasound pulmonary report reporting DVT of the right posterior tibial and peroneal veins VQ scan is pending Will continue IV heparin which was started in the ED likely can transition to a p.o. agent as close interval  Acute pulmonary embolism (HCC) Probable pulmonary embolism Patient has pulmonary artery enlargement on her CT noncontrasted of her thorax making the question of pulmonary hypertension or acute cor pulmonale, elevated brain atretic peptide at 416 as well as elevated troponin at 67 and 78 in the presence of a deep venous thrombosis We will proceed with a VQ scan as ordered by the emergency department hwe will also obtain an echocardiogram to rule out cardiac component or any RV strain. Continue IV heparin drip with therapeutic protocol   Shortness of breath Shortness of breath Likely multifactorial from probable pulmonary embolism and pneumonia, working up the significance of the elevated brain natruretic peptide as a biomarker versus also superimposed volume overload we will wait for further investigation prior to any  diuresis given solitary kidney as well as nephrology consultants opinion. At the time of the patient was examined she was saturating 94% on room air there is no evidence of acute respiratory failure   AKI (acute kidney injury) (HCC) AKI on CKD status post nephrectomy Her immediate postoperative creatinine appears to be 1.85 from 03/30/2023, up to 2.86 today, not exhibiting any uremic symptoms holding her SGLT2 obtaining bladder scans as well as renal ultrasound to exclude an obstructive process does not appear volume depleted in any way Consulting nephrology on recommended next interventions, unclear how much is new baseline post-op vs. True AKI  Hyperlipidemia Hyperlipidemia Continue atorvastatin 20 mg daily   Type 2 diabetes mellitus with chronic kidney disease, without long-term current use of insulin (HCC)    Type 2 diabetes mellitus Holding for fargixa given aki Sliding scale insulin as inpatient    CAP (community acquired pneumonia) Community-acquired pneumonia, possibly postobstructive We will continue ceftriaxone doxycycline as well as the addition of Flagyl to cover for anaerobes given the possibility of postobstructive pneumonia as per the radiologist concern  CT: 1. Heterogeneous and consolidative airspace disease throughout theleft upper lobe, consistent with pneumonia. Recommend follow-up toresolution to exclude underlying neoplasm. Obtaining respiratory viral panel given the patient's constitutively symptoms but lower suspicion Patient will need interval imaging once pneumonia has been treated to rule out underlying mass or malignancy  Metabolic acidosis Metabolic acidosis CO2 is 16 this is likely secondary to the patient's CKD and AKI superimposed We will start oral bicarb   Essential hypertension Essential hypertension Systolic blood pressures ranging from 140-190s Complicated by her renal disease Patient self discontinue amlodipine 5 mg as outpatient Will  continue her home amlodipine 100 mg twice daily Add as needed hydralazine IV 5 mg for systolic blood pressure greater than 160 mmHg   Non-ischemic myocardial injury (non-traumatic) Nontraumatic nonischemic myocardial injury ECG 12-lead independently reviewed and interpreted patient has a ventricular rate in the high 60s QTc of 460 without ST deviations Troponin 67 and 78 respectively likely secondary to ventricular wall stress from pulmonary emboli Obtaining echocardiogram to rule out any new wall motion abnormalities of note the patient is already on heparin for the PE indication, ASA added     Advance Care Planning:   Code Status: Full Code   Consults: Nephrology  Family Communication: Niece at the Bedside   Severity of Illness: The appropriate patient status for this patient is INPATIENT. Inpatient status is judged to be reasonable and necessary in order to provide the required intensity of service to ensure the patient's safety. The patient's presenting symptoms, physical exam findings, and initial radiographic and laboratory data in the context of their chronic comorbidities is felt to place them at high risk for further clinical deterioration. Furthermore, it is not anticipated that the patient will be medically stable for discharge from the hospital within 2 midnights of admission.   * I certify that at the point of admission it is my clinical judgment that the patient will require inpatient hospital care spanning beyond 2 midnights from the point of admission due to high intensity of service, high risk for further deterioration and high frequency of surveillance required.*  Patient was placed into inpatient status given her new acute kidney injury complicated by solitary kidney which complicates her presentation with DVT and probable pulmonary embolism with pneumonia.  7:53 PM Respiratory panel negative, can discontinue precautions (ordered)  Author: Princess Bruins, MD 07/22/2023  6:34 PM  For on call review [email or web address].

## 2023-07-22 NOTE — ED Notes (Addendum)
Patient left facility via EMS with niece.   Patient is being discharged from the Urgent Care and sent to the Emergency Department via EMS. Per Laren Everts FNP, patient is in need of higher level of care due to need for further evaluation. Patient is aware and verbalizes understanding of plan of care.  Vitals:   07/22/23 0946 07/22/23 0949  BP:  (!) 147/93  Pulse:  71  Resp:  18  Temp:  98.2 F (36.8 C)  SpO2: 96% 94%

## 2023-07-22 NOTE — Assessment & Plan Note (Signed)
Metabolic acidosis CO2 is 16 this is likely secondary to the patient's CKD and AKI superimposed We will start oral bicarb

## 2023-07-22 NOTE — Progress Notes (Signed)
ANTICOAGULATION CONSULT NOTE - Initial Consult  Pharmacy Consult for heparin Indication: chest pain/ACS and pulmonary embolus  No Known Allergies  Patient Measurements:   Heparin Dosing Weight: 78kg  Vital Signs: Temp: 97 F (36.1 C) (08/24 1452) Temp Source: Oral (08/24 0949) BP: 167/85 (08/24 1330) Pulse Rate: 66 (08/24 1330)  Labs: Recent Labs    07/22/23 1108 07/22/23 1305  HGB 10.5*  --   HCT 36.0  --   PLT 174  --   CREATININE 2.86*  --   TROPONINIHS 67* 78*    CrCl cannot be calculated (Unknown ideal weight.).   Medical History: Past Medical History:  Diagnosis Date   CKD (chronic kidney disease), stage III (HCC)    Diabetes mellitus without complication (HCC)    Type 2   Family history of adverse reaction to anesthesia    cousin didn't know he had sleep apnea and was on a ventilator for a month after   Hypertension    Sleep apnea    grandson has sleep apnea pt does not    Assessment: 76 YOF presenting with SOB, elevated troponin, also awaiting VQ scan for PE rule and and pharmacy consulted to start heparin gtt.  She is not on anticoagulation PTA  Goal of Therapy:  Heparin level 0.3-0.7 units/ml Monitor platelets by anticoagulation protocol: Yes   Plan:  Heparin 5000 units IV x 1, and gtt at 1300 units/hr F/u 8 hour heparin level F/u VQ scan and long term [facility] plan  Daylene Posey, PharmD, [facility] Clinical Pharmacist ED Pharmacist Phone # [phone] 07/22/2023 3:32 PM

## 2023-07-22 NOTE — Assessment & Plan Note (Addendum)
Nontraumatic nonischemic myocardial injury ECG 12-lead independently reviewed and interpreted patient has a ventricular rate in the high 60s QTc of 460 without ST deviations Troponin 67 and 78 respectively likely secondary to ventricular wall stress from pulmonary emboli Obtaining echocardiogram to rule out any new wall motion abnormalities of note the patient is already on heparin for the PE indication, ASA added

## 2023-07-22 NOTE — ED Provider Notes (Signed)
[facility]    CSN: [id number] Arrival date & time: 07/22/23  7829      History   Chief Complaint Chief Complaint  Patient presents with   Shortness of Breath    HPI Dana Fleming is a 82 y.o. female.   Patient presents with her niece who helped provide history.  Patient reports that shortness of breath started about 3 weeks ago but is worsened over the past week.  Denies chest pain, headache, dizziness, blurred vision, nausea, vomiting.  Patient denies history of asthma or COPD and does not smoke cigarettes.  Reports that she has had a cough for the past few days but cough started after shortness of breath.  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    Past Surgical History:  Procedure Laterality Date   CLOSED REDUCTION SHOULDER DISLOCATION     had conscious sedation   EYE SURGERY Bilateral    cataract surgery with lens implant   MULTIPLE TOOTH EXTRACTIONS     ROBOT ASSISTED LAPAROSCOPIC NEPHRECTOMY Right 03/29/2023   Procedure: XI ROBOTIC ASSISTED LAPAROSCOPIC NEPHRECTOMY;  Surgeon: Loletta Parish., MD;  Location: [facility];  Service: Urology;  Laterality: Right;  3 HRS   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   Procedure: REVERSE TOTAL SHOULDER ARTHROPLASTY;  Surgeon: Cammy Copa, MD;  Location: [facility];  Service: Orthopedics;  Laterality: Right;  REVERSE TOTAL SHOULDER ARTHROPLASTY    OB History    No obstetric history on file.      Home Medications    Prior to Admission medications   Medication Sig Start Date End Date Taking? Authorizing Provider  labetalol (NORMODYNE) 100 MG tablet 1 tablet Orally Twice a day for 30 days 06/28/23  Yes [provider]  amLODipine (NORVASC) 5 MG tablet Take 5 mg by mouth daily. 09/21/15   [provider]  atorvastatin (LIPITOR) 20 MG tablet Take 20 mg by mouth at bedtime. 10/26/15   [provider]  docusate sodium (COLACE) 100 MG capsule Take 1 capsule (100 mg total) by mouth 2 (two) times daily. 03/29/23   Harrie Foreman, PA-C  ergocalciferol (VITAMIN D2) 1.25 MG (50000 UT) capsule Take 50,000 Units by mouth once a week. Sundays    [provider]  FARXIGA 5 MG TABS tablet Take 5 mg by mouth daily.    [provider]  furosemide (LASIX) 20 MG tablet Take 20 mg by mouth daily. 01/01/23   [provider]  Ginger, Zingiber officinalis, 550 MG CAPS Take 550 mg by mouth daily.    [provider]  HYDROcodone-acetaminophen (NORCO) 5-325 MG tablet Take 1-2 tablets by mouth every 6 (six) hours as needed for moderate pain or severe pain. 03/29/23   Harrie Foreman, PA-C  olmesartan (BENICAR) 20 MG tablet Take 20 mg by mouth at bedtime. 12/31/22   [provider]  TART CHERRY PO Take 1,000 mg by mouth at bedtime.    [provider]    Family History Family History  Problem Relation Age of Onset   Stroke Mother    Diabetes Mother    Diabetes Father    Heart attack Father     Social History Social History   Tobacco Use   Smoking status: Former    Current packs/day: 0.00    Types: Cigarettes    Quit date: 01/13/1991    Years since quitting: 32.5   Smokeless tobacco: Never  Vaping Use   Vaping status: Never Used  Substance Use Topics   Alcohol use: Yes    Comment: occasional   Drug use: No     Allergies   Patient has no known allergies.   Review of Systems Review of  Systems Per HPI  Physical Exam Triage Vital Signs ED Triage Vitals  Encounter Vitals Group     BP 07/22/23 0949 (!) 147/93     Systolic BP Percentile --      Diastolic BP Percentile --      Pulse Rate 07/22/23 0949 71     Resp 07/22/23 0949 18     Temp --      Temp src --      SpO2 07/22/23 0941 (!) 87 %     Weight --      Height --      Head Circumference --      Peak Flow --      Pain Score 07/22/23 0945 0     Pain Loc --      Pain Education --      Exclude from Growth Chart --    No data found.  Updated Vital Signs BP (!) 147/93 (BP Location: Left Arm)   Pulse 71   Temp 98.2 F (36.8 C) (Oral)   Resp 18   SpO2 94%   Visual Acuity Right Eye Distance:   Left Eye Distance:   Bilateral Distance:    Right Eye Near:   Left Eye Near:    Bilateral Near:     Physical Exam Constitutional:      General: She is not in acute distress.    Appearance: Normal appearance. She is not toxic-appearing or diaphoretic.  HENT:     Head: Normocephalic and atraumatic.  Eyes:     Extraocular Movements: Extraocular movements intact.     Conjunctiva/sclera: Conjunctivae normal.  Cardiovascular:     Rate and Rhythm: Normal rate and regular rhythm.     Pulses: Normal pulses.     Heart sounds: Normal heart sounds.  Pulmonary:     Effort: No respiratory distress.     Breath sounds: Normal breath sounds. No stridor. No wheezing, rhonchi or rales.     Comments: Mild tachypnea noted.  Neurological:     General: No focal deficit present.     Mental Status: She is alert and oriented to person, place, and time. Mental status is at baseline.  Psychiatric:        Mood and Affect: Mood normal.        Behavior: Behavior normal.        Thought Content: Thought content normal.        Judgment: Judgment normal.      UC Treatments / Results  Labs (all labs ordered are listed, but only abnormal results are displayed) Labs Reviewed - No data to display  EKG   Radiology No  results  found.  Procedures Procedures (including critical care time)  Medications Ordered in UC Medications - No data to display  Initial Impression / Assessment and Plan / UC Course  I have reviewed the triage vital signs and the nursing notes.  Pertinent labs & imaging results that were available during my care of the patient were reviewed by me and considered in my medical decision making (see chart for details).     Patient's oxygen on initial triage was 86% and patient is mildly tachypneic today.  Patient was placed on 2 L of oxygen with recovery of oxygen to about 94 to 95%. EKG NSR. Given patient is tachypneic and oxygen is low, recommended to patient that she go to the hospital via EMS transport for further evaluation and management.  She was agreeable with this plan.  Patient left via EMS transport. Final Clinical Impressions(s) / UC Diagnoses   Final diagnoses:  Shortness of breath  Low oxygen saturation   Discharge Instructions   None    ED Prescriptions   None    PDMP not reviewed this encounter.   Gustavus Bryant, [place] 07/22/23 1024

## 2023-07-22 NOTE — Assessment & Plan Note (Addendum)
AKI on CKD status post nephrectomy Her immediate postoperative creatinine appears to be 1.85 from 03/30/2023, up to 2.86 today, not exhibiting any uremic symptoms holding her SGLT2 obtaining bladder scans as well as renal ultrasound to exclude an obstructive process does not appear volume depleted in any way Consulting nephrology on recommended next interventions, unclear how much is new baseline post-op vs. True AKI

## 2023-07-22 NOTE — ED Triage Notes (Addendum)
Patient BIB EMS from UC with SOB and cough x3wks, SPO2 88%ra placed 4liters [place]. Rhonchi in left lower lobe. 156/84, 68, 100%. CBG 109

## 2023-07-22 NOTE — ED Notes (Signed)
ED TO INPATIENT HANDOFF REPORT  ED Nurse Name and Phone #:   S Name/Age/Gender Dana Fleming 82 y.o. female Room/Bed: 003C/003C  Code Status   Code Status: Full Code  Home/SNF/Other Home Patient oriented to: self, place, time, and situation Is this baseline? Yes   Triage Complete: Triage complete  Chief Complaint DVT (deep venous thrombosis) (HCC) [I82.409]  Triage Note Patient BIB EMS from UC with SOB and cough x3wks, SPO2 88%ra placed 4liters [place]. Rhonchi in left lower lobe. 156/84, 68, 100%. CBG 109   Allergies Allergies  Allergen Reactions   Simvastatin Cough    Level of Care/Admitting Diagnosis ED Disposition     ED Disposition  Admit   Condition  --   Comment  Hospital Area: [facility] [[id number]]  Level of Care: Telemetry Medical [104]  May place patient in observation at [facility] or [facility] if equivalent level of care is available:: Yes  Covid Evaluation: Symptomatic Person Under Investigation (PUI) or recent exposure (last 10 days) *Testing Required*  Diagnosis: DVT (deep venous thrombosis) [facility]) [[id number]]  Admitting Physician: Princess Bruins [[id number]]  Attending Physician: Princess Bruins [[id number]]          B Medical/Surgery History Past Medical History:  Diagnosis Date   CKD (chronic kidney disease), stage III (HCC)    Diabetes mellitus without complication (HCC)    Type 2   Family history of adverse reaction to anesthesia    cousin didn't know he had sleep apnea and was on a ventilator for a month after   Hypertension    Sleep apnea    grandson has sleep apnea pt does not   Past Surgical History:  Procedure Laterality Date   CLOSED REDUCTION SHOULDER DISLOCATION     had conscious sedation   EYE SURGERY Bilateral    cataract surgery with lens implant   MULTIPLE TOOTH EXTRACTIONS     ROBOT ASSISTED LAPAROSCOPIC NEPHRECTOMY Right 03/29/2023   Procedure: XI ROBOTIC ASSISTED LAPAROSCOPIC NEPHRECTOMY;  Surgeon: Loletta Parish., MD;  Location: [facility];  Service: Urology;  Laterality: Right;  3 HRS   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   Procedure: REVERSE TOTAL SHOULDER ARTHROPLASTY;  Surgeon: Cammy Copa, MD;  Location: [facility];  Service: Orthopedics;  Laterality: Right;  REVERSE TOTAL SHOULDER ARTHROPLASTY     A IV Location/Drains/Wounds Patient Lines/Drains/Airways Status     Active Line/Drains/Airways     Name Placement date Placement time Site Days   Peripheral IV 07/22/23 20 G Anterior;Left Forearm 07/22/23  1134  Forearm  less than 1   Peripheral IV 07/22/23 20 G Distal;Posterior;Right Forearm 07/22/23  1707  Forearm  less than 1   Incision - 4 Ports Abdomen 1: Right;Lateral;Lower 2: Right;Mid 3: Right;Upper 4: Upper;Medial 03/29/23  1240  -- 115            Intake/Output Last 24 hours No intake or output data in the 24 hours ending 07/22/23 1745  Labs/Imaging Results for orders placed or performed during the hospital encounter of 07/22/23 (from the past 48 hour(s))  CBC with Differential     Status: Abnormal   Collection Time: 07/22/23 11:08 AM  Result Value Ref Range   WBC 8.8 4.0 - 10.5 K/uL   RBC 4.65 3.87 - 5.11 MIL/uL   Hemoglobin 10.5 (L) 12.0 - 15.0 g/dL   HCT 88.4 16.6 - 06.3 %   MCV 77.4 (L) 80.0 - 100.0 fL   MCH 22.6 (L) 26.0 -  34.0 pg   MCHC 29.2 (L) 30.0 - 36.0 g/dL   RDW 32.9 (H) 51.8 - 84.1 %   Platelets 174 150 - 400 K/uL   nRBC 0.0 0.0 - 0.2 %   Neutrophils Relative % 75 %   Neutro Abs 6.6 1.7 - 7.7 K/uL   Lymphocytes Relative 14 %   Lymphs Abs 1.2 0.7 - 4.0 K/uL   Monocytes Relative 9 %   Monocytes Absolute 0.8 0.1 - 1.0 K/uL   Eosinophils Relative 1 %   Eosinophils Absolute 0.1 0.0 - 0.5 K/uL   Basophils Relative 1 %   Basophils Absolute 0.0 0.0 - 0.1 K/uL   Immature Granulocytes 0 %   Abs Immature Granulocytes 0.01 0.00 - 0.07 K/uL    Comment: Performed at [facility], [address] [place], [place] [address]  Comprehensive  metabolic panel     Status: Abnormal   Collection Time: 07/22/23 11:08 AM  Result Value Ref Range   Sodium 137 135 - 145 mmol/L   Potassium 4.6 3.5 - 5.1 mmol/L   Chloride 110 98 - 111 mmol/L   CO2 16 (L) 22 - 32 mmol/L   Glucose, Bld 105 (H) 70 - 99 mg/dL    Comment: Glucose reference range applies only to samples taken after fasting for at least 8 hours.   BUN 50 (H) 8 - 23 mg/dL   Creatinine, Ser 0.16 (H) 0.44 - 1.00 mg/dL   Calcium 9.6 8.9 - 01.0 mg/dL   Total Protein 7.5 6.5 - 8.1 g/dL   Albumin 3.4 (L) 3.5 - 5.0 g/dL   AST 22 15 - 41 U/L   ALT 15 0 - 44 U/L   Alkaline Phosphatase 82 38 - 126 U/L   Total Bilirubin 0.9 0.3 - 1.2 mg/dL   GFR, Estimated 16 (L) >60 mL/min    Comment: (NOTE) Calculated using the CKD-EPI Creatinine Equation (2021)    Anion gap 11 5 - 15    Comment: Performed at [facility], [address], [place], [place] [address]  Troponin I (High Sensitivity)     Status: Abnormal   Collection Time: 07/22/23 11:08 AM  Result Value Ref Range   Troponin I (High Sensitivity) 67 (H) <18 ng/L    Comment: (NOTE) Elevated high sensitivity troponin I (hsTnI) values and significant  changes across serial measurements may suggest ACS but many other  chronic and acute conditions are known to elevate hsTnI results.  Refer to the "Links" section for chest pain algorithms and additional  guidance. Performed at [facility], [address], [place], [place] [address]   Brain natriuretic peptide     Status: Abnormal   Collection Time: 07/22/23 11:08 AM  Result Value Ref Range   B Natriuretic Peptide 416.5 (H) 0.0 - 100.0 pg/mL    Comment: Performed at [facility], [address], [place], [place] [address]  SARS Coronavirus 2 by RT PCR (hospital order, performed in [facility] hospital lab) *cepheid single result test* Anterior Nasal Swab     Status: None   Collection Time: 07/22/23 11:20 AM   Specimen: Anterior Nasal Swab  Result Value Ref Range    SARS Coronavirus 2 by RT PCR NEGATIVE NEGATIVE    Comment: Performed at [facility], [address], [place], [place] [address]  Troponin I (High Sensitivity)     Status: Abnormal   Collection Time: 07/22/23  1:05 PM  Result Value Ref Range  Troponin I (High Sensitivity) 78 (H) <18 ng/L    Comment: (NOTE) Elevated high sensitivity troponin I (hsTnI) values and significant  changes across serial measurements may suggest ACS but many other  chronic and acute conditions are known to elevate hsTnI results.  Refer to the "Links" section for chest pain algorithms and additional  guidance. Performed at [facility], [address], [place], [place] [address]    CT Chest Wo Contrast  Result Date: 07/22/2023 CLINICAL DATA:  PE suspected versus pneumonia EXAM: CT CHEST WITHOUT CONTRAST TECHNIQUE: Multidetector CT imaging of the chest was performed following the standard protocol without IV contrast. RADIATION DOSE REDUCTION: This exam was performed according to the departmental dose-optimization program which includes automated exposure control, adjustment of the mA and/or kV according to patient size and/or use of iterative reconstruction technique. COMPARISON:  None Available. FINDINGS: Cardiovascular: Aortic atherosclerosis. Normal heart size. Enlargement of the main pulmonary artery measuring up to 3.7 cm in caliber. Three-vessel coronary artery calcifications. No pericardial effusion. Mediastinum/Nodes: No enlarged mediastinal, hilar, or axillary lymph nodes. Small hiatal hernia. Thyroid gland, trachea, and esophagus demonstrate no significant findings. Lungs/Pleura: Heterogeneous and consolidative airspace disease throughout the left upper lobe (series 4, image 28). No pleural effusion or pneumothorax. Upper Abdomen: No acute abnormality. Partially imaged right nephrectomy. Musculoskeletal: No chest wall abnormality. No acute osseous findings. IMPRESSION: 1. Heterogeneous and  consolidative airspace disease throughout the left upper lobe, consistent with pneumonia. Recommend follow-up to resolution to exclude underlying neoplasm. 2. No meaningful evaluation for pulmonary embolism on this noncontrast examination. 3. Enlargement of the main pulmonary artery, as can be seen in pulmonary hypertension. 4. Coronary artery disease. Aortic Atherosclerosis (ICD10-I70.0). Electronically Signed   By: Jearld Lesch M.D.   On: 07/22/2023 16:37   VAS [place] LOWER EXTREMITY VENOUS (DVT) (ONLY [facility])  Result Date: 07/22/2023  Lower Venous DVT Study Patient Name:  V[facility]  Date of Exam:   07/22/2023 Medical Rec #: [id number]    Accession #:    [id number] Date of Birth: February 19, 1941    Patient Gender: F Patient Age:   73 years Exam Location:  [facility] Procedure:      VAS [place] LOWER EXTREMITY VENOUS (DVT) Referring Phys: Arabella Merles --------------------------------------------------------------------------------  Indications: SOB.  Comparison Study: No prior study on file Performing Technologist: Sherren Kerns RVS  Examination Guidelines: A complete evaluation includes B-mode imaging, spectral Doppler, color Doppler, and power Doppler as needed of all accessible portions of each vessel. Bilateral testing is considered an integral part of a complete examination. Limited examinations for reoccurring indications may be performed as noted. The reflux portion of the exam is performed with the patient in reverse Trendelenburg.  +---------+---------------+---------+-----------+----------+--------------+ RIGHT    CompressibilityPhasicitySpontaneityPropertiesThrombus Aging +---------+---------------+---------+-----------+----------+--------------+ CFV      Full           Yes      Yes                                 +---------+---------------+---------+-----------+----------+--------------+ SFJ      Full                                                         +---------+---------------+---------+-----------+----------+--------------+ FV Prox  Full                                                        +---------+---------------+---------+-----------+----------+--------------+  FV Mid   Full                                                        +---------+---------------+---------+-----------+----------+--------------+ FV DistalFull                                                        +---------+---------------+---------+-----------+----------+--------------+ PFV      Full                                                        +---------+---------------+---------+-----------+----------+--------------+ POP      Full           Yes      Yes                                 +---------+---------------+---------+-----------+----------+--------------+ PTV      None                                         Acute          +---------+---------------+---------+-----------+----------+--------------+ PERO     None                                         Acute          +---------+---------------+---------+-----------+----------+--------------+   +---------+---------------+---------+-----------+----------+--------------+ LEFT     CompressibilityPhasicitySpontaneityPropertiesThrombus Aging +---------+---------------+---------+-----------+----------+--------------+ CFV      Full           Yes      Yes                                 +---------+---------------+---------+-----------+----------+--------------+ SFJ      Full                                                        +---------+---------------+---------+-----------+----------+--------------+ FV Prox  Full                                                        +---------+---------------+---------+-----------+----------+--------------+ FV Mid   Full                                                         +---------+---------------+---------+-----------+----------+--------------+  FV DistalFull                                                        +---------+---------------+---------+-----------+----------+--------------+ PFV      Full                                                        +---------+---------------+---------+-----------+----------+--------------+ POP      Full           Yes      Yes                                 +---------+---------------+---------+-----------+----------+--------------+ PTV      Full                                                        +---------+---------------+---------+-----------+----------+--------------+ PERO     Full                                                        +---------+---------------+---------+-----------+----------+--------------+     Summary: BILATERAL: -No evidence of popliteal cyst, bilaterally. RIGHT: - Findings consistent with acute deep vein thrombosis involving the right posterior tibial veins, and right peroneal veins.   LEFT: - There is no evidence of deep vein thrombosis in the lower extremity.  *See table(s) above for measurements and observations.    Preliminary    DG Chest Portable 1 View  Result Date: 07/22/2023 CLINICAL DATA:  Dyspnea for 3 weeks EXAM: PORTABLE CHEST 1 VIEW COMPARISON:  None Available. FINDINGS: Consolidative left upper lobe opacity. No pneumothorax, effusion or edema. Normal cardiopericardial silhouette. Calcified aorta. Right shoulder reverse arthroplasty. Overlapping cardiac leads. IMPRESSION: Left upper lung consolidative opacity. Possible pneumonia. However recommend follow-up to confirm resolution. Electronically Signed   By: Karen Kays M.D.   On: 07/22/2023 12:49    Pending Labs Unresulted Labs (From admission, onward)     Start     Ordered   07/24/23 0500  Heparin level (unfractionated)  Daily,   R     Placed in "And" Linked Group   07/22/23 1534   07/23/23 0500  CBC   Daily,   R     Placed in "And" Linked Group   07/22/23 1534   07/23/23 0500  Comprehensive metabolic panel  Tomorrow morning,   R        07/22/23 1742   07/23/23 0100  Heparin level (unfractionated)  Once-Timed,   URGENT        07/22/23 1534            Vitals/Pain Today's Vitals   07/22/23 1330 07/22/23 1452 07/22/23 1515 07/22/23 1720  BP: (!) 167/85  (!) 186/71 (!) 158/83  Pulse: 66  79 69  Resp: 17  20 (!) 25  Temp:  (!) 97 F (36.1 C)    SpO2: 95%   94%    Isolation Precautions No active isolations  Medications Medications  heparin ADULT infusion 100 units/mL (25000 units/252mL) (1,300 Units/hr Intravenous New Bag/Given 07/22/23 1712)  doxycycline (VIBRAMYCIN) 100 mg in sodium chloride 0.9 % 250 mL IVPB (100 mg Intravenous New Bag/Given 07/22/23 1719)  aspirin EC tablet 81 mg (has no administration in time range)  aspirin chewable tablet 324 mg (324 mg Oral Given 07/22/23 1440)  heparin bolus via infusion 5,000 Units (5,000 Units Intravenous Bolus from Bag 07/22/23 1712)  cefTRIAXone (ROCEPHIN) 1 g in sodium chloride 0.9 % 100 mL IVPB (0 g Intravenous Stopped 07/22/23 1740)    Mobility walks     Focused Assessments Pulmonary Assessment Handoff:  Lung sounds:   O2 Device: Room Air      R Recommendations: See Admitting Provider Note  Report given to:   Additional Notes:

## 2023-07-22 NOTE — ED Notes (Signed)
Patient transferred to CT then vascular

## 2023-07-22 NOTE — Assessment & Plan Note (Signed)
Community-acquired pneumonia, possibly postobstructive We will continue ceftriaxone doxycycline as well as the addition of Flagyl to cover for anaerobes given the possibility of postobstructive pneumonia as per the radiologist concern  CT: 1. Heterogeneous and consolidative airspace disease throughout theleft upper lobe, consistent with pneumonia. Recommend follow-up toresolution to exclude underlying neoplasm. Obtaining respiratory viral panel given the patient's constitutively symptoms but lower suspicion Patient will need interval imaging once pneumonia has been treated to rule out underlying mass or malignancy

## 2023-07-22 NOTE — ED Provider Notes (Signed)
[place] EMERGENCY DEPARTMENT AT [facility] Provider Note   CSN: [id number] Arrival date & time: 07/22/23  1100     History  Chief Complaint  Patient presents with   Shortness of Breath    Dana Fleming is a 82 y.o. female with history of CKD, diabetes, hypertension who presents with concern for 3 weeks of shortness of breath.  She states 3 weeks ago her dose of amlodipine was lowered and started on labetalol.  She self discontinued her amlodipine.  Since then, she has been having shortness of breath when doing her daily activities.  Normally she is very active and does not have activity limitations, but she notes now just going from the bed to the bathroom she will get short of breath.  Denies any chest pain, arm pain, nausea or vomiting, fever or chills.  She notes a slight dry cough for the past week.  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  Denies any known lung diseases or cardiac conditions besides hypertension.   Shortness of Breath      Home Medications Prior to Admission medications   Medication Sig Start Date End Date Taking? Authorizing Provider  amLODipine (NORVASC) 5 MG tablet Take 5 mg by mouth daily. 09/21/15  Yes [provider]  atorvastatin (LIPITOR) 20 MG tablet Take 20 mg by mouth at bedtime. 10/26/15  Yes [provider]  cholecalciferol (VITAMIN D3) 25 MCG (1000 UNIT) tablet Take 2,000 Units by mouth daily.   Yes [provider]  FARXIGA 5 MG TABS tablet Take 5 mg by mouth daily.   Yes [provider]  Ginger, Zingiber officinalis, 550 MG CAPS Take 550 mg by mouth daily.   Yes [provider]  labetalol (NORMODYNE) 100 MG tablet Take 100 mg by mouth 2 (two) times daily. 06/28/23  Yes [provider]  TART CHERRY PO Take 1,000 mg by mouth at bedtime.   Yes [provider]      Allergies    Simvastatin    Review of Systems   Review  of Systems  Respiratory:  Positive for shortness of breath.     Physical Exam Updated Vital Signs BP (!) 158/83   Pulse 69   Temp (!) 97 F (36.1 C)   Resp (!) 25   SpO2 94%  Physical Exam Vitals and nursing note reviewed.  Constitutional:      General: She is not in acute distress.    Appearance: She is well-developed.     Comments: Well-appearing and talking in somewhat full sentences, slightly gathering breath in between sentences.   HENT:     Head: Normocephalic and atraumatic.  Eyes:     Conjunctiva/sclera: Conjunctivae normal.  Cardiovascular:     Rate and Rhythm: Normal rate and regular rhythm.     Heart sounds: No murmur heard.    Comments: Radial pulse 2+ bilaterally Pulmonary:     Effort: Pulmonary effort is normal. No respiratory distress.     Breath sounds: Normal breath sounds.  Abdominal:     Palpations: Abdomen is soft.     Tenderness: There is no abdominal tenderness.  Musculoskeletal:        General: No swelling.     Cervical back: Neck supple.     Comments: No lower extremity edema  Skin:    General: Skin is warm and dry.     Capillary Refill: Capillary refill takes less than 2 seconds.  Neurological:     Mental Status: She is alert.  Psychiatric:        Mood and Affect: Mood normal.     ED Results / Procedures / Treatments   Labs (all labs ordered are listed, but only abnormal results are displayed) Labs Reviewed  CBC WITH DIFFERENTIAL/PLATELET - Abnormal; Notable for the following components:      Result Value   Hemoglobin 10.5 (*)    MCV 77.4 (*)    MCH 22.6 (*)    MCHC 29.2 (*)    RDW 17.2 (*)    All other components within normal limits  COMPREHENSIVE METABOLIC PANEL - Abnormal; Notable for the following components:   CO2 16 (*)    Glucose, Bld 105 (*)    BUN 50 (*)    Creatinine, Ser 2.86 (*)    Albumin 3.4 (*)    GFR, Estimated 16 (*)    All other components within normal limits  BRAIN NATRIURETIC PEPTIDE - Abnormal; Notable  for the following components:   B Natriuretic Peptide 416.5 (*)    All other components within normal limits  TROPONIN I (HIGH SENSITIVITY) - Abnormal; Notable for the following components:   Troponin I (High Sensitivity) 67 (*)    All other components within normal limits  TROPONIN I (HIGH SENSITIVITY) - Abnormal; Notable for the following components:   Troponin I (High Sensitivity) 78 (*)    All other components within normal limits  SARS CORONAVIRUS 2 BY RT PCR  HEPARIN LEVEL (UNFRACTIONATED)  CBC    EKG EKG Interpretation Date/Time:  Saturday July 22 2023 11:10:04 EDT Ventricular Rate:  68 PR Interval:  167 QRS Duration:  87 QT Interval:  432 QTC Calculation: 460 R Axis:   -10  Text Interpretation: Sinus rhythm Abnormal R-wave progression, late transition Consider left ventricular hypertrophy No acute changes No significant change since last tracing Confirmed by Derwood Kaplan [phone]) on 07/22/2023 11:50:44 AM  Radiology CT Chest Wo Contrast  Result Date: 07/22/2023 CLINICAL DATA:  PE suspected versus pneumonia EXAM: CT CHEST WITHOUT CONTRAST TECHNIQUE: Multidetector CT imaging of the chest was performed following the standard protocol without IV contrast. RADIATION DOSE REDUCTION: This exam was performed according to the departmental dose-optimization program which includes automated exposure control, adjustment of the mA and/or kV according to patient size and/or use of iterative reconstruction technique. COMPARISON:  None Available. FINDINGS: Cardiovascular: Aortic atherosclerosis. Normal heart size. Enlargement of the main pulmonary artery measuring up to 3.7 cm in caliber. Three-vessel coronary artery calcifications. No pericardial effusion. Mediastinum/Nodes: No enlarged mediastinal, hilar, or axillary lymph nodes. Small hiatal hernia. Thyroid gland, trachea, and esophagus demonstrate no significant findings. Lungs/Pleura: Heterogeneous and consolidative airspace disease  throughout the left upper lobe (series 4, image 28). No pleural effusion or pneumothorax. Upper Abdomen: No acute abnormality. Partially imaged right nephrectomy. Musculoskeletal: No chest wall abnormality. No acute osseous findings. IMPRESSION: 1. Heterogeneous and consolidative airspace disease throughout the left upper lobe, consistent with pneumonia. Recommend follow-up to resolution to exclude underlying neoplasm. 2. No meaningful evaluation for pulmonary embolism on this noncontrast examination. 3. Enlargement of the main pulmonary artery, as can be seen in pulmonary hypertension. 4. Coronary artery disease. Aortic Atherosclerosis (ICD10-I70.0). Electronically Signed   By: Jearld Lesch M.D.   On: 07/22/2023 16:37   VAS [place] LOWER EXTREMITY VENOUS (DVT) (ONLY [facility] & [facility])  Result Date: 07/22/2023  Lower Venous DVT Study Patient Name:  Delita [facility]  Date of Exam:   07/22/2023 Medical Rec #:  [id number]    Accession #:    [id number] Date of Birth: 1941/10/20    Patient Gender: F Patient Age:   58 years Exam Location:  [facility] Procedure:      VAS [place] LOWER EXTREMITY VENOUS (DVT) Referring Phys: Arabella Merles --------------------------------------------------------------------------------  Indications: SOB.  Comparison Study: No prior study on file Performing Technologist: Sherren Kerns RVS  Examination Guidelines: A complete evaluation includes B-mode imaging, spectral Doppler, color Doppler, and power Doppler as needed of all accessible portions of each vessel. Bilateral testing is considered an integral part of a complete examination. Limited examinations for reoccurring indications may be performed as noted. The reflux portion of the exam is performed with the patient in reverse Trendelenburg.  +---------+---------------+---------+-----------+----------+--------------+ RIGHT    CompressibilityPhasicitySpontaneityPropertiesThrombus Aging  +---------+---------------+---------+-----------+----------+--------------+ CFV      Full           Yes      Yes                                 +---------+---------------+---------+-----------+----------+--------------+ SFJ      Full                                                        +---------+---------------+---------+-----------+----------+--------------+ FV Prox  Full                                                        +---------+---------------+---------+-----------+----------+--------------+ FV Mid   Full                                                        +---------+---------------+---------+-----------+----------+--------------+ FV DistalFull                                                        +---------+---------------+---------+-----------+----------+--------------+ PFV      Full                                                        +---------+---------------+---------+-----------+----------+--------------+ POP      Full           Yes      Yes                                 +---------+---------------+---------+-----------+----------+--------------+ PTV      None  Acute          +---------+---------------+---------+-----------+----------+--------------+ PERO     None                                         Acute          +---------+---------------+---------+-----------+----------+--------------+   +---------+---------------+---------+-----------+----------+--------------+ LEFT     CompressibilityPhasicitySpontaneityPropertiesThrombus Aging +---------+---------------+---------+-----------+----------+--------------+ CFV      Full           Yes      Yes                                 +---------+---------------+---------+-----------+----------+--------------+ SFJ      Full                                                         +---------+---------------+---------+-----------+----------+--------------+ FV Prox  Full                                                        +---------+---------------+---------+-----------+----------+--------------+ FV Mid   Full                                                        +---------+---------------+---------+-----------+----------+--------------+ FV DistalFull                                                        +---------+---------------+---------+-----------+----------+--------------+ PFV      Full                                                        +---------+---------------+---------+-----------+----------+--------------+ POP      Full           Yes      Yes                                 +---------+---------------+---------+-----------+----------+--------------+ PTV      Full                                                        +---------+---------------+---------+-----------+----------+--------------+ PERO     Full                                                        +---------+---------------+---------+-----------+----------+--------------+  Summary: BILATERAL: -No evidence of popliteal cyst, bilaterally. RIGHT: - Findings consistent with acute deep vein thrombosis involving the right posterior tibial veins, and right peroneal veins.   LEFT: - There is no evidence of deep vein thrombosis in the lower extremity.  *See table(s) above for measurements and observations.    Preliminary    DG Chest Portable 1 View  Result Date: 07/22/2023 CLINICAL DATA:  Dyspnea for 3 weeks EXAM: PORTABLE CHEST 1 VIEW COMPARISON:  None Available. FINDINGS: Consolidative left upper lobe opacity. No pneumothorax, effusion or edema. Normal cardiopericardial silhouette. Calcified aorta. Right shoulder reverse arthroplasty. Overlapping cardiac leads. IMPRESSION: Left upper lung consolidative opacity. Possible pneumonia. However recommend follow-up to  confirm resolution. Electronically Signed   By: Karen Kays M.D.   On: 07/22/2023 12:49    Procedures .Critical Care  Performed by: Arabella Merles, PA-C Authorized by: Arabella Merles, PA-C   Critical care provider statement:    Critical care time (minutes):  35   Critical care was necessary to treat or prevent imminent or life-threatening deterioration of the following conditions: Dyspnea, DVT/PE, pneumonia.   Critical care was time spent personally by me on the following activities:  Development of treatment plan with patient or surrogate, discussions with consultants, evaluation of patient's response to treatment, examination of patient, ordering and review of laboratory studies, ordering and review of radiographic studies, ordering and performing treatments and interventions, pulse oximetry, re-evaluation of patient's condition and review of old charts Comments:     Patient needed frequent reevaluation, evaluation of lab work and results and ordering of further studies, talking to hospitalist and VQ technician.  Spent over 15 minutes talking to the family and patient about results     Medications Ordered in ED Medications  heparin ADULT infusion 100 units/mL (25000 units/232mL) (1,300 Units/hr Intravenous New Bag/Given 07/22/23 1712)  cefTRIAXone (ROCEPHIN) 1 g in sodium chloride 0.9 % 100 mL IVPB (1 g Intravenous New Bag/Given 07/22/23 1707)  doxycycline (VIBRAMYCIN) 100 mg in sodium chloride 0.9 % 250 mL IVPB (100 mg Intravenous New Bag/Given 07/22/23 1719)  aspirin chewable tablet 324 mg (324 mg Oral Given 07/22/23 1440)  heparin bolus via infusion 5,000 Units (5,000 Units Intravenous Bolus from Bag 07/22/23 1712)    ED Course/ Medical Decision Making/ A&P                                 Medical Decision Making Amount and/or Complexity of Data Reviewed Labs: ordered. Radiology: ordered.  Risk OTC drugs. Prescription drug management. Decision regarding  hospitalization.   82 y.o. female with pertinent past medical history of CKD, diabetes, hypertension presents to the ED for concern of 3 weeks of shortness of breath  Differential diagnosis includes but is not limited to COVID, pneumonia, viral URI, pulmonary embolism, arrhythmia, ACS, heart failure  ED Course:  Patient presents with concern for 3 weeks of shortness of breath which started when she was switched from her amlodipine to labetalol.  She was seen at urgent care earlier today where they noted an initial triage oxygen saturation of 86% on room air and sent to the ER for further evaluation.  However, today upon initial evaluation she is satting 96% on room air.  She is overall comfortable appearing and talking in mostly full sentences with some pausing to catch her breath.  Vital signs all within normal range except for a elevated blood pressure 172/82, unknown what her baseline normally  is.  1:03 Upon re-evaluation, patient still comfortable appearing in bed, not requiring any oxygen.  Chest x-ray shows a consolidation in the left upper lobe, could be possible pneumonia but think this is less likely as patient does not have a leukocytosis, no fever or chills.  However, will start on IV doxycycline and ceftriaxone given the chest x-ray finding and cough.  COVID-negative.  BNP is elevated at 416, unknown what her baseline is.  Her troponin is also elevated at 67 but EKG with normal sinus rhythm.  Given her creatinine is elevated at 2.86 will obtain V/Q perfusion study to evaluate for PE in the setting of shortness of breath and elevated troponin.  It appears that patient has an AKI as creatinine is 2.86 today, up from 1.85 3 months ago. No concern for sepsis at this time as patient does not meet SIRS criteria. Upon repeat, troponin has increased from 67 to 78.  Will continue to trend this.  Chewable aspirin 324mg  ordered. Concern for PE vs NSTEMI. VQ scan unable to be performed today as they are  not here. I spoke the the technician on the phone and we will plan to have the study performed tomorrow inpatient.  We will proceed with CT chest without contrast and bilateral lower extremity DVT study.  Patient was started on IV heparin due to suspicion for PE.   4:33 PM I was notified by the ultrasound technician that patient has acute DVT in the right posterior tibial and peroneal veins.  The CT chest shows consolidation in the left upper lobe that is consistent with pneumonia, however they recommended follow-up to resolution to ensure this is not a neoplasm.  Patient started on IV ceftriaxone and doxycycline for pneumonia   Impression: AKI Pneumonia DVT right posterior tibial and peroneal veins Possible PE    Disposition:  Admitted for further treatment by Dr. Core  Lab Tests: I Ordered, and personally interpreted labs.  The pertinent results include:   Initial troponin elevated at 67, second uptrended to 78 CBC with no leukocytosis CMP with elevated creatinine at 2.86, this is up from 1.85 3 months ago BNP elevated at 416, unknown what baseline is Covid negative  Imaging Studies ordered: I ordered imaging studies including chest x-ray, bilateral lower extremity ultrasound, CT chest non-contrast I independently visualized the imaging with scope of interpretation limited to determining acute life threatening conditions related to emergency care. Chest x ray Imaging showed consolidation in the left upper lobe, recommended follow-up to ensure resolutions to exclude underlying neoplasm CT chest showed consolidation in the left upper lobe US showed  DVT in the right posterior tibial and peroneal veins.  I agree with the radiologist interpretation   Cardiac Monitoring: / EKG: The patient was maintained on a cardiac monitor.  I personally viewed and interpreted the cardiac monitored which showed an underlying rhythm of: Normal sinus rhythm   Consultations Obtained: I requested  consultation with the Hospitalist Dr. Dorna Mai,  and discussed lab and imaging findings as well as pertinent plan - they recommend: Admission   Co morbidities that complicate the patient evaluation  CKD, diabetes, hypertension              Final Clinical Impression(s) / ED Diagnoses Final diagnoses:  Acute deep vein thrombosis (DVT) of right lower extremity, unspecified vein (HCC)  Dyspnea, unspecified type    Rx / DC Orders ED Discharge Orders     None         Arabella Merles, PA-C  07/22/23 1722    Derwood Kaplan, MD 07/22/23 2057

## 2023-07-22 NOTE — ED Notes (Signed)
Echo currently at bedside

## 2023-07-22 NOTE — ED Notes (Signed)
Unsuccessful IV attempt, Provider made aware. EMS at bedside.

## 2023-07-23 ENCOUNTER — Inpatient Hospital Stay (HOSPITAL_COMMUNITY): Payer: Medicare Other

## 2023-07-23 DIAGNOSIS — R0602 Shortness of breath: Secondary | ICD-10-CM

## 2023-07-23 DIAGNOSIS — I824Y1 Acute embolism and thrombosis of unspecified deep veins of right proximal lower extremity: Secondary | ICD-10-CM

## 2023-07-23 DIAGNOSIS — R7989 Other specified abnormal findings of blood chemistry: Secondary | ICD-10-CM

## 2023-07-23 LAB — CBC
HCT: 28.6 % — ABNORMAL LOW (ref 36.0–46.0)
Hemoglobin: 8.9 g/dL — ABNORMAL LOW (ref 12.0–15.0)
MCH: 22.4 pg — ABNORMAL LOW (ref 26.0–34.0)
MCHC: 31.1 g/dL (ref 30.0–36.0)
MCV: 71.9 fL — ABNORMAL LOW (ref 80.0–100.0)
Platelets: 195 10*3/uL (ref 150–400)
RBC: 3.98 MIL/uL (ref 3.87–5.11)
RDW: 16.9 % — ABNORMAL HIGH (ref 11.5–15.5)
WBC: 7.5 10*3/uL (ref 4.0–10.5)
nRBC: 0 % (ref 0.0–0.2)

## 2023-07-23 LAB — GLUCOSE, CAPILLARY
Glucose-Capillary: 96 mg/dL (ref 70–99)
Glucose-Capillary: 83 mg/dL (ref 70–99)
Glucose-Capillary: 131 mg/dL — ABNORMAL HIGH (ref 70–99)
Glucose-Capillary: 124 mg/dL — ABNORMAL HIGH (ref 70–99)

## 2023-07-23 LAB — URINALYSIS, ROUTINE W REFLEX MICROSCOPIC
Bilirubin Urine: NEGATIVE
Glucose, UA: 150 mg/dL — AB
Hgb urine dipstick: NEGATIVE
Ketones, ur: NEGATIVE mg/dL
Nitrite: NEGATIVE
Protein, ur: >=300 mg/dL — AB
Specific Gravity, Urine: 1.018 (ref 1.005–1.030)
WBC, UA: >50 WBC/hpf (ref 0–5)
pH: 5 (ref 5.0–8.0)

## 2023-07-23 LAB — COMPREHENSIVE METABOLIC PANEL
ALT: 14 U/L (ref 0–44)
AST: 17 U/L (ref 15–41)
Albumin: 2.8 g/dL — ABNORMAL LOW (ref 3.5–5.0)
Alkaline Phosphatase: 70 U/L (ref 38–126)
Anion gap: 8 (ref 5–15)
BUN: 51 mg/dL — ABNORMAL HIGH (ref 8–23)
CO2: 17 mmol/L — ABNORMAL LOW (ref 22–32)
Calcium: 8.7 mg/dL — ABNORMAL LOW (ref 8.9–10.3)
Chloride: 112 mmol/L — ABNORMAL HIGH (ref 98–111)
Creatinine, Ser: 2.71 mg/dL — ABNORMAL HIGH (ref 0.44–1.00)
GFR, Estimated: 17 mL/min — ABNORMAL LOW (ref >60)
Glucose, Bld: 98 mg/dL (ref 70–99)
Potassium: 4.4 mmol/L (ref 3.5–5.1)
Sodium: 137 mmol/L (ref 135–145)
Total Bilirubin: 0.8 mg/dL (ref 0.3–1.2)
Total Protein: 6.2 g/dL — ABNORMAL LOW (ref 6.5–8.1)

## 2023-07-23 LAB — ECHOCARDIOGRAM COMPLETE
Area-P 1/2: 3.27 cm2
Height: 62 in
MV M vel: 1.2 m/s
MV Peak grad: 5.8 mmHg
S' Lateral: 2.9 cm
Weight: 2888.91 [oz_av]

## 2023-07-23 LAB — SODIUM, URINE, RANDOM: Sodium, Ur: 44 mmol/L

## 2023-07-23 LAB — CREATININE, URINE, RANDOM: Creatinine, Urine: 168 mg/dL

## 2023-07-23 LAB — HEPARIN LEVEL (UNFRACTIONATED)
Heparin Unfractionated: 0.87 [IU]/mL — ABNORMAL HIGH (ref 0.30–0.70)
Heparin Unfractionated: 0.66 [IU]/mL (ref 0.30–0.70)
Heparin Unfractionated: 0.5 [IU]/mL (ref 0.30–0.70)

## 2023-07-23 MED ORDER — STERILE WATER FOR INJECTION IV SOLN
INTRAVENOUS | Status: DC
  Filled 2023-07-23: qty 1000

## 2023-07-23 MED ORDER — SODIUM CHLORIDE 0.9 % IV SOLN: INTRAVENOUS | Status: DC

## 2023-07-23 MED ORDER — TECHNETIUM TO 99M ALBUMIN AGGREGATED
4.2000 | Freq: Once | INTRAVENOUS | Status: AC | PRN
  Administered 2023-07-23: 4.2 via INTRAVENOUS

## 2023-07-23 MED ORDER — SODIUM CHLORIDE 0.9 % IV BOLUS
500.0000 mL | Freq: Once | INTRAVENOUS | Status: AC
  Administered 2023-07-23: 500 mL via INTRAVENOUS

## 2023-07-23 NOTE — Progress Notes (Signed)
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  Workup revealed right lower extremity DVT for which she was started on heparin drip.  VQ scan was ordered for suspected PE.  She was also found to have left upper lobe pneumonia along with acute kidney injury.  She was started on IV fluids and antibiotics.  Nephrology was consulted.  Assessment & Plan:   Acute right lower extremity DVT Possible acute PE -Presented with worsening shortness of breath and was found to have right lower extremity DVT.  Currently on heparin drip.  Follow-up VQ scan.  Follow 2D echo.  Probable community-acquired left upper lobar pneumonia -Continue Rocephin and doxycycline.  DC Flagyl.  Respiratory virus panel and COVID-19 negative. -Currently on 2 L oxygen via nasal cannula.  Wean off as able.  Acute kidney injury on CKD stage IIIb Acute metabolic acidosis History of right radical nephrectomy on 03/30/2023 -Baseline creatinine of 1.5 from April 2024.  Creatinine 1.8 on the day of discharge after nephrectomy. -Creatinine 2.86 on presentation.  Improving to 2.71.  Nephrology following.  IV fluids as per nephrology recommendations -Repeat a.m. labs including bicarb  Patient still hypertension -Monitor blood pressure.  Continue labetalol.  Amlodipine was discontinued as an outpatient  Hyperlipidemia Continue statin  Microcytic anemia -Questionable cause.  Hemoglobin stable.  Monitor intermittently  Mildly positive troponin -Most likely from demand ischemia.  No chest pain.  Follow 2D echo.  Aspirin has been started.  Obesity Outpatient follow-up   DVT prophylaxis: Heparin drip Code Status: Full Family  Communication: None at bedside Disposition Plan: Status is: Inpatient Remains inpatient appropriate because: Of severity of illness    Consultants: Nephrology  Procedures: None  Antimicrobials: Rocephin, Flagyl and doxycycline from 07/22/2023 onwards   Subjective: Patient seen and examined at bedside.  Breathing is slightly better.  Denies any current chest pain, fever or vomiting.  Objective: Vitals:   07/22/23 2307 07/23/23 0614 07/23/23 0709 07/23/23 0836  BP: (!) 144/71   (!) 148/72  Pulse: 77 67  69  Resp: 20 20  20   Temp: 98.1 F (36.7 C) 98.3 F (36.8 C)  97.8 F (36.6 C)  TempSrc: Oral Oral  Oral  SpO2: 95% 90%  94%  Weight:   81.9 kg   Height:        Intake/Output Summary (Last 24 hours) at 07/23/2023 1102 Last data filed at 07/23/2023 0615 Gross per 24 hour  Intake 1546.66 ml  Output 60 ml  Net 1486.66 ml   Filed Weights   07/22/23 1924 07/23/23 0709  Weight: 82.8 kg 81.9 kg    Examination:  General exam: Appears calm and comfortable.  On 2 L oxygen via nasal cannula. Respiratory system: Bilateral decreased breath sounds at bases with scattered crackles Cardiovascular system: S1 & S2 heard, Rate controlled Gastrointestinal system: Abdomen is obese, nondistended, soft and nontender. Normal bowel sounds heard. Extremities: No cyanosis, clubbing; right lower extremity edema present Central nervous system: Alert and oriented. No focal neurological deficits. Moving extremities Skin: No rashes, lesions or ulcers Psychiatry: Judgement and insight appear normal. Mood & affect appropriate.     Data Reviewed: I have personally reviewed following labs and imaging studies  CBC: Recent Labs  Lab 07/22/23 1108 07/23/23  0422  WBC 8.8 7.5  NEUTROABS 6.6  --   HGB 10.5* 8.9*  HCT 36.0 28.6*  MCV 77.4* 71.9*  PLT 174 195   Basic Metabolic Panel: Recent Labs  Lab 07/22/23 1108 07/23/23 0422  NA 137 137  K 4.6 4.4  CL 110 112*  CO2 16* 17*  GLUCOSE  105* 98  BUN 50* 51*  CREATININE 2.86* 2.71*  CALCIUM 9.6 8.7*   GFR: Estimated Creatinine Clearance: 15.9 mL/min (A) (by C-G formula based on SCr of 2.71 mg/dL (H)). Liver Function Tests: Recent Labs  Lab 07/22/23 1108 07/23/23 0422  AST 22 17  ALT 15 14  ALKPHOS 82 70  BILITOT 0.9 0.8  PROT 7.5 6.2*  ALBUMIN 3.4* 2.8*   No results for input(s): "LIPASE", "AMYLASE" in the last 168 hours. No results for input(s): "AMMONIA" in the last 168 hours. Coagulation Profile: No results for input(s): "INR", "PROTIME" in the last 168 hours. Cardiac Enzymes: No results for input(s): "CKTOTAL", "CKMB", "CKMBINDEX", "TROPONINI" in the last 168 hours. BNP (last 3 results) No results for input(s): "PROBNP" in the last 8760 hours. HbA1C: No results for input(s): "HGBA1C" in the last 72 hours. CBG: Recent Labs  Lab 07/22/23 2120 07/23/23 0616  GLUCAP 151* 96   Lipid Profile: No results for input(s): "CHOL", "HDL", "LDLCALC", "TRIG", "CHOLHDL", "LDLDIRECT" in the last 72 hours. Thyroid Function Tests: No results for input(s): "TSH", "T4TOTAL", "FREET4", "T3FREE", "THYROIDAB" in the last 72 hours. Anemia Panel: No results for input(s): "VITAMINB12", "FOLATE", "FERRITIN", "TIBC", "IRON", "RETICCTPCT" in the last 72 hours. Sepsis Labs: No results for input(s): "PROCALCITON", "LATICACIDVEN" in the last 168 hours.  Recent Results (from the past 240 hour(s))  SARS Coronavirus 2 by RT PCR (hospital order, performed in [facility] hospital lab) *cepheid single result test* Anterior Nasal Swab     Status: None   Collection Time: 07/22/23 11:20 AM   Specimen: Anterior Nasal Swab  Result Value Ref Range Status   SARS Coronavirus 2 by RT PCR NEGATIVE NEGATIVE Final    Comment: Performed at [facility], [address], [place], [place] [address]  Respiratory (~20 pathogens) panel by PCR     Status: None   Collection Time: 07/22/23 11:20 AM   Specimen: Nasopharyngeal Swab; Respiratory   Result Value Ref Range Status   Adenovirus NOT DETECTED NOT DETECTED Final   Coronavirus 229E NOT DETECTED NOT DETECTED Final    Comment: (NOTE) The Coronavirus on the Respiratory Panel, DOES NOT test for the novel  Coronavirus (2019 nCoV)    Coronavirus HKU1 NOT DETECTED NOT DETECTED Final   Coronavirus NL63 NOT DETECTED NOT DETECTED Final   Coronavirus OC43 NOT DETECTED NOT DETECTED Final   Metapneumovirus NOT DETECTED NOT DETECTED Final   Rhinovirus / Enterovirus NOT DETECTED NOT DETECTED Final   Influenza A NOT DETECTED NOT DETECTED Final   Influenza B NOT DETECTED NOT DETECTED Final   Parainfluenza Virus 1 NOT DETECTED NOT DETECTED Final   Parainfluenza Virus 2 NOT DETECTED NOT DETECTED Final   Parainfluenza Virus 3 NOT DETECTED NOT DETECTED Final   Parainfluenza Virus 4 NOT DETECTED NOT DETECTED Final   Respiratory Syncytial Virus NOT DETECTED NOT DETECTED Final   Bordetella pertussis NOT DETECTED NOT DETECTED Final   Bordetella Parapertussis NOT DETECTED NOT DETECTED Final   Chlamydophila pneumoniae NOT DETECTED NOT DETECTED Final   Mycoplasma pneumoniae NOT DETECTED NOT DETECTED Final    Comment: Performed at [facility], [address]  [place], [place] [address]      Scheduled Meds:  aspirin EC  81 mg Oral Daily   atorvastatin  20 mg Oral QHS   cholecalciferol  2,000 Units Oral Daily   insulin aspart  0-5 Units Subcutaneous QHS   insulin aspart  0-9 Units Subcutaneous TID WC   labetalol  200 mg Oral BID   sodium bicarbonate  650 mg Oral TID   Continuous Infusions:  cefTRIAXone (ROCEPHIN)  IV     doxycycline (VIBRAMYCIN) IV 100 mg (07/23/23 0723)   heparin 1,150 Units/hr (07/23/23 7829)   metronidazole 500 mg (07/23/23 0615)   sodium bicarbonate 150 mEq in sterile water 1,150 mL infusion            Glade Lloyd, MD [facility] 07/23/2023, 11:02 AM

## 2023-07-23 NOTE — Progress Notes (Addendum)
ANTICOAGULATION CONSULT NOTE  Pharmacy Consult for heparin Indication: DVT and PE  Allergies  Allergen Reactions   Simvastatin Cough    Patient Measurements: Height: 5\' 2"  (157.5 cm) Weight: 81.9 kg (180 lb 8.9 oz) IBW/kg (Calculated) : 50.1 Heparin Dosing Weight: 78kg  Vital Signs: Temp: 98.2 F (36.8 C) (08/25 1146) Temp Source: Oral (08/25 1146) BP: 138/69 (08/25 1146) Pulse Rate: 65 (08/25 1146)  Labs: Recent Labs    07/22/23 1108 07/22/23 1305 07/23/23 0102 07/23/23 0422 07/23/23 1146  HGB 10.5*  --   --  8.9*  --   HCT 36.0  --   --  28.6*  --   PLT 174  --   --  195  --   HEPARINUNFRC  --   --  0.87*  --  0.66  CREATININE 2.86*  --   --  2.71*  --   TROPONINIHS 67* 78*  --   --   --     Estimated Creatinine Clearance: 15.9 mL/min (A) (by C-G formula based on SCr of 2.71 mg/dL (H)).  Assessment: 82 y.o. female with DVT and PE confirmed by VQ scan on heparin. Not on anticoagulation prior to admission.  Heparin level 0.66, therapeutic. No issues with infusion or s/sx of bleeding per RN. Hgb dropped from 10.5 to 8.9 but platelets remain stable.  Goal of Therapy:  Heparin level 0.3-0.7 units/ml Monitor platelets by anticoagulation protocol: Yes   Plan:  Continue heparin infusion at 1150 units/hr Check confirmatory heparin level in 8 hours Monitor daily HL, CBC, s/sx of bleeding  Nicole Kindred, PharmD PGY1 Pharmacy Resident 07/23/2023 12:44 PM

## 2023-07-23 NOTE — Progress Notes (Addendum)
[facility] Progress Note  Subjective: SOB better/ improving per patient. No new c/o's.   Vitals:   07/22/23 1720 07/22/23 1924 07/22/23 2307 07/23/23 0614  BP: (!) 158/83 (!) 155/80 (!) 144/71   Pulse: 69 81 77 67  Resp: (!) 25 20 20 20   Temp:  97.9 F (36.6 C) 98.1 F (36.7 C) 98.3 F (36.8 C)  TempSrc:  Oral Oral Oral  SpO2: 94% 91% 95% 90%  Weight:  82.8 kg    Height:  5\' 2"  (1.575 m)      Exam: Gen alert, no distress No jvd or bruits Chest clear bilat to bases RRR no RG Abd soft ntnd no mass or ascites +bs Ext no LE or UE edema Neuro is alert, Ox 3 , nf       Home meds include - lipitor, vit D3, labetalol 100 bid      Date                          Creat               eGFR   feb 2017                    1.62                 35 ml/min   sept 2021                  1.41                 36 ml/min (CE)   sept 2022                  1.76                 28 ml/min (CE)   03/16/23                      1.50                 35 ml/min   03/30/23                        1.85                 R radical nephrectomy done 03/29/23   07/22/23                      2.86                 16 ml/min       Renal US nov 2023 - 10- 11 cm kidneys, R upper pole w/ mass 5-6 cm     Renal US 07/22/23 - R kidney absent; L kidney 10.2 cm, normal echo, no hydro     CT chest noncon --> IMPRESSION: 1. Heterogeneous and consolidative airspace disease throughout the left upper lobe, consistent with pneumonia. Recommend follow-up to resolution to exclude underlying neoplasm. 2. No meaningful evaluation for pulmonary embolism on this noncontrast examination. 3. Enlargement of the main pulmonary artery, as can be seen in pulmonary hypertension. 4. Coronary artery disease.     CXR - IMPRESSION: Left upper lung consolidative opacity. Possible pneumonia     VS today --> BP 158/83, HR 69,  RR 18, temp 97     Room air  95%  UA - glu 150, prot > 300, many bact, 0-5 rbc, > 50 wbc, epi 6-10     UNa 44,   UCr 168         Assessment/ Plan: AKI on CKD 3b - b/l creatinine 1.50 from April 2024, 2 wks prior to R nephrectomy, eGFR 35 ml/min. The day after the nephrectomy her creat was 1.8 then she was dc'd home. Pt is f/b Dr Ronalee Belts at [facility]. Creat was 2.86 here on admission in the setting of SOB, RLE DVT and possible acute PE. Poor appetite for last 2 wks per patient. Renal US here shows no obstruction of the solitary L kidney. UA +wbc/ protein, urine lytes nondescript. BP's wnl. No vol overload on exam. Creat 2.8 seems a bit higher than would be expected after nephrectomy in May, given pre-op creat of 1.5. AKI may be related to poor po intake and volume depletion. Or creat 2.8 could be her new baseline. No home meds implicated, no contrast.  IVF's were ^'d to 75 cc/hr last night. Creat down only slightly at 2.7 today. SOB better today, likely due to IV heparin. Lungs clear/ no edema. Will give 500 cc NS bolus and ^IVF to 100 cc/hr, f/u creatinine in am.  Metabolic acidosis - getting po sod bicarb tabs, continue Acute DVT RLE - w/ suspected PE on non-contrast CT, is on IV heparin  HTN - ^'d labetalol to 200 bid yesterday, BP's better.  PNA - left upper lobe by CXR/ CT, on IV abx per pmd      Vinson Moselle MD  [facility] 07/23/2023, 6:44 AM  Recent Labs  Lab 07/22/23 1108 07/23/23 0422  HGB 10.5* 8.9*  ALBUMIN 3.4* 2.8*  CALCIUM 9.6 8.7*  CREATININE 2.86* 2.71*  K 4.6 4.4   No results for input(s): "IRON", "TIBC", "FERRITIN" in the last 168 hours. Inpatient medications:  aspirin EC  81 mg Oral Daily   atorvastatin  20 mg Oral QHS   cholecalciferol  2,000 Units Oral Daily   insulin aspart  0-5 Units Subcutaneous QHS   insulin aspart  0-9 Units Subcutaneous TID WC   labetalol  200 mg Oral BID   sodium bicarbonate  650 mg Oral TID    sodium chloride 75 mL/hr at 07/23/23 0144   cefTRIAXone (ROCEPHIN)  IV     doxycycline (VIBRAMYCIN) IV 100 mg (07/22/23 1719)   heparin 1,150 Units/hr (07/23/23 0223)    metronidazole 500 mg (07/23/23 0615)   hydrALAZINE

## 2023-07-23 NOTE — Progress Notes (Signed)
ANTICOAGULATION CONSULT NOTE  Pharmacy Consult for heparin Indication: DVT and PE  Allergies  Allergen Reactions   Simvastatin Cough    Patient Measurements: Height: 5\' 2"  (157.5 cm) Weight: 81.9 kg (180 lb 8.9 oz) IBW/kg (Calculated) : 50.1 Heparin Dosing Weight: 78kg  Vital Signs: Temp: 98.2 F (36.8 C) (08/25 2032) Temp Source: Oral (08/25 2032) BP: 153/81 (08/25 2032) Pulse Rate: 81 (08/25 1548)  Labs: Recent Labs    07/22/23 1108 07/22/23 1305 07/23/23 0102 07/23/23 0422 07/23/23 1146 07/23/23 2039  HGB 10.5*  --   --  8.9*  --   --   HCT 36.0  --   --  28.6*  --   --   PLT 174  --   --  195  --   --   HEPARINUNFRC  --   --  0.87*  --  0.66 0.50  CREATININE 2.86*  --   --  2.71*  --   --   TROPONINIHS 67* 78*  --   --   --   --     Estimated Creatinine Clearance: 15.9 mL/min (A) (by C-G formula based on SCr of 2.71 mg/dL (H)).  Assessment: 82 y.o. female with DVT and PE confirmed by VQ scan on heparin. Not on anticoagulation prior to admission.  Heparin level remains therapeutic on 1150 units/hr  Goal of Therapy:  Heparin level 0.3-0.7 units/ml Monitor platelets by anticoagulation protocol: Yes   Plan:  Continue heparin gtt at 1150 units/hr Daily heparin level, CBC, s/s bleeding  Daylene Posey, PharmD, [facility] Clinical Pharmacist ED Pharmacist Phone # [phone] 07/23/2023 9:18 PM

## 2023-07-23 NOTE — Progress Notes (Signed)
  Echocardiogram 2D Echocardiogram has been performed.  Maren Reamer 07/23/2023, 3:22 PM

## 2023-07-23 NOTE — Progress Notes (Signed)
ANTICOAGULATION CONSULT NOTE  Pharmacy Consult for heparin Indication: DVT and possible PE Brief A/P: Heparin level supratherapeutic Decrease Heparin rate  Allergies  Allergen Reactions   Simvastatin Cough    Patient Measurements: Height: 5\' 2"  (157.5 cm) Weight: 82.8 kg (182 lb 8.7 oz) IBW/kg (Calculated) : 50.1 Heparin Dosing Weight: 78kg  Vital Signs: Temp: 98.1 F (36.7 C) (08/24 2307) Temp Source: Oral (08/24 2307) BP: 144/71 (08/24 2307) Pulse Rate: 77 (08/24 2307)  Labs: Recent Labs    07/22/23 1108 07/22/23 1305 07/23/23 0102  HGB 10.5*  --   --   HCT 36.0  --   --   PLT 174  --   --   HEPARINUNFRC  --   --  0.87*  CREATININE 2.86*  --   --   TROPONINIHS 67* 78*  --     Estimated Creatinine Clearance: 15.1 mL/min (A) (by C-G formula based on SCr of 2.86 mg/dL (H)).  Assessment: 82 y.o. female with DVT and possible PE awaiting VQ scan for heparin  Goal of Therapy:  Heparin level 0.3-0.7 units/ml Monitor platelets by anticoagulation protocol: Yes   Plan:  Decrease Heparin 1150 units/hr Check heparin level in 8 hours.  Geannie Risen, PharmD, BCPS

## 2023-07-23 NOTE — Progress Notes (Signed)
   07/23/23 2032  Vitals  Temp 98.2 F (36.8 C)  Temp Source Oral  BP (!) 153/81  BP Location Left Wrist  BP Method Automatic  Patient Position (if appropriate) Lying  Level of Consciousness  Level of Consciousness Alert  Oxygen Therapy  SpO2 92 %  O2 Device Nasal Cannula  O2 Flow Rate (L/min) 6 L/min   Called to room by family member who says patient is coughing up blood. Patient was found to have pink tinged sputum and pink nasal discharge. No apparent occult bleeding. Called on-call physician regarding wet lung sounds and declining respiratory status. Orders given, will continue to closely monitor. Charge also made aware.

## 2023-07-24 ENCOUNTER — Other Ambulatory Visit (HOSPITAL_COMMUNITY): Payer: Self-pay

## 2023-07-24 ENCOUNTER — Encounter (HOSPITAL_COMMUNITY): Payer: Self-pay | Admitting: Internal Medicine

## 2023-07-24 DIAGNOSIS — I824Y1 Acute embolism and thrombosis of unspecified deep veins of right proximal lower extremity: Secondary | ICD-10-CM | POA: Diagnosis not present

## 2023-07-24 DIAGNOSIS — I2699 Other pulmonary embolism without acute cor pulmonale: Secondary | ICD-10-CM | POA: Diagnosis not present

## 2023-07-24 LAB — RENAL FUNCTION PANEL
Albumin: 2.8 g/dL — ABNORMAL LOW (ref 3.5–5.0)
Anion gap: 9 (ref 5–15)
BUN: 38 mg/dL — ABNORMAL HIGH (ref 8–23)
CO2: 15 mmol/L — ABNORMAL LOW (ref 22–32)
Calcium: 9 mg/dL (ref 8.9–10.3)
Chloride: 113 mmol/L — ABNORMAL HIGH (ref 98–111)
Creatinine, Ser: 2.36 mg/dL — ABNORMAL HIGH (ref 0.44–1.00)
GFR, Estimated: 20 mL/min — ABNORMAL LOW (ref >60)
Glucose, Bld: 101 mg/dL — ABNORMAL HIGH (ref 70–99)
Phosphorus: 3 mg/dL (ref 2.5–4.6)
Potassium: 4.3 mmol/L (ref 3.5–5.1)
Sodium: 137 mmol/L (ref 135–145)

## 2023-07-24 LAB — CBC
HCT: 31 % — ABNORMAL LOW (ref 36.0–46.0)
Hemoglobin: 9.5 g/dL — ABNORMAL LOW (ref 12.0–15.0)
MCH: 22.4 pg — ABNORMAL LOW (ref 26.0–34.0)
MCHC: 30.6 g/dL (ref 30.0–36.0)
MCV: 73.1 fL — ABNORMAL LOW (ref 80.0–100.0)
Platelets: 198 10*3/uL (ref 150–400)
RBC: 4.24 MIL/uL (ref 3.87–5.11)
RDW: 16.8 % — ABNORMAL HIGH (ref 11.5–15.5)
WBC: 8.2 10*3/uL (ref 4.0–10.5)
nRBC: 0 % (ref 0.0–0.2)

## 2023-07-24 LAB — GLUCOSE, CAPILLARY
Glucose-Capillary: 108 mg/dL — ABNORMAL HIGH (ref 70–99)
Glucose-Capillary: 102 mg/dL — ABNORMAL HIGH (ref 70–99)
Glucose-Capillary: 102 mg/dL — ABNORMAL HIGH (ref 70–99)
Glucose-Capillary: 129 mg/dL — ABNORMAL HIGH (ref 70–99)

## 2023-07-24 LAB — MAGNESIUM: Magnesium: 1.7 mg/dL (ref 1.7–2.4)

## 2023-07-24 LAB — HEPARIN LEVEL (UNFRACTIONATED): Heparin Unfractionated: 0.51 [IU]/mL (ref 0.30–0.70)

## 2023-07-24 MED ORDER — SODIUM BICARBONATE 650 MG PO TABS
1300.0000 mg | ORAL_TABLET | Freq: Two times a day (BID) | ORAL | Status: DC
  Administered 2023-07-24 – 2023-07-30 (×12): 1300 mg via ORAL
  Filled 2023-07-24 (×12): qty 2

## 2023-07-24 NOTE — TOC Benefit Eligibility Note (Signed)
 Patient Product/process development scientist completed.    The patient is insured through [employer]. Patient has Medicare and is not eligible for a copay card, but may be able to apply for patient assistance, if available.    Ran test claim for Eliquis 5 mg and the current 30 day co-pay is $30.00.   This test claim was processed through [facility]- copay amounts may vary at other pharmacies due to pharmacy/plan contracts, or as the patient moves through the different stages of their insurance plan.     Roland Earl, CPHT Pharmacy Technician III Certified Patient Advocate [facility] Patient Advocate Team Direct Number: [phone]  Fax: [phone]

## 2023-07-24 NOTE — Progress Notes (Signed)
Mobility Specialist Progress Note:   07/24/23 1158  Mobility  Activity Ambulated with assistance in hallway  Level of Assistance Contact guard assist, steadying assist  Assistive Device Front wheel walker  Distance Ambulated (ft) 25 ft  Activity Response Tolerated well  Mobility Referral Yes  $Mobility charge 1 Mobility  Mobility Specialist Start Time (ACUTE ONLY) 1130  Mobility Specialist Stop Time (ACUTE ONLY) 1155  Mobility Specialist Time Calculation (min) (ACUTE ONLY) 25 min   During Mobility: 79 HR , 92% SpO2 8 L Post Mobility: 65 HR , 94% SpO2 6 L  Pt received in bed, agreeable to mobility. HHA to stand. CG during ambulation. Upon standing pt c/o feeling wobbly requesting to use RW to ambulate greater distance than room. Pt able to ambulate short distance with slow gait in hallway without standing rest break. Pt displayed slight fatigue after session, pursed lip breathing encouraged but denied SOB during ambulation. Pt left in bed with call bell in reach and all needs met. Family present.   Leory Plowman  Mobility Specialist Please contact via [employer] office at [phone]

## 2023-07-24 NOTE — Evaluation (Addendum)
Physical Therapy Evaluation Patient Details Name: Dana Fleming MRN: [id number] DOB: 1941/02/12 Today's Date: 07/24/2023  History of Present Illness  Patient is an 82 y/o female admitted 07/22/23 with SOB, found to have DVT RLE and acute PE as well as L UL pneumonia and AKI.  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  Clinical Impression  Patient presents with decreased mobility due to decreased balance, decreased strength and decreased cardiorespiratory endurance.  Previously she was independent living with her niece completing ADL/IADL's.  Currently needing min A for mobility into hallway with RW limited by dyspnea with SpO2 90% on 6L O2.  Patient will benefit from skilled PT in the acute setting and from follow up HHPT at d/c.         If plan is discharge home, recommend the following: A little help with walking and/or transfers;A little help with bathing/dressing/bathroom;Assistance with cooking/housework;Assist for transportation;Help with stairs or ramp for entrance   Can travel by private vehicle        Equipment Recommendations Rolling walker (2 wheels)  Recommendations for Other Services       Functional Status Assessment Patient has had a recent decline in their functional status and demonstrates the ability to make significant improvements in function in a reasonable and predictable amount of time.     Precautions / Restrictions Precautions Precautions: Fall Precaution Comments: watch O2      Mobility  Bed Mobility Overal bed mobility: Needs Assistance Bed Mobility: Supine to Sit, Sit to Supine     Supine to sit: Min assist, HOB elevated, Used rails Sit to supine: Min assist, HOB elevated   General bed mobility comments: HHA to lift trunk upright, increased time scooting to EOB, assist for leg up over EOB and positioning to supine    Transfers Overall transfer level: Needs assistance Equipment used: Rolling walker (2  wheels) Transfers: Sit to/from Stand Sit to Stand: Min assist           General transfer comment: assist for balance    Ambulation/Gait Ambulation/Gait assistance: Min assist Gait Distance (Feet): 60 Feet Assistive device: Rolling walker (2 wheels) Gait Pattern/deviations: Step-to pattern, Decreased stride length, Trunk flexed       General Gait Details: heavy UE reliance on RW with fatigue and SOB throughout  Stairs            Wheelchair Mobility     Tilt Bed    Modified Rankin (Stroke Patients Only)       Balance Overall balance assessment: Needs assistance   Sitting balance-Leahy Scale: Good     Standing balance support: Bilateral upper extremity supported, Reliant on assistive device for balance Standing balance-Leahy Scale: Poor                               Pertinent Vitals/Pain Pain Assessment Pain Assessment: No/denies pain    Home Living Family/patient expects to be discharged to:: Private residence Living Arrangements: Other relatives (neice) Available Help at Discharge: Family;Available PRN/intermittently Type of Home: House Home Access: Stairs to enter Entrance Stairs-Rails: Right Entrance Stairs-Number of Steps: 3   Home Layout: One level Home Equipment: Cane - single point;Shower seat Additional Comments: just finished PT for R knee outpatient due to pain and shot    Prior Function Prior Level of Function : Independent/Modified Independent;Driving;History of Falls (last six months)  Mobility Comments: one fall tripped on deck about 2 months ago       Extremity/Trunk Assessment   Upper Extremity Assessment Upper Extremity Assessment: Generalized weakness    Lower Extremity Assessment Lower Extremity Assessment: Generalized weakness    Cervical / Trunk Assessment Cervical / Trunk Assessment: Kyphotic  Communication   Communication Communication: No apparent difficulties  Cognition Arousal:  Alert Behavior During Therapy: WFL for tasks assessed/performed Overall Cognitive Status: Within Functional Limits for tasks assessed                                          General Comments General comments (skin integrity, edema, etc.): SpO2 90% with ambulation on 6L O2 with moderate dyspnea. HR and BP stable, neice in the room and supportive; issued incentive spirometer and flutter valve and educated in use pt performing 5 of each with IS up to [id number]    Exercises     Assessment/Plan    PT Assessment Patient needs continued PT services  PT Problem List Decreased strength;Decreased activity tolerance;Decreased balance;Decreased mobility;Cardiopulmonary status limiting activity       PT Treatment Interventions DME instruction;Gait training;Patient/family education;Stair training;Functional mobility training;Therapeutic activities;Therapeutic exercise;Balance training    PT Goals (Current goals can be found in the Care Plan section)  Acute Rehab PT Goals Patient Stated Goal: To return home; independent PT Goal Formulation: With patient/family Time For Goal Achievement: 08/07/23 Potential to Achieve Goals: Good    Frequency Min 1X/week     Co-evaluation               AM-PAC PT "6 Clicks" Mobility  Outcome Measure Help needed turning from your back to your side while in a flat bed without using bedrails?: A Little Help needed moving from lying on your back to sitting on the side of a flat bed without using bedrails?: A Little Help needed moving to and from a bed to a chair (including a wheelchair)?: A Little Help needed standing up from a chair using your arms (e.g., wheelchair or bedside chair)?: A Little Help needed to walk in hospital room?: A Little Help needed climbing 3-5 steps with a railing? : Total 6 Click Score: 16    End of Session Equipment Utilized During Treatment: Gait belt;Oxygen   Patient left: in bed;with family/visitor  present;with call bell/phone within reach   PT Visit Diagnosis: Other abnormalities of gait and mobility (R26.89);Difficulty in walking, not elsewhere classified (R26.2)    Time: 8657-8469 PT Time Calculation (min) (ACUTE ONLY): 31 min   Charges:   PT Evaluation $PT Eval Moderate Complexity: 1 Mod PT Treatments $Gait Training: 8-22 mins PT General Charges $$ ACUTE PT VISIT: 1 Visit         Sheran Lawless, PT Acute Rehabilitation Services [phone] 07/24/2023   Elray Mcgregor 07/24/2023, 6:27 PM

## 2023-07-24 NOTE — Progress Notes (Signed)
ANTICOAGULATION CONSULT NOTE  Pharmacy Consult for heparin Indication: DVT and PE  Allergies  Allergen Reactions   Simvastatin Cough    Patient Measurements: Height: 5\' 2"  (157.5 cm) Weight: 81.9 kg (180 lb 8.9 oz) IBW/kg (Calculated) : 50.1 Heparin Dosing Weight: 78kg  Vital Signs: Temp: 98.3 F (36.8 C) (08/26 0748) Temp Source: Oral (08/26 0748) BP: 175/79 (08/26 0748) Pulse Rate: 69 (08/26 0748)  Labs: Recent Labs    07/22/23 1108 07/22/23 1305 07/23/23 0102 07/23/23 0422 07/23/23 1146 07/23/23 2039  HGB 10.5*  --   --  8.9*  --   --   HCT 36.0  --   --  28.6*  --   --   PLT 174  --   --  195  --   --   HEPARINUNFRC  --   --  0.87*  --  0.66 0.50  CREATININE 2.86*  --   --  2.71*  --   --   TROPONINIHS 67* 78*  --   --   --   --     Estimated Creatinine Clearance: 15.9 mL/min (A) (by C-G formula based on SCr of 2.71 mg/dL (H)).  Assessment: 82 y.o. female with DVT and PE confirmed by VQ scan on heparin. Not on anticoagulation prior to admission.  8/26 AM: Morning heparin level remains therapeutic with heparin running at 1150 units/hour. Hgb 9.5 (stable), PLT 198 (stable). No signs of bleeding or issues with heparin infusion noted.   Goal of Therapy:  Heparin level 0.3-0.7 units/ml Monitor platelets by anticoagulation protocol: Yes   Plan:  Continue heparin gtt at 1150 units/hr Daily heparin level, CBC, s/s bleeding  Jani Gravel, PharmD Clinical Pharmacist  07/24/2023 7:55 AM

## 2023-07-24 NOTE — Progress Notes (Signed)
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  Workup revealed right lower extremity DVT for which she was started on heparin drip.  VQ scan was ordered for suspected PE.  She was also found to have left upper lobe pneumonia along with acute kidney injury.  She was started on IV fluids and antibiotics.  Nephrology was consulted.  Assessment & Plan:   Possible right upper lobe acute PE Acute right lower extremity DVT -Presented with worsening shortness of breath and was found to have right lower extremity DVT.  Currently on heparin drip.  Continue heparin drip for 1 more day.  VQ scan showed possible right upper lobe acute PE.  -2D echo showed EF of 50 to 55% with grade 1 diastolic dysfunction.  Probable community-acquired left upper lobar pneumonia Acute respiratory failure with hypoxia -Continue Rocephin and doxycycline. Respiratory virus panel and COVID-19 negative. -Currently on 6 L oxygen via nasal cannula.  Wean off as able.  Acute kidney injury on CKD stage IIIb Acute metabolic acidosis History of right radical nephrectomy on 03/30/2023 -Baseline creatinine of 1.5 from April 2024.  Creatinine 1.8 on the day of discharge after nephrectomy. -Creatinine 2.86 on presentation.  Improving to 2.71 on 07/23/2023.  Labs pending for today.  Nephrology following.  IV fluids as per nephrology recommendations -Currently on oral sodium bicarbonate tablets as per nephrology.  Essential hypertension -Monitor blood pressure.  Continue labetalol.  Amlodipine was discontinued as an outpatient  Hyperlipidemia -Continue statin  Microcytic anemia -Questionable cause.  Hemoglobin stable.  Monitor  intermittently  Mildly positive troponin -Most likely from demand ischemia.  No chest pain.  2D echo as above.  DC aspirin as patient is on heparin drip as well  Obesity -Outpatient follow-up   DVT prophylaxis: Heparin drip Code Status: Full Family Communication: Niece at bedside Disposition Plan: Status is: Inpatient Remains inpatient appropriate because: Of severity of illness    Consultants: Nephrology  Procedures: None  Antimicrobials: Rocephin and doxycycline from 07/22/2023 onwards   Subjective: Patient seen and examined at bedside.  No fever, vomiting, chest pain reported.  Still short of breath with exertion and continues to have cough. Objective: Vitals:   07/23/23 1548 07/23/23 2032 07/23/23 2300 07/24/23 0748  BP: (!) 176/76 (!) 153/81 (!) 161/72 (!) 175/79  Pulse: 81  72 69  Resp: 20  20 18   Temp: 97.7 F (36.5 C) 98.2 F (36.8 C) 98.1 F (36.7 C) 98.3 F (36.8 C)  TempSrc: Oral Oral Oral Oral  SpO2: 99% 92% 90% 95%  Weight:      Height:        Intake/Output Summary (Last 24 hours) at 07/24/2023 4132 Last data filed at 07/24/2023 0500 Gross per 24 hour  Intake 2008.35 ml  Output 600 ml  Net 1408.35 ml   Filed Weights   07/22/23 1924 07/23/23 0709  Weight: 82.8 kg 81.9 kg    Examination:  General: On 6 L oxygen via nasal cannula.  No distress.  Elderly female lying in bed. ENT/neck: No thyromegaly.  JVD is not elevated  respiratory: Decreased breath sounds at bases bilaterally with some crackles; no wheezing  CVS: S1-S2 heard, rate controlled Abdominal: Soft, nontender, slightly distended; no organomegaly, normal bowel sounds are  heard Extremities: Trace lower extremity edema; no cyanosis  CNS: Awake and alert.  No focal neurologic deficit.  Moves extremities Lymph: No obvious lymphadenopathy Skin: No obvious ecchymosis/lesions  psych: Flat affect.  Not agitated musculoskeletal: No obvious joint swelling/deformity     Data Reviewed: I  have personally reviewed following labs and imaging studies  CBC: Recent Labs  Lab 07/22/23 1108 07/23/23 0422  WBC 8.8 7.5  NEUTROABS 6.6  --   HGB 10.5* 8.9*  HCT 36.0 28.6*  MCV 77.4* 71.9*  PLT 174 195   Basic Metabolic Panel: Recent Labs  Lab 07/22/23 1108 07/23/23 0422  NA 137 137  K 4.6 4.4  CL 110 112*  CO2 16* 17*  GLUCOSE 105* 98  BUN 50* 51*  CREATININE 2.86* 2.71*  CALCIUM 9.6 8.7*   GFR: Estimated Creatinine Clearance: 15.9 mL/min (A) (by C-G formula based on SCr of 2.71 mg/dL (H)). Liver Function Tests: Recent Labs  Lab 07/22/23 1108 07/23/23 0422  AST 22 17  ALT 15 14  ALKPHOS 82 70  BILITOT 0.9 0.8  PROT 7.5 6.2*  ALBUMIN 3.4* 2.8*   No results for input(s): "LIPASE", "AMYLASE" in the last 168 hours. No results for input(s): "AMMONIA" in the last 168 hours. Coagulation Profile: No results for input(s): "INR", "PROTIME" in the last 168 hours. Cardiac Enzymes: No results for input(s): "CKTOTAL", "CKMB", "CKMBINDEX", "TROPONINI" in the last 168 hours. BNP (last 3 results) No results for input(s): "PROBNP" in the last 8760 hours. HbA1C: No results for input(s): "HGBA1C" in the last 72 hours. CBG: Recent Labs  Lab 07/23/23 0616 07/23/23 1147 07/23/23 1558 07/23/23 2105 07/24/23 0634  GLUCAP 96 83 131* 124* 108*   Lipid Profile: No results for input(s): "CHOL", "HDL", "LDLCALC", "TRIG", "CHOLHDL", "LDLDIRECT" in the last 72 hours. Thyroid Function Tests: No results for input(s): "TSH", "T4TOTAL", "FREET4", "T3FREE", "THYROIDAB" in the last 72 hours. Anemia Panel: No results for input(s): "VITAMINB12", "FOLATE", "FERRITIN", "TIBC", "IRON", "RETICCTPCT" in the last 72 hours. Sepsis Labs: No results for input(s): "PROCALCITON", "LATICACIDVEN" in the last 168 hours.  Recent Results (from the past 240 hour(s))  SARS Coronavirus 2 by RT PCR (hospital order, performed in [facility] hospital lab) *cepheid single result test* Anterior Nasal  Swab     Status: None   Collection Time: 07/22/23 11:20 AM   Specimen: Anterior Nasal Swab  Result Value Ref Range Status   SARS Coronavirus 2 by RT PCR NEGATIVE NEGATIVE Final    Comment: Performed at [facility], [address] [place], [place] [address]  Respiratory (~20 pathogens) panel by PCR     Status: None   Collection Time: 07/22/23 11:20 AM   Specimen: Nasopharyngeal Swab; Respiratory  Result Value Ref Range Status   Adenovirus NOT DETECTED NOT DETECTED Final   Coronavirus 229E NOT DETECTED NOT DETECTED Final    Comment: (NOTE) The Coronavirus on the Respiratory Panel, DOES NOT test for the novel  Coronavirus (2019 nCoV)    Coronavirus HKU1 NOT DETECTED NOT DETECTED Final   Coronavirus NL63 NOT DETECTED NOT DETECTED Final   Coronavirus OC43 NOT DETECTED NOT DETECTED Final   Metapneumovirus NOT DETECTED NOT DETECTED Final   Rhinovirus / Enterovirus NOT DETECTED NOT DETECTED Final   Influenza A NOT DETECTED NOT DETECTED Final   Influenza B NOT DETECTED NOT DETECTED Final   Parainfluenza Virus 1 NOT DETECTED NOT DETECTED Final   Parainfluenza Virus 2 NOT DETECTED NOT DETECTED Final   Parainfluenza Virus 3 NOT  DETECTED NOT DETECTED Final   Parainfluenza Virus 4 NOT DETECTED NOT DETECTED Final   Respiratory Syncytial Virus NOT DETECTED NOT DETECTED Final   Bordetella pertussis NOT DETECTED NOT DETECTED Final   Bordetella Parapertussis NOT DETECTED NOT DETECTED Final   Chlamydophila pneumoniae NOT DETECTED NOT DETECTED Final   Mycoplasma pneumoniae NOT DETECTED NOT DETECTED Final    Comment: Performed at [facility], [address], [place], [place] [address]      Scheduled Meds:  aspirin EC  81 mg Oral Daily   atorvastatin  20 mg Oral QHS   cholecalciferol  2,000 Units Oral Daily   insulin aspart  0-5 Units Subcutaneous QHS   insulin aspart  0-9 Units Subcutaneous TID WC   labetalol  200 mg Oral BID   sodium bicarbonate  650 mg Oral TID   Continuous  Infusions:  sodium chloride 100 mL/hr at 07/23/23 1844   cefTRIAXone (ROCEPHIN)  IV Stopped (07/23/23 1655)   doxycycline (VIBRAMYCIN) IV 100 mg (07/24/23 0659)   heparin 1,150 Units/hr (07/23/23 1844)          Glade Lloyd, MD [facility] 07/24/2023, 8:08 AM

## 2023-07-24 NOTE — Progress Notes (Signed)
[facility] NEPHROLOGY PROGRESS NOTE  Assessment/ Plan: Pt is a 82 y.o. yo female with with past medical history significant for type II DM, HLD, RCC status post right radical nephrectomy in 03/2023, solitary left kidney with CKD follows at [facility] with me, presented with shortness of breath found to have DVT, PE and pneumonia.  # Acute kidney injury on CKD 3b: The baseline creatinine level used to be around 1.5 before nephrectomy however the creatinine level elevated to around 1.8 after right nephrectomy in 03/2023.  Recent US renal with no hydronephrosis and left kidney. Serum creatinine level is trending down.  No IV fluid. Daily lab, a strict ins and out and expect further renal recovery.  No need for dialysis.  # Non-anion gap metabolic acidosis: Increase sodium bicarbonate dose.  # Acute PE, acute right lower extremity DVT: Currently on heparin drip.  Echo done.  #Acute hypoxic respiratory failure probably due to PE and pneumonia.  Chest x-ray with lung opacities likely pneumonia.  I would not recommend diuretics.  # HTN/volume: No IV fluid.  On labetalol.  Monitor BP.  Minute to add amlodipine if BP remains elevated.  Avoid hypotensive episode.  Discussed with the primary team and patient's niece.  Subjective: Seen and examined bedside.  Urine output is recorded around 600 cc.  Having shortness of breath and requiring oxygen supplementation.  Denies chest pain, nausea or vomiting.  Her niece was presented with her. Objective Vital signs in last 24 hours: Vitals:   07/23/23 1548 07/23/23 2032 07/23/23 2300 07/24/23 0748  BP: (!) 176/76 (!) 153/81 (!) 161/72 (!) 175/79  Pulse: 81  72 69  Resp: 20  20 18   Temp: 97.7 F (36.5 C) 98.2 F (36.8 C) 98.1 F (36.7 C) 98.3 F (36.8 C)  TempSrc: Oral Oral Oral Oral  SpO2: 99% 92% 90% 95%  Weight:      Height:       Weight change: -0.9 kg  Intake/Output Summary (Last 24 hours) at 07/24/2023 1022 Last data filed at  07/24/2023 0500 Gross per 24 hour  Intake 2008.35 ml  Output 600 ml  Net 1408.35 ml       Labs: RENAL PANEL Recent Labs  Lab 07/22/23 1108 07/23/23 0422 07/24/23 0758  NA 137 137 137  K 4.6 4.4 4.3  CL 110 112* 113*  CO2 16* 17* 15*  GLUCOSE 105* 98 101*  BUN 50* 51* 38*  CREATININE 2.86* 2.71* 2.36*  CALCIUM 9.6 8.7* 9.0  MG  --   --  1.7  PHOS  --   --  3.0  ALBUMIN 3.4* 2.8* 2.8*    Liver Function Tests: Recent Labs  Lab 07/22/23 1108 07/23/23 0422 07/24/23 0758  AST 22 17  --   ALT 15 14  --   ALKPHOS 82 70  --   BILITOT 0.9 0.8  --   PROT 7.5 6.2*  --   ALBUMIN 3.4* 2.8* 2.8*   No results for input(s): "LIPASE", "AMYLASE" in the last 168 hours. No results for input(s): "AMMONIA" in the last 168 hours. CBC: Recent Labs    03/29/23 1523 03/30/23 0530 07/22/23 1108 07/23/23 0422 07/24/23 0758  HGB 11.2* 10.8* 10.5* 8.9* 9.5*  MCV  --   --  77.4* 71.9* 73.1*    Cardiac Enzymes: No results for input(s): "CKTOTAL", "CKMB", "CKMBINDEX", "TROPONINI" in the last 168 hours. CBG: Recent Labs  Lab 07/23/23 0616 07/23/23 1147 07/23/23 1558 07/23/23 2105 07/24/23 0634  GLUCAP 96 83  131* 124* 108*    Iron Studies: No results for input(s): "IRON", "TIBC", "TRANSFERRIN", "FERRITIN" in the last 72 hours. Studies/Results: DG CHEST PORT 1 VIEW  Result Date: 07/23/2023 CLINICAL DATA:  Respiratory distress.  Cough. EXAM: PORTABLE CHEST 1 VIEW COMPARISON:  Radiograph and CT yesterday FINDINGS: Progressive left upper lobe opacity from yesterday. No new airspace disease. Normal heart size with stable mediastinal contours. No pneumothorax or developing pleural effusion. IMPRESSION: Progressive left upper lobe opacity from yesterday, typical of pneumonia. Electronically Signed   By: Narda Rutherford M.D.   On: 07/23/2023 21:15   ECHOCARDIOGRAM COMPLETE  Result Date: 07/23/2023    ECHOCARDIOGRAM REPORT   Patient Name:   [facility] Date of Exam: 07/23/2023 Medical  Rec #:  [id number]   Height:       62.0 in Accession #:    [id number]  Weight:       180.6 lb Date of Birth:  03-Dec-1940   BSA:          1.830 m Patient Age:    82 years    BP:           138/69 mmHg Patient Gender: F           HR:           74 bpm. Exam Location:  Inpatient Procedure: 3D Echo, 2D Echo, Cardiac Doppler and Color Doppler Indications:    Elevated Troponin  History:        Patient has no prior history of Echocardiogram examinations.                 Cardiomyopathy, P.E., Signs/Symptoms:Shortness of Breath; Risk                 Factors:Hypertension, Diabetes, Dyslipidemia, Former Smoker and                 Sleep Apnea.  Sonographer:    Aron Baba Referring Phys: [id number] ANDREW C CORE  Sonographer Comments: Image acquisition challenging due to respiratory motion. IMPRESSIONS  1. Left ventricular ejection fraction, by estimation, is 50 to 55%. The left ventricle has low normal function. The left ventricle has no regional wall motion abnormalities. Left ventricular diastolic parameters are consistent with Grade I diastolic dysfunction (impaired relaxation).  2. Right ventricular systolic function is normal. The right ventricular size is normal. There is moderately elevated pulmonary artery systolic pressure. The estimated right ventricular systolic pressure is 54.3 mmHg.  3. Left atrial size was mildly dilated.  4. The mitral valve is normal in structure. No evidence of mitral valve regurgitation. No evidence of mitral stenosis.  5. Tricuspid valve regurgitation is mild to moderate.  6. The aortic valve was not well visualized. Aortic valve regurgitation is not visualized. Aortic valve sclerosis is present, with no evidence of aortic valve stenosis.  7. The inferior vena cava is normal in size with greater than 50% respiratory variability, suggesting right atrial pressure of 3 mmHg. Comparison(s): No prior Echocardiogram. FINDINGS  Left Ventricle: Left ventricular ejection fraction, by estimation, is 50 to  55%. The left ventricle has low normal function. The left ventricle has no regional wall motion abnormalities. The left ventricular internal cavity size was normal in size. There is no left ventricular hypertrophy. Left ventricular diastolic parameters are consistent with Grade I diastolic dysfunction (impaired relaxation). Right Ventricle: The right ventricular size is normal. No increase in right ventricular wall thickness. Right ventricular systolic function is normal. There is moderately elevated pulmonary artery systolic pressure.  The tricuspid regurgitant velocity is 3.58 m/s, and with an assumed right atrial pressure of 3 mmHg, the estimated right ventricular systolic pressure is 54.3 mmHg. Left Atrium: Left atrial size was mildly dilated. Right Atrium: Right atrial size was normal in size. Pericardium: There is no evidence of pericardial effusion. Mitral Valve: The mitral valve is normal in structure. No evidence of mitral valve regurgitation. No evidence of mitral valve stenosis. Tricuspid Valve: The tricuspid valve is normal in structure. Tricuspid valve regurgitation is mild to moderate. Aortic Valve: The aortic valve was not well visualized. Aortic valve regurgitation is not visualized. Aortic valve sclerosis is present, with no evidence of aortic valve stenosis. Pulmonic Valve: The pulmonic valve was not well visualized. Pulmonic valve regurgitation is not visualized. Aorta: The aortic root and ascending aorta are structurally normal, with no evidence of dilitation. Venous: The inferior vena cava is normal in size with greater than 50% respiratory variability, suggesting right atrial pressure of 3 mmHg. IAS/Shunts: No atrial level shunt detected by color flow Doppler.  LEFT VENTRICLE PLAX 2D LVIDd:         4.00 cm   Diastology LVIDs:         2.90 cm   LV e' medial:    4.21 cm/s LV PW:         0.90 cm   LV E/e' medial:  12.3 LV IVS:        0.80 cm   LV e' lateral:   5.43 cm/s LVOT diam:     1.80 cm   LV  E/e' lateral: 9.5 LV SV:         54 LV SV Index:   29 LVOT Area:     2.54 cm                           3D Volume EF:                          3D EF:        54 %                          LV EDV:       87 ml                          LV ESV:       41 ml                          LV SV:        47 ml RIGHT VENTRICLE RV S prime:     12.10 cm/s TAPSE (M-mode): 1.6 cm LEFT ATRIUM           Index        RIGHT ATRIUM           Index LA diam:      3.90 cm 2.13 cm/m   RA Area:     10.80 cm LA Vol (A2C): 39.2 ml 21.42 ml/m  RA Volume:   18.80 ml  10.27 ml/m LA Vol (A4C): 51.7 ml 28.25 ml/m  AORTIC VALVE LVOT Vmax:   102.00 cm/s LVOT Vmean:  66.000 cm/s LVOT VTI:    0.212 m  AORTA Ao Root diam: 3.10 cm Ao Asc diam:  3.10 cm MITRAL VALVE  TRICUSPID VALVE MV Area (PHT): 3.27 cm    TR Peak grad:   51.3 mmHg MV Decel Time: 232 msec    TR Vmax:        358.00 cm/s MR Peak grad: 5.8 mmHg MR Vmax:      120.00 cm/s  SHUNTS MV E velocity: 51.70 cm/s  Systemic VTI:  0.21 m MV A velocity: 66.50 cm/s  Systemic Diam: 1.80 cm MV E/A ratio:  0.78 Riley Lam MD Electronically signed by Riley Lam MD Signature Date/Time: 07/23/2023/3:33:20 PM    Final    NM Pulmonary Perfusion  Result Date: 07/23/2023 CLINICAL DATA:  Dyspnea for 3 weeks. EXAM: NUCLEAR MEDICINE PERFUSION LUNG SCAN TECHNIQUE: Perfusion images were obtained in multiple projections after intravenous injection of radiopharmaceutical. Ventilation scans intentionally deferred if perfusion scan and chest x-ray adequate for interpretation during COVID 19 epidemic. RADIOPHARMACEUTICALS:  4.2 mCi Tc-53m MAA IV COMPARISON:  One-view chest x-ray and CT chest without contrast 07/22/2023 FINDINGS: A wedge-shaped filling defect corresponds to the anterior segment of the right upper lobe. No CT or chest x-ray correlate is present. A less well-defined defect in the left upper lobe corresponds to the documented airspace disease. IMPRESSION: Positive for  pulmonary embolus in the right upper lobe. These results will be called to the ordering clinician or representative by the Radiologist Assistant, and communication documented in the PACS or [employer]. Electronically Signed   By: Marin Roberts M.D.   On: 07/23/2023 10:46   VAS [place] LOWER EXTREMITY VENOUS (DVT) (ONLY [facility] & [facility])  Result Date: 07/23/2023  Lower Venous DVT Study Patient Name:  Lucella [facility]  Date of Exam:   07/22/2023 Medical Rec #: [id number]    Accession #:    [id number] Date of Birth: 1941-04-03    Patient Gender: F Patient Age:   54 years Exam Location:  [facility] Procedure:      VAS [place] LOWER EXTREMITY VENOUS (DVT) Referring Phys: Arabella Merles --------------------------------------------------------------------------------  Indications: SOB.  Comparison Study: No prior study on file Performing Technologist: Sherren Kerns RVS  Examination Guidelines: A complete evaluation includes B-mode imaging, spectral Doppler, color Doppler, and power Doppler as needed of all accessible portions of each vessel. Bilateral testing is considered an integral part of a complete examination. Limited examinations for reoccurring indications may be performed as noted. The reflux portion of the exam is performed with the patient in reverse Trendelenburg.  +---------+---------------+---------+-----------+----------+--------------+ RIGHT    CompressibilityPhasicitySpontaneityPropertiesThrombus Aging +---------+---------------+---------+-----------+----------+--------------+ CFV      Full           Yes      Yes                                 +---------+---------------+---------+-----------+----------+--------------+ SFJ      Full                                                        +---------+---------------+---------+-----------+----------+--------------+ FV Prox  Full                                                         +---------+---------------+---------+-----------+----------+--------------+  FV Mid   Full                                                        +---------+---------------+---------+-----------+----------+--------------+ FV DistalFull                                                        +---------+---------------+---------+-----------+----------+--------------+ PFV      Full                                                        +---------+---------------+---------+-----------+----------+--------------+ POP      Full           Yes      Yes                                 +---------+---------------+---------+-----------+----------+--------------+ PTV      None                                         Acute          +---------+---------------+---------+-----------+----------+--------------+ PERO     None                                         Acute          +---------+---------------+---------+-----------+----------+--------------+   +---------+---------------+---------+-----------+----------+--------------+ LEFT     CompressibilityPhasicitySpontaneityPropertiesThrombus Aging +---------+---------------+---------+-----------+----------+--------------+ CFV      Full           Yes      Yes                                 +---------+---------------+---------+-----------+----------+--------------+ SFJ      Full                                                        +---------+---------------+---------+-----------+----------+--------------+ FV Prox  Full                                                        +---------+---------------+---------+-----------+----------+--------------+ FV Mid   Full                                                        +---------+---------------+---------+-----------+----------+--------------+  FV DistalFull                                                         +---------+---------------+---------+-----------+----------+--------------+ PFV      Full                                                        +---------+---------------+---------+-----------+----------+--------------+ POP      Full           Yes      Yes                                 +---------+---------------+---------+-----------+----------+--------------+ PTV      Full                                                        +---------+---------------+---------+-----------+----------+--------------+ PERO     Full                                                        +---------+---------------+---------+-----------+----------+--------------+     Summary: BILATERAL: -No evidence of popliteal cyst, bilaterally. RIGHT: - Findings consistent with acute deep vein thrombosis involving the right posterior tibial veins, and right peroneal veins.   LEFT: - There is no evidence of deep vein thrombosis in the lower extremity.  *See table(s) above for measurements and observations. Electronically signed by Coral Else MD on 07/23/2023 at 9:19:48 AM.    Final    US RENAL  Result Date: 07/22/2023 CLINICAL DATA:  Acute renal insufficiency.  Renal cell carcinoma. EXAM: RENAL / URINARY TRACT ULTRASOUND COMPLETE COMPARISON:  Renal ultrasound dated 10/04/2022 and abdominal MRI dated 02/27/2023. FINDINGS: Right Kidney: Nephrectomy. Left Kidney: Renal measurements: 10.2 x 4.7 x 6.1 cm = volume: 15 mL. Normal echogenicity. No hydronephrosis or shadowing stone. Multiple cysts as seen on the MRI measure up to 3.1 cm in the lower pole. Bladder: Appears normal for degree of bladder distention. Other: None. IMPRESSION: 1. Right nephrectomy. 2. Multiple left renal cysts.  No hydronephrosis or shadowing stone. 3. Unremarkable urinary bladder. Electronically Signed   By: Elgie Collard M.D.   On: 07/22/2023 19:06   CT Chest Wo Contrast  Result Date: 07/22/2023 CLINICAL DATA:  PE suspected versus  pneumonia EXAM: CT CHEST WITHOUT CONTRAST TECHNIQUE: Multidetector CT imaging of the chest was performed following the standard protocol without IV contrast. RADIATION DOSE REDUCTION: This exam was performed according to the departmental dose-optimization program which includes automated exposure control, adjustment of the mA and/or kV according to patient size and/or use of iterative reconstruction technique. COMPARISON:  None Available. FINDINGS: Cardiovascular: Aortic atherosclerosis. Normal heart size. Enlargement of the main pulmonary artery measuring up to 3.7 cm in caliber. Three-vessel coronary artery calcifications. No pericardial effusion. Mediastinum/Nodes:  No enlarged mediastinal, hilar, or axillary lymph nodes. Small hiatal hernia. Thyroid gland, trachea, and esophagus demonstrate no significant findings. Lungs/Pleura: Heterogeneous and consolidative airspace disease throughout the left upper lobe (series 4, image 28). No pleural effusion or pneumothorax. Upper Abdomen: No acute abnormality. Partially imaged right nephrectomy. Musculoskeletal: No chest wall abnormality. No acute osseous findings. IMPRESSION: 1. Heterogeneous and consolidative airspace disease throughout the left upper lobe, consistent with pneumonia. Recommend follow-up to resolution to exclude underlying neoplasm. 2. No meaningful evaluation for pulmonary embolism on this noncontrast examination. 3. Enlargement of the main pulmonary artery, as can be seen in pulmonary hypertension. 4. Coronary artery disease. Aortic Atherosclerosis (ICD10-I70.0). Electronically Signed   By: Jearld Lesch M.D.   On: 07/22/2023 16:37   DG Chest Portable 1 View  Result Date: 07/22/2023 CLINICAL DATA:  Dyspnea for 3 weeks EXAM: PORTABLE CHEST 1 VIEW COMPARISON:  None Available. FINDINGS: Consolidative left upper lobe opacity. No pneumothorax, effusion or edema. Normal cardiopericardial silhouette. Calcified aorta. Right shoulder reverse arthroplasty.  Overlapping cardiac leads. IMPRESSION: Left upper lung consolidative opacity. Possible pneumonia. However recommend follow-up to confirm resolution. Electronically Signed   By: Karen Kays M.D.   On: 07/22/2023 12:49    Medications: Infusions:  cefTRIAXone (ROCEPHIN)  IV Stopped (07/23/23 1655)   doxycycline (VIBRAMYCIN) IV 100 mg (07/24/23 0659)   heparin 1,150 Units/hr (07/24/23 0817)    Scheduled Medications:  aspirin EC  81 mg Oral Daily   atorvastatin  20 mg Oral QHS   cholecalciferol  2,000 Units Oral Daily   insulin aspart  0-5 Units Subcutaneous QHS   insulin aspart  0-9 Units Subcutaneous TID WC   labetalol  200 mg Oral BID   sodium bicarbonate  650 mg Oral TID    have reviewed scheduled and prn medications.  Physical Exam: General:NAD, comfortable Heart:RRR, s1s2 nl Lungs: Some crackles and rhonchi.  No wheezing. Abdomen:soft, Non-tender, non-distended Extremities:No edema Neurology: Alert, awake and following commands  Destanee Bedonie Jaynie Collins 07/24/2023,10:22 AM  LOS: 2 days

## 2023-07-25 DIAGNOSIS — I824Y1 Acute embolism and thrombosis of unspecified deep veins of right proximal lower extremity: Secondary | ICD-10-CM | POA: Diagnosis not present

## 2023-07-25 LAB — GLUCOSE, CAPILLARY
Glucose-Capillary: 88 mg/dL (ref 70–99)
Glucose-Capillary: 124 mg/dL — ABNORMAL HIGH (ref 70–99)
Glucose-Capillary: 101 mg/dL — ABNORMAL HIGH (ref 70–99)
Glucose-Capillary: 108 mg/dL — ABNORMAL HIGH (ref 70–99)

## 2023-07-25 LAB — CBC
HCT: 27.9 % — ABNORMAL LOW (ref 36.0–46.0)
Hemoglobin: 8.5 g/dL — ABNORMAL LOW (ref 12.0–15.0)
MCH: 22.5 pg — ABNORMAL LOW (ref 26.0–34.0)
MCHC: 30.5 g/dL (ref 30.0–36.0)
MCV: 74 fL — ABNORMAL LOW (ref 80.0–100.0)
Platelets: 174 10*3/uL (ref 150–400)
RBC: 3.77 MIL/uL — ABNORMAL LOW (ref 3.87–5.11)
RDW: 17.1 % — ABNORMAL HIGH (ref 11.5–15.5)
WBC: 6.5 10*3/uL (ref 4.0–10.5)
nRBC: 0 % (ref 0.0–0.2)

## 2023-07-25 LAB — HEPARIN LEVEL (UNFRACTIONATED): Heparin Unfractionated: 0.54 [IU]/mL (ref 0.30–0.70)

## 2023-07-25 LAB — RENAL FUNCTION PANEL
Albumin: 2.5 g/dL — ABNORMAL LOW (ref 3.5–5.0)
Anion gap: 9 (ref 5–15)
BUN: 33 mg/dL — ABNORMAL HIGH (ref 8–23)
CO2: 18 mmol/L — ABNORMAL LOW (ref 22–32)
Calcium: 9.1 mg/dL (ref 8.9–10.3)
Chloride: 113 mmol/L — ABNORMAL HIGH (ref 98–111)
Creatinine, Ser: 2.46 mg/dL — ABNORMAL HIGH (ref 0.44–1.00)
GFR, Estimated: 19 mL/min — ABNORMAL LOW (ref >60)
Glucose, Bld: 93 mg/dL (ref 70–99)
Phosphorus: 3.6 mg/dL (ref 2.5–4.6)
Potassium: 4.5 mmol/L (ref 3.5–5.1)
Sodium: 140 mmol/L (ref 135–145)

## 2023-07-25 LAB — MAGNESIUM: Magnesium: 1.6 mg/dL — ABNORMAL LOW (ref 1.7–2.4)

## 2023-07-25 MED ORDER — SALINE SPRAY 0.65 % NA SOLN
1.0000 | NASAL | Status: DC | PRN
  Administered 2023-07-26: 1 via NASAL
  Filled 2023-07-25: qty 44

## 2023-07-25 MED ORDER — APIXABAN 5 MG PO TABS: 5.0000 mg | ORAL_TABLET | Freq: Two times a day (BID) | ORAL | Status: DC

## 2023-07-25 MED ORDER — APIXABAN 5 MG PO TABS
10.0000 mg | ORAL_TABLET | Freq: Two times a day (BID) | ORAL | Status: DC
  Administered 2023-07-25 (×2): 10 mg via ORAL
  Filled 2023-07-25 (×3): qty 2

## 2023-07-25 MED ORDER — MAGNESIUM SULFATE 2 GM/50ML IV SOLN
2.0000 g | Freq: Once | INTRAVENOUS | Status: AC
  Administered 2023-07-25: 2 g via INTRAVENOUS
  Filled 2023-07-25: qty 50

## 2023-07-25 NOTE — Progress Notes (Signed)
[facility] NEPHROLOGY PROGRESS NOTE  Assessment/ Plan: Pt is a 82 y.o. yo female with with past medical history significant for type II DM, HLD, RCC status post right radical nephrectomy in 03/2023, solitary left kidney with CKD follows at [facility] with me, presented with shortness of breath found to have DVT, PE and pneumonia.  # Acute kidney injury on CKD 3b: The baseline creatinine level used to be around 1.5 before nephrectomy however the creatinine level elevated to around 1.8 after right nephrectomy in 03/2023.  Recent US renal with no hydronephrosis and left kidney. Urine output is around 400 and it seems like it is not accurately measured.  The creatinine level and GFR are low but is stable.  I expect to stabilize her GFR and I can follow her outpatient.  No need for dialysis. Daily lab, a strict ins and out.  # Non-anion gap metabolic acidosis: Increased sodium bicarbonate dose.  # Acute PE, acute right lower extremity DVT: Currently on heparin drip.  Echo done.  #Acute hypoxic respiratory failure probably due to PE and pneumonia.  Chest x-ray with lung opacities likely pneumonia.  Currently on ceftriaxone and doxycycline.  # HTN/volume: No IV fluid.  On labetalol.  Monitor BP.   Avoid hypotensive episode.  Discussed with the primary team and patient's niece.  I will review the lab results.  Sign off, please call us back with question.  Subjective: Seen and examined bedside.  Urine output is recorded only 400 cc however it seems like her actual urine output is more than this.  Breathing is better today.  Denies nausea, dysgeusia, chest pain.  Her niece was with her today.  Objective Vital signs in last 24 hours: Vitals:   07/24/23 2302 07/25/23 0500 07/25/23 0511 07/25/23 0846  BP: (!) 149/73 (!) 157/73  (!) 155/69  Pulse: 66 62  64  Resp: 20 17  20   Temp: 98.2 F (36.8 C) 98.1 F (36.7 C)  (!) 97.3 F (36.3 C)  TempSrc: Oral Oral  Oral  SpO2: 96% 99%  95%   Weight:   84.4 kg   Height:       Weight change: 2.5 kg  Intake/Output Summary (Last 24 hours) at 07/25/2023 1036 Last data filed at 07/25/2023 1478 Gross per 24 hour  Intake 865.55 ml  Output 400 ml  Net 465.55 ml       Labs: RENAL PANEL Recent Labs  Lab 07/22/23 1108 07/23/23 0422 07/24/23 0758 07/25/23 0633  NA 137 137 137 140  K 4.6 4.4 4.3 4.5  CL 110 112* 113* 113*  CO2 16* 17* 15* 18*  GLUCOSE 105* 98 101* 93  BUN 50* 51* 38* 33*  CREATININE 2.86* 2.71* 2.36* 2.46*  CALCIUM 9.6 8.7* 9.0 9.1  MG  --   --  1.7 1.6*  PHOS  --   --  3.0 3.6  ALBUMIN 3.4* 2.8* 2.8* 2.5*    Liver Function Tests: Recent Labs  Lab 07/22/23 1108 07/23/23 0422 07/24/23 0758 07/25/23 0633  AST 22 17  --   --   ALT 15 14  --   --   ALKPHOS 82 70  --   --   BILITOT 0.9 0.8  --   --   PROT 7.5 6.2*  --   --   ALBUMIN 3.4* 2.8* 2.8* 2.5*   No results for input(s): "LIPASE", "AMYLASE" in the last 168 hours. No results for input(s): "AMMONIA" in the last 168 hours. CBC: Recent Labs  03/30/23 0530 07/22/23 1108 07/23/23 0422 07/24/23 0758 07/25/23 0633  HGB 10.8* 10.5* 8.9* 9.5* 8.5*  MCV  --  77.4* 71.9* 73.1* 74.0*    Cardiac Enzymes: No results for input(s): "CKTOTAL", "CKMB", "CKMBINDEX", "TROPONINI" in the last 168 hours. CBG: Recent Labs  Lab 07/24/23 0634 07/24/23 1127 07/24/23 1620 07/24/23 2121 07/25/23 0553  GLUCAP 108* 102* 102* 129* 88    Iron Studies: No results for input(s): "IRON", "TIBC", "TRANSFERRIN", "FERRITIN" in the last 72 hours. Studies/Results: DG CHEST PORT 1 VIEW  Result Date: 07/23/2023 CLINICAL DATA:  Respiratory distress.  Cough. EXAM: PORTABLE CHEST 1 VIEW COMPARISON:  Radiograph and CT yesterday FINDINGS: Progressive left upper lobe opacity from yesterday. No new airspace disease. Normal heart size with stable mediastinal contours. No pneumothorax or developing pleural effusion. IMPRESSION: Progressive left upper lobe opacity from  yesterday, typical of pneumonia. Electronically Signed   By: Narda Rutherford M.D.   On: 07/23/2023 21:15   ECHOCARDIOGRAM COMPLETE  Result Date: 07/23/2023    ECHOCARDIOGRAM REPORT   Patient Name:   Dana Fleming [facility] Date of Exam: 07/23/2023 Medical Rec #:  [id number]   Height:       62.0 in Accession #:    [id number]  Weight:       180.6 lb Date of Birth:  07-21-1941   BSA:          1.830 m Patient Age:    82 years    BP:           138/69 mmHg Patient Gender: F           HR:           74 bpm. Exam Location:  Inpatient Procedure: 3D Echo, 2D Echo, Cardiac Doppler and Color Doppler Indications:    Elevated Troponin  History:        Patient has no prior history of Echocardiogram examinations.                 Cardiomyopathy, P.E., Signs/Symptoms:Shortness of Breath; Risk                 Factors:Hypertension, Diabetes, Dyslipidemia, Former Smoker and                 Sleep Apnea.  Sonographer:    Aron Baba Referring Phys: [id number] ANDREW C CORE  Sonographer Comments: Image acquisition challenging due to respiratory motion. IMPRESSIONS  1. Left ventricular ejection fraction, by estimation, is 50 to 55%. The left ventricle has low normal function. The left ventricle has no regional wall motion abnormalities. Left ventricular diastolic parameters are consistent with Grade I diastolic dysfunction (impaired relaxation).  2. Right ventricular systolic function is normal. The right ventricular size is normal. There is moderately elevated pulmonary artery systolic pressure. The estimated right ventricular systolic pressure is 54.3 mmHg.  3. Left atrial size was mildly dilated.  4. The mitral valve is normal in structure. No evidence of mitral valve regurgitation. No evidence of mitral stenosis.  5. Tricuspid valve regurgitation is mild to moderate.  6. The aortic valve was not well visualized. Aortic valve regurgitation is not visualized. Aortic valve sclerosis is present, with no evidence of aortic valve stenosis.  7. The  inferior vena cava is normal in size with greater than 50% respiratory variability, suggesting right atrial pressure of 3 mmHg. Comparison(s): No prior Echocardiogram. FINDINGS  Left Ventricle: Left ventricular ejection fraction, by estimation, is 50 to 55%. The left ventricle has low normal function. The left ventricle has  no regional wall motion abnormalities. The left ventricular internal cavity size was normal in size. There is no left ventricular hypertrophy. Left ventricular diastolic parameters are consistent with Grade I diastolic dysfunction (impaired relaxation). Right Ventricle: The right ventricular size is normal. No increase in right ventricular wall thickness. Right ventricular systolic function is normal. There is moderately elevated pulmonary artery systolic pressure. The tricuspid regurgitant velocity is 3.58 m/s, and with an assumed right atrial pressure of 3 mmHg, the estimated right ventricular systolic pressure is 54.3 mmHg. Left Atrium: Left atrial size was mildly dilated. Right Atrium: Right atrial size was normal in size. Pericardium: There is no evidence of pericardial effusion. Mitral Valve: The mitral valve is normal in structure. No evidence of mitral valve regurgitation. No evidence of mitral valve stenosis. Tricuspid Valve: The tricuspid valve is normal in structure. Tricuspid valve regurgitation is mild to moderate. Aortic Valve: The aortic valve was not well visualized. Aortic valve regurgitation is not visualized. Aortic valve sclerosis is present, with no evidence of aortic valve stenosis. Pulmonic Valve: The pulmonic valve was not well visualized. Pulmonic valve regurgitation is not visualized. Aorta: The aortic root and ascending aorta are structurally normal, with no evidence of dilitation. Venous: The inferior vena cava is normal in size with greater than 50% respiratory variability, suggesting right atrial pressure of 3 mmHg. IAS/Shunts: No atrial level shunt detected by color  flow Doppler.  LEFT VENTRICLE PLAX 2D LVIDd:         4.00 cm   Diastology LVIDs:         2.90 cm   LV e' medial:    4.21 cm/s LV PW:         0.90 cm   LV E/e' medial:  12.3 LV IVS:        0.80 cm   LV e' lateral:   5.43 cm/s LVOT diam:     1.80 cm   LV E/e' lateral: 9.5 LV SV:         54 LV SV Index:   29 LVOT Area:     2.54 cm                           3D Volume EF:                          3D EF:        54 %                          LV EDV:       87 ml                          LV ESV:       41 ml                          LV SV:        47 ml RIGHT VENTRICLE RV S prime:     12.10 cm/s TAPSE (M-mode): 1.6 cm LEFT ATRIUM           Index        RIGHT ATRIUM           Index LA diam:      3.90 cm 2.13 cm/m   RA Area:     10.80 cm LA  Vol [facility]): 39.2 ml 21.42 ml/m  RA Volume:   18.80 ml  10.27 ml/m LA Vol (A4C): 51.7 ml 28.25 ml/m  AORTIC VALVE LVOT Vmax:   102.00 cm/s LVOT Vmean:  66.000 cm/s LVOT VTI:    0.212 m  AORTA Ao Root diam: 3.10 cm Ao Asc diam:  3.10 cm MITRAL VALVE               TRICUSPID VALVE MV Area (PHT): 3.27 cm    TR Peak grad:   51.3 mmHg MV Decel Time: 232 msec    TR Vmax:        358.00 cm/s MR Peak grad: 5.8 mmHg MR Vmax:      120.00 cm/s  SHUNTS MV E velocity: 51.70 cm/s  Systemic VTI:  0.21 m MV A velocity: 66.50 cm/s  Systemic Diam: 1.80 cm MV E/A ratio:  0.78 Riley Lam MD Electronically signed by Riley Lam MD Signature Date/Time: 07/23/2023/3:33:20 PM    Final     Medications: Infusions:  cefTRIAXone (ROCEPHIN)  IV Stopped (07/24/23 1722)   doxycycline (VIBRAMYCIN) IV 125 mL/hr at 07/25/23 0649   heparin 1,150 Units/hr (07/25/23 0649)   magnesium sulfate bolus IVPB      Scheduled Medications:  aspirin EC  81 mg Oral Daily   atorvastatin  20 mg Oral QHS   cholecalciferol  2,000 Units Oral Daily   insulin aspart  0-5 Units Subcutaneous QHS   insulin aspart  0-9 Units Subcutaneous TID WC   labetalol  200 mg Oral BID   sodium bicarbonate  1,300 mg Oral BID     have reviewed scheduled and prn medications.  Physical Exam: General:NAD, comfortable Heart:RRR, s1s2 nl Lungs: Some crackles and rhonchi.  No wheezing. Abdomen:soft, Non-tender, non-distended Extremities:No edema Neurology: Alert, awake and following commands  Abie Killian Prasad Amayrani Bennick 07/25/2023,10:36 AM  LOS: 3 days

## 2023-07-25 NOTE — Discharge Instructions (Addendum)
Information on my medicine - ELIQUIS (apixaban)  Why was Eliquis prescribed for you? Eliquis was prescribed to treat blood clots that may have been found in the veins of your legs (deep vein thrombosis) or in your lungs (pulmonary embolism) and to reduce the risk of them occurring again.  What do You need to know about Eliquis ? The starting dose is 10 mg (two 5 mg tablets) taken TWICE daily for the FIRST SEVEN (7) DAYS, then on 08/01/23  the dose is reduced to ONE 5 mg tablet taken TWICE daily.  Eliquis may be taken with or without food.   Try to take the dose about the same time in the morning and in the evening. If you have difficulty swallowing the tablet whole please discuss with your pharmacist how to take the medication safely.  Take Eliquis exactly as prescribed and DO NOT stop taking Eliquis without talking to the doctor who prescribed the medication.  Stopping may increase your risk of developing a new blood clot.  Refill your prescription before you run out.  After discharge, you should have regular check-up appointments with your healthcare provider that is prescribing your Eliquis.    What do you do if you miss a dose? If a dose of ELIQUIS is not taken at the scheduled time, take it as soon as possible on the same day and twice-daily administration should be resumed. The dose should not be doubled to make up for a missed dose.  Important Safety Information A possible side effect of Eliquis is bleeding. You should call your healthcare provider right away if you experience any of the following: Bleeding from an injury or your nose that does not stop. Unusual colored urine (red or dark brown) or unusual colored stools (red or black). Unusual bruising for unknown reasons. A serious fall or if you hit your head (even if there is no bleeding).  Some medicines may interact with Eliquis and might increase your risk of bleeding or clotting while on Eliquis. To help avoid this,  consult your healthcare provider or pharmacist prior to using any new prescription or non-prescription medications, including herbals, vitamins, non-steroidal anti-inflammatory drugs (NSAIDs) and supplements.  This website has more information on Eliquis (apixaban): http://www.eliquis.com/eliquis/home

## 2023-07-25 NOTE — Progress Notes (Signed)
Physical Therapy Treatment Patient Details Name: Dana Fleming MRN: [id number] DOB: 01/15/1941 Today's Date: 07/25/2023   History of Present Illness Patient is an 82 y/o female admitted 07/22/23 with SOB, found to have DVT RLE and acute PE as well as L UL pneumonia and AKI.  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    PT Comments  Patient assisted to contact her niece as had lost her phone.  She was more mobile today and tolerated increased distance with less SOB and no audible crackles today.  She pulled up to 1250 on incentive and reports feeling much better.  PT will continue to follow.  Utilized rollator today and she prefers for home.     If plan is discharge home, recommend the following: A little help with walking and/or transfers;A little help with bathing/dressing/bathroom;Assistance with cooking/housework;Assist for transportation;Help with stairs or ramp for entrance   Can travel by private vehicle        Equipment Recommendations  Rollator (4 wheels)    Recommendations for Other Services       Precautions / Restrictions Precautions Precautions: Fall Precaution Comments: watch O2     Mobility  Bed Mobility   Bed Mobility: Supine to Sit, Sit to Supine     Supine to sit: Supervision Sit to supine: Supervision   General bed mobility comments: assist for lines    Transfers Overall transfer level: Needs assistance Equipment used: Rollator (4 wheels) Transfers: Sit to/from Stand Sit to Stand: Contact guard assist           General transfer comment: to transition from EOB to rollator a little distant due to obstacles    Ambulation/Gait Ambulation/Gait assistance: Contact guard assist Gait Distance (Feet): 200 Feet Assistive device: Rollator (4 wheels) Gait Pattern/deviations: Step-through pattern, Decreased stride length       General Gait Details: slow pace, assist for safety, on O2 throughout   Stairs              Wheelchair Mobility     Tilt Bed    Modified Rankin (Stroke Patients Only)       Balance Overall balance assessment: Needs assistance   Sitting balance-Leahy Scale: Good     Standing balance support: No upper extremity supported Standing balance-Leahy Scale: Fair Standing balance comment: some standing without UE support to doff gown, etc                            Cognition Arousal: Alert Behavior During Therapy: WFL for tasks assessed/performed Overall Cognitive Status: Within Functional Limits for tasks assessed                                          Exercises Other Exercises Other Exercises: incentive spirometer x 5 with cues for technique. pulled up to    General Comments        Pertinent Vitals/Pain Pain Assessment Pain Assessment: No/denies pain    Home Living                          Prior Function            PT Goals (current goals can now be found in the care plan section) Progress towards PT goals: Progressing toward goals    Frequency  Min 1X/week      PT Plan      Co-evaluation              AM-PAC PT "6 Clicks" Mobility   Outcome Measure  Help needed turning from your back to your side while in a flat bed without using bedrails?: None Help needed moving from lying on your back to sitting on the side of a flat bed without using bedrails?: None Help needed moving to and from a bed to a chair (including a wheelchair)?: A Little Help needed standing up from a chair using your arms (e.g., wheelchair or bedside chair)?: A Little Help needed to walk in hospital room?: A Little Help needed climbing 3-5 steps with a railing? : Total 6 Click Score: 18    End of Session Equipment Utilized During Treatment: Gait belt;Oxygen Activity Tolerance: Patient tolerated treatment well Patient left: in bed;with call bell/phone within reach   PT Visit Diagnosis: Other abnormalities of gait  and mobility (R26.89);Difficulty in walking, not elsewhere classified (R26.2)     Time: 4782-9562 PT Time Calculation (min) (ACUTE ONLY): 27 min  Charges:    $Gait Training: 8-22 mins $Therapeutic Activity: 8-22 mins PT General Charges $$ ACUTE PT VISIT: 1 Visit                     Sheran Lawless, PT Acute Rehabilitation Services Office:[phone] 07/25/2023    Dana Fleming 07/25/2023, 4:45 PM

## 2023-07-25 NOTE — Progress Notes (Signed)
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  Workup revealed right lower extremity DVT for which she was started on heparin drip.  VQ scan was ordered for suspected PE.  She was also found to have left upper lobe pneumonia along with acute kidney injury.  She was started on IV fluids and antibiotics.  Nephrology was consulted.  Assessment & Plan:   Possible right upper lobe acute PE Acute right lower extremity DVT -Presented with worsening shortness of breath and was found to have right lower extremity DVT.  Currently on heparin drip.  Will possibly switch to oral Eliquis today.  VQ scan showed possible right upper lobe acute PE.  -2D echo showed EF of 50 to 55% with grade 1 diastolic dysfunction.  Probable community-acquired left upper lobar pneumonia Acute respiratory failure with hypoxia -Continue Rocephin and doxycycline. Respiratory virus panel and COVID-19 negative. -Currently on 5 L oxygen via nasal cannula.  Wean off as able.  Acute kidney injury on CKD stage IIIb Acute metabolic acidosis History of right radical nephrectomy on 03/30/2023 -Baseline creatinine of 1.5 from April 2024.  Creatinine 1.8 on the day of discharge after nephrectomy. -Creatinine 2.86 on presentation.  Improving to 2.46 today.  Nephrology following.  Off IV fluids. -Currently on oral sodium bicarbonate tablets as per nephrology.  Bicarb 18 today.  Monitor.  Hypomagnesemia -Replace.  Repeat a.m. labs  Essential hypertension -Monitor blood pressure.  Continue labetalol.  Amlodipine was discontinued as an outpatient  Hyperlipidemia -Continue statin  Microcytic anemia -Questionable cause.  Hemoglobin  stable.  Monitor intermittently  Mildly positive troponin -Most likely from demand ischemia.  No chest pain.  2D echo as above.  DC aspirin as patient is on heparin drip as well  Physical deconditioning -PT following.  Recommend home health PT  Obesity -Outpatient follow-up   DVT prophylaxis: Heparin drip Code Status: Full Family Communication: Niece at bedside Disposition Plan: Status is: Inpatient Remains inpatient appropriate because: Of severity of illness    Consultants: Nephrology  Procedures: None  Antimicrobials: Rocephin and doxycycline from 07/22/2023 onwards   Subjective: Patient seen and examined at bedside.  Feels slightly better.  Appetite slightly better.  No chest pain.  Still complaining of exertional shortness of breath with intermittent cough.  No fever or vomiting reported. Objective: Vitals:   07/24/23 1941 07/24/23 2302 07/25/23 0500 07/25/23 0511  BP: (!) 171/64 (!) 149/73 (!) 157/73   Pulse: 88 66 62   Resp: 20 20 17    Temp: 98 F (36.7 C) 98.2 F (36.8 C) 98.1 F (36.7 C)   TempSrc: Oral Oral Oral   SpO2: 94% 96% 99%   Weight:    84.4 kg  Height:        Intake/Output Summary (Last 24 hours) at 07/25/2023 0821 Last data filed at 07/25/2023 0649 Gross per 24 hour  Intake 888.43 ml  Output 400 ml  Net 488.43 ml   Filed Weights   07/22/23 1924 07/23/23 0709 07/25/23 0511  Weight: 82.8 kg 81.9 kg 84.4 kg    Examination:  General: No acute distress.  On 5 L oxygen via nasal cannula.  Elderly female lying in bed. ENT/neck: No obvious JVD elevation or palpable neck masses noted respiratory: Bilateral  decreased breath sounds at bases with scattered crackles  CVS: Rate mostly controlled; S1 and S2 are heard Abdominal: Soft, nontender, distended mildly; no organomegaly, bowel sounds normally heard  extremities: No clubbing; mild lower extremity edema present CNS: Alert and oriented.  No focal neurologic deficit.  Able to move  extremities Lymph: No palpable lymphadenopathy Skin: No obvious rashes/petechiae  psych: Mood, affect and judgment are normal.   Musculoskeletal: No obvious joint deformities/tenderness    Data Reviewed: I have personally reviewed following labs and imaging studies  CBC: Recent Labs  Lab 07/22/23 1108 07/23/23 0422 07/24/23 0758 07/25/23 0633  WBC 8.8 7.5 8.2 6.5  NEUTROABS 6.6  --   --   --   HGB 10.5* 8.9* 9.5* 8.5*  HCT 36.0 28.6* 31.0* 27.9*  MCV 77.4* 71.9* 73.1* 74.0*  PLT 174 195 198 174   Basic Metabolic Panel: Recent Labs  Lab 07/22/23 1108 07/23/23 0422 07/24/23 0758 07/25/23 0633  NA 137 137 137 140  K 4.6 4.4 4.3 4.5  CL 110 112* 113* 113*  CO2 16* 17* 15* 18*  GLUCOSE 105* 98 101* 93  BUN 50* 51* 38* 33*  CREATININE 2.86* 2.71* 2.36* 2.46*  CALCIUM 9.6 8.7* 9.0 9.1  MG  --   --  1.7 1.6*  PHOS  --   --  3.0 3.6   GFR: Estimated Creatinine Clearance: 17.8 mL/min (A) (by C-G formula based on SCr of 2.46 mg/dL (H)). Liver Function Tests: Recent Labs  Lab 07/22/23 1108 07/23/23 0422 07/24/23 0758 07/25/23 0633  AST 22 17  --   --   ALT 15 14  --   --   ALKPHOS 82 70  --   --   BILITOT 0.9 0.8  --   --   PROT 7.5 6.2*  --   --   ALBUMIN 3.4* 2.8* 2.8* 2.5*   No results for input(s): "LIPASE", "AMYLASE" in the last 168 hours. No results for input(s): "AMMONIA" in the last 168 hours. Coagulation Profile: No results for input(s): "INR", "PROTIME" in the last 168 hours. Cardiac Enzymes: No results for input(s): "CKTOTAL", "CKMB", "CKMBINDEX", "TROPONINI" in the last 168 hours. BNP (last 3 results) No results for input(s): "PROBNP" in the last 8760 hours. HbA1C: No results for input(s): "HGBA1C" in the last 72 hours. CBG: Recent Labs  Lab 07/24/23 0634 07/24/23 1127 07/24/23 1620 07/24/23 2121 07/25/23 0553  GLUCAP 108* 102* 102* 129* 88   Lipid Profile: No results for input(s): "CHOL", "HDL", "LDLCALC", "TRIG", "CHOLHDL", "LDLDIRECT"  in the last 72 hours. Thyroid Function Tests: No results for input(s): "TSH", "T4TOTAL", "FREET4", "T3FREE", "THYROIDAB" in the last 72 hours. Anemia Panel: No results for input(s): "VITAMINB12", "FOLATE", "FERRITIN", "TIBC", "IRON", "RETICCTPCT" in the last 72 hours. Sepsis Labs: No results for input(s): "PROCALCITON", "LATICACIDVEN" in the last 168 hours.  Recent Results (from the past 240 hour(s))  SARS Coronavirus 2 by RT PCR (hospital order, performed in [facility] hospital lab) *cepheid single result test* Anterior Nasal Swab     Status: None   Collection Time: 07/22/23 11:20 AM   Specimen: Anterior Nasal Swab  Result Value Ref Range Status   SARS Coronavirus 2 by RT PCR NEGATIVE NEGATIVE Final    Comment: Performed at [facility], [address], [place], [place] [address]  Respiratory (~20 pathogens) panel by PCR     Status: None   Collection Time: 07/22/23 11:20 AM   Specimen: Nasopharyngeal Swab; Respiratory  Result Value Ref Range  Status   Adenovirus NOT DETECTED NOT DETECTED Final   Coronavirus 229E NOT DETECTED NOT DETECTED Final    Comment: (NOTE) The Coronavirus on the Respiratory Panel, DOES NOT test for the novel  Coronavirus (2019 nCoV)    Coronavirus HKU1 NOT DETECTED NOT DETECTED Final   Coronavirus NL63 NOT DETECTED NOT DETECTED Final   Coronavirus OC43 NOT DETECTED NOT DETECTED Final   Metapneumovirus NOT DETECTED NOT DETECTED Final   Rhinovirus / Enterovirus NOT DETECTED NOT DETECTED Final   Influenza A NOT DETECTED NOT DETECTED Final   Influenza B NOT DETECTED NOT DETECTED Final   Parainfluenza Virus 1 NOT DETECTED NOT DETECTED Final   Parainfluenza Virus 2 NOT DETECTED NOT DETECTED Final   Parainfluenza Virus 3 NOT DETECTED NOT DETECTED Final   Parainfluenza Virus 4 NOT DETECTED NOT DETECTED Final   Respiratory Syncytial Virus NOT DETECTED NOT DETECTED Final   Bordetella pertussis NOT DETECTED NOT DETECTED Final   Bordetella Parapertussis NOT  DETECTED NOT DETECTED Final   Chlamydophila pneumoniae NOT DETECTED NOT DETECTED Final   Mycoplasma pneumoniae NOT DETECTED NOT DETECTED Final    Comment: Performed at [facility], [address], [place], [place] [address]      Scheduled Meds:  aspirin EC  81 mg Oral Daily   atorvastatin  20 mg Oral QHS   cholecalciferol  2,000 Units Oral Daily   insulin aspart  0-5 Units Subcutaneous QHS   insulin aspart  0-9 Units Subcutaneous TID WC   labetalol  200 mg Oral BID   sodium bicarbonate  1,300 mg Oral BID   Continuous Infusions:  cefTRIAXone (ROCEPHIN)  IV Stopped (07/24/23 1722)   doxycycline (VIBRAMYCIN) IV 125 mL/hr at 07/25/23 0649   heparin 1,150 Units/hr (07/25/23 0649)          Glade Lloyd, MD [facility] 07/25/2023, 8:21 AM

## 2023-07-25 NOTE — Care Management Important Message (Signed)
Important Message  Patient Details  Name: Dana Fleming MRN: [id number] Date of Birth: 09/25/1941   Medicare Important Message Given:  Yes     Renie Ora 07/25/2023, 12:21 PM

## 2023-07-25 NOTE — Progress Notes (Addendum)
ANTICOAGULATION CONSULT NOTE  Pharmacy Consult for heparin Indication: DVT and PE  Allergies  Allergen Reactions   Simvastatin Cough    Patient Measurements: Height: 5\' 2"  (157.5 cm) Weight: 84.4 kg (186 lb 1.1 oz) IBW/kg (Calculated) : 50.1 Heparin Dosing Weight: 78kg  Vital Signs: Temp: 98.1 F (36.7 C) (08/27 0500) Temp Source: Oral (08/27 0500) BP: 157/73 (08/27 0500) Pulse Rate: 62 (08/27 0500)  Labs: Recent Labs    07/22/23 1108 07/22/23 1305 07/23/23 0102 07/23/23 0422 07/23/23 1146 07/23/23 2039 07/24/23 0758 07/25/23 0633  HGB 10.5*  --   --  8.9*  --   --  9.5* 8.5*  HCT 36.0  --   --  28.6*  --   --  31.0* 27.9*  PLT 174  --   --  195  --   --  198 174  HEPARINUNFRC  --   --    < >  --    < > 0.50 0.51 0.54  CREATININE 2.86*  --   --  2.71*  --   --  2.36*  --   TROPONINIHS 67* 78*  --   --   --   --   --   --    < > = values in this interval not displayed.    Estimated Creatinine Clearance: 18.5 mL/min (A) (by C-G formula based on SCr of 2.36 mg/dL (H)).  Assessment: 82 y.o. female with DVT and PE confirmed by VQ scan on heparin. Not on anticoagulation prior to admission.  8/27 AM: Morning heparin level remains therapeutic at 0.54 with heparin running at 1150 units/hour. Hgb 8.5 (down), PLT 174 (stable). No signs of bleeding or issues with heparin infusion noted per RN.   Goal of Therapy:  Heparin level 0.3-0.7 units/ml Monitor platelets by anticoagulation protocol: Yes   Plan:  Continue heparin gtt at 1150 units/hr Daily heparin level, CBC, s/s bleeding F/U start DOAC   ADDENDUM:  Transitioning to Eliquis (copay $30).   - Stop heparin infusion - START apixaban 10 mg BID x7 days, followed by 5 mg BID- administer first dose at same time heparin infusion is stopped.   - Patient will need education prior to discharge   Jani Gravel, PharmD Clinical Pharmacist  07/25/2023 7:40 AM

## 2023-07-25 NOTE — Progress Notes (Signed)
Mobility Specialist Progress Note:   07/25/23 1122  Mobility  Activity Ambulated with assistance in hallway  Level of Assistance Contact guard assist, steadying assist  Assistive Device Front wheel walker  Distance Ambulated (ft) 60 ft  Activity Response Tolerated well  Mobility Referral Yes  $Mobility charge 1 Mobility  Mobility Specialist Start Time (ACUTE ONLY) 1050  Mobility Specialist Stop Time (ACUTE ONLY) 1112  Mobility Specialist Time Calculation (min) (ACUTE ONLY) 22 min    Pre Mobility: 62 HR ,  100% SpO2 4 L During Mobility: 73 HR , 91-97% SpO2 4 L Post Mobility: 65 HR , 100% SpO2 4 L  Pt received in bed, agreeable to mobility. Pt able to ambulate in hallway with CG and without rest break. Pt denied any SOB during session. Displayed slight fatigue after ambulation, otherwise asymptomatic throughout. Pt left in chair with call bell in hand and all needs met. Family present.   Dana Fleming  Mobility Specialist Please contact via [employer] office at [phone]

## 2023-07-26 DIAGNOSIS — R0602 Shortness of breath: Secondary | ICD-10-CM

## 2023-07-26 DIAGNOSIS — I2699 Other pulmonary embolism without acute cor pulmonale: Secondary | ICD-10-CM | POA: Diagnosis not present

## 2023-07-26 DIAGNOSIS — I10 Essential (primary) hypertension: Secondary | ICD-10-CM

## 2023-07-26 DIAGNOSIS — N179 Acute kidney failure, unspecified: Secondary | ICD-10-CM

## 2023-07-26 DIAGNOSIS — I824Y1 Acute embolism and thrombosis of unspecified deep veins of right proximal lower extremity: Secondary | ICD-10-CM | POA: Diagnosis not present

## 2023-07-26 LAB — RENAL FUNCTION PANEL
Albumin: 2.5 g/dL — ABNORMAL LOW (ref 3.5–5.0)
Anion gap: 9 (ref 5–15)
BUN: 35 mg/dL — ABNORMAL HIGH (ref 8–23)
CO2: 18 mmol/L — ABNORMAL LOW (ref 22–32)
Calcium: 9.1 mg/dL (ref 8.9–10.3)
Chloride: 111 mmol/L (ref 98–111)
Creatinine, Ser: 2.28 mg/dL — ABNORMAL HIGH (ref 0.44–1.00)
GFR, Estimated: 21 mL/min — ABNORMAL LOW (ref >60)
Glucose, Bld: 94 mg/dL (ref 70–99)
Phosphorus: 3.2 mg/dL (ref 2.5–4.6)
Potassium: 4.4 mmol/L (ref 3.5–5.1)
Sodium: 138 mmol/L (ref 135–145)

## 2023-07-26 LAB — MAGNESIUM: Magnesium: 2.1 mg/dL (ref 1.7–2.4)

## 2023-07-26 LAB — CBC
HCT: 27.2 % — ABNORMAL LOW (ref 36.0–46.0)
Hemoglobin: 8.4 g/dL — ABNORMAL LOW (ref 12.0–15.0)
MCH: 22.5 pg — ABNORMAL LOW (ref 26.0–34.0)
MCHC: 30.9 g/dL (ref 30.0–36.0)
MCV: 72.7 fL — ABNORMAL LOW (ref 80.0–100.0)
Platelets: 177 10*3/uL (ref 150–400)
RBC: 3.74 MIL/uL — ABNORMAL LOW (ref 3.87–5.11)
RDW: 16.6 % — ABNORMAL HIGH (ref 11.5–15.5)
WBC: 6.4 10*3/uL (ref 4.0–10.5)
nRBC: 0 % (ref 0.0–0.2)

## 2023-07-26 LAB — GLUCOSE, CAPILLARY
Glucose-Capillary: 101 mg/dL — ABNORMAL HIGH (ref 70–99)
Glucose-Capillary: 160 mg/dL — ABNORMAL HIGH (ref 70–99)
Glucose-Capillary: 116 mg/dL — ABNORMAL HIGH (ref 70–99)
Glucose-Capillary: 157 mg/dL — ABNORMAL HIGH (ref 70–99)
Glucose-Capillary: 131 mg/dL — ABNORMAL HIGH (ref 70–99)

## 2023-07-26 LAB — APTT: aPTT: 71 seconds — ABNORMAL HIGH (ref 24–36)

## 2023-07-26 MED ORDER — OXYMETAZOLINE HCL 0.05 % NA SOLN
2.0000 | Freq: Two times a day (BID) | NASAL | Status: AC | PRN
  Administered 2023-07-26 (×2): 2 via NASAL
  Filled 2023-07-26: qty 30

## 2023-07-26 MED ORDER — HEPARIN (PORCINE) 25000 UT/250ML-% IV SOLN
1200.0000 [IU]/h | INTRAVENOUS | Status: AC
  Administered 2023-07-26 – 2023-07-28 (×3): 1150 [IU]/h via INTRAVENOUS
  Filled 2023-07-26 (×3): qty 250

## 2023-07-26 MED ORDER — ORAL CARE MOUTH RINSE: 15.0000 mL | OROMUCOSAL | Status: DC | PRN

## 2023-07-26 NOTE — Progress Notes (Signed)
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 Workup revealed right lower extremity DVT for which she was started on heparin drip. VQ scan was ordered for suspected PE. She was also found to have left upper lobe pneumonia along with acute kidney injury. She was started on IV fluids and antibiotics. Nephrology was consulted     Assessment/Plan:   Acute right lower extremity DVT/right upper lobe PE -Presented with worsening shortness of breath, found to have right lower extremity DVT -Initially started on heparin GTT, switched to p.o. Eliquis -VQ scan showed possible right upper lobe PE, 2D echo showed EF of 50 to 55% with grade 1 diastolic dysfunction -Will discontinue Eliquis, and start heparin till her renal function improves  Epistaxis -Resolved with Afrin -Eliquis has been discontinued, started on heparin -If epistaxis recurs, consider ENT consultation  Acute kidney injury on CKD stage IIIb -Baseline creatinine 1.5 from April 2024, creatinine was 1.8 on the day of discharge after nephrectomy -Presented with creatinine of 2.86, likely from poor p.o. intake -Creatinine slowly improving, today creatinine is 2.28 -No IV fluids, follow renal function in a.m. -Continue sodium bicarb tablets  Community-acquired pneumonia, left upper lobe pneumonia -Continue Rocephin and doxycycline -RSV, COVID-19 negative  -Currently not requiring oxygen  Hypomagnesemia -Replete  Hypertension -Continue labetalol -Amlodipine was discontinued as outpatient  Hyperlipidemia -Continue statin  Microcytic anemia -Hemoglobin is 8.4 -Follow CBC in a.m.  Mildly positive troponin -Most  likely from demand ischemia.  No chest pain.  2D echo as above.  DC aspirin as patient is on heparin drip as well   Physical deconditioning -PT following.  Recommend home health PT   Obesity -Outpatient follow-up     Medications     apixaban  10 mg Oral BID   Followed by   Melene Muller ON 08/01/2023] apixaban  5 mg Oral BID   atorvastatin  20 mg Oral QHS   cholecalciferol  2,000 Units Oral Daily   insulin aspart  0-5 Units Subcutaneous QHS   insulin aspart  0-9 Units Subcutaneous TID WC   labetalol  200 mg Oral BID   sodium bicarbonate  1,300 mg Oral BID     Data Reviewed:   CBG:  Recent Labs  Lab 07/25/23 1142 07/25/23 1639 07/25/23 2057 07/26/23 0557 07/26/23 1116  GLUCAP 124* 101* 108* 101* 160*    SpO2: 92 % O2 Flow Rate (L/min): 2 L/min    Vitals:   07/26/23 0437 07/26/23 0700 07/26/23 0853 07/26/23 1209  BP:   (!) 175/74 (!) 139/59  Pulse:  64 67 65  Resp:  20 20 (!) 23  Temp:   98.2 F (36.8 C) 98 F (36.7 C)  TempSrc:   Oral Oral  SpO2:  97% 92%   Weight: 85.3 kg     Height:          Data Reviewed:  Basic Metabolic Panel: Recent Labs  Lab 07/22/23 1108 07/23/23 0422 07/24/23 0758 07/25/23 0633 07/26/23 0428  NA 137 137 137 140 138  K 4.6 4.4 4.3 4.5 4.4  CL 110 112* 113* 113* 111  CO2 16* 17* 15* 18* 18*  GLUCOSE 105* 98 101* 93 94  BUN 50* 51* 38* 33* 35*  CREATININE 2.86*  2.71* 2.36* 2.46* 2.28*  CALCIUM 9.6 8.7* 9.0 9.1 9.1  MG  --   --  1.7 1.6* 2.1  PHOS  --   --  3.0 3.6 3.2    CBC: Recent Labs  Lab 07/22/23 1108 07/23/23 0422 07/24/23 0758 07/25/23 0633 07/26/23 0428  WBC 8.8 7.5 8.2 6.5 6.4  NEUTROABS 6.6  --   --   --   --   HGB 10.5* 8.9* 9.5* 8.5* 8.4*  HCT 36.0 28.6* 31.0* 27.9* 27.2*  MCV 77.4* 71.9* 73.1* 74.0* 72.7*  PLT 174 195 198 174 177    LFT Recent Labs  Lab 07/22/23 1108 07/23/23 0422 07/24/23 0758 07/25/23 0633 07/26/23 0428  AST 22 17  --   --   --   ALT 15 14  --   --   --   ALKPHOS 82  70  --   --   --   BILITOT 0.9 0.8  --   --   --   PROT 7.5 6.2*  --   --   --   ALBUMIN 3.4* 2.8* 2.8* 2.5* 2.5*     Antibiotics: Anti-infectives (From admission, onward)    Start     Dose/Rate Route Frequency Ordered Stop   07/23/23 1700  cefTRIAXone (ROCEPHIN) 1 g in sodium chloride 0.9 % 100 mL IVPB        1 g 200 mL/hr over 30 Minutes Intravenous Every 24 hours 07/22/23 1746 07/26/23 2359   07/22/23 1800  metroNIDAZOLE (FLAGYL) IVPB 500 mg  Status:  Discontinued        500 mg 100 mL/hr over 60 Minutes Intravenous Every 12 hours 07/22/23 1746 07/23/23 1626   07/22/23 1600  cefTRIAXone (ROCEPHIN) 1 g in sodium chloride 0.9 % 100 mL IVPB        1 g 200 mL/hr over 30 Minutes Intravenous  Once 07/22/23 1548 07/22/23 1740   07/22/23 1600  doxycycline (VIBRAMYCIN) 100 mg in sodium chloride 0.9 % 250 mL IVPB        100 mg 125 mL/hr over 120 Minutes Intravenous Every 12 hours 07/22/23 1548 07/27/23 1000        DVT prophylaxis: On full dose anticoagulation  Code Status: Full code  Family Communication: Discussed with family members at bedside   CONSULTS nephrology   Subjective   Had nosebleed this morning, resolved after Afrin was given.  Eliquis has been discontinued   Objective    Physical Examination:   General-appears in no acute distress Heart-S1-S2, regular, no murmur auscultated Lungs-clear to auscultation bilaterally, no wheezing or crackles auscultated Abdomen-soft, nontender, no organomegaly Extremities-no edema in the lower extremities Neuro-alert, oriented x3, no focal deficit noted  Status is: Inpatient:             Meredeth Ide   [facility] If 7PM-7AM, please contact night-coverage at [email or web address], Office  [phone]   07/26/2023, 12:33 PM  LOS: 4 days

## 2023-07-26 NOTE — Progress Notes (Signed)
ANTICOAGULATION CONSULT NOTE- Follow Up Pharmacy Consult for heparin Indication: DVT and PE  Allergies  Allergen Reactions   Simvastatin Cough    Patient Measurements: Height: 5\' 2"  (157.5 cm) Weight: 85.3 kg (188 lb 1.6 oz) IBW/kg (Calculated) : 50.1 Heparin Dosing Weight: 78kg  Vital Signs: Temp: 97.9 F (36.6 C) (08/28 2326) Temp Source: Oral (08/28 2324) BP: 168/70 (08/28 2326) Pulse Rate: 67 (08/28 2326)  Labs: Recent Labs    07/24/23 0758 07/25/23 1610 07/26/23 0428 07/26/23 2131  HGB 9.5* 8.5* 8.4*  --   HCT 31.0* 27.9* 27.2*  --   PLT 198 174 177  --   APTT  --   --   --  71*  HEPARINUNFRC 0.51 0.54  --   --   CREATININE 2.36* 2.46* 2.28*  --     Estimated Creatinine Clearance: 19.3 mL/min (A) (by C-G formula based on SCr of 2.28 mg/dL (H)).  Assessment: 82 y.o. female with DVT and PE confirmed by VQ scan on heparin. Not on anticoagulation prior to admission.  Patient received 2 doses of apixaban 8/27- last dose 2131. Patient developed a persistent nose bleed overnight and decision was made to restart patient on heparin. Hgb early this AM was stable at 8.4. Will restart at previously-therapeutic rate.   8/28 PM: aPTT returned at 71 seconds on 1150 units/hr (therapeutic). No signs/symptoms of bleeding reported or issues with the heparin infusion. Hgb low, stable and plts WNL.   Goal of Therapy:  Heparin level 0.3-0.7 units/ml aPTT 66-102 seconds Monitor platelets by anticoagulation protocol: Yes   Plan:  Restart heparin gtt at 1150 units/hr F/U 8-hour confirmatory aPTT  Daily heparin level/aPTT until levels correlate, CBC, s/s bleeding F/U long-term anticoag plans   Arabella Merles, PharmD. Clinical Pharmacist 07/26/2023 11:45 PM

## 2023-07-26 NOTE — Progress Notes (Signed)
@  0230, Dr. Antionette Char text-paged regarding pt's persistent nose bleed (both nares) that has been increasing in intensity since shift change. Page promptly returned and orders received to administer Afrin. Will implement and continue to monitor.

## 2023-07-26 NOTE — Progress Notes (Signed)
ANTICOAGULATION CONSULT NOTE  Pharmacy Consult for heparin Indication: DVT and PE  Allergies  Allergen Reactions   Simvastatin Cough    Patient Measurements: Height: 5\' 2"  (157.5 cm) Weight: 85.3 kg (188 lb 1.6 oz) IBW/kg (Calculated) : 50.1 Heparin Dosing Weight: 78kg  Vital Signs: Temp: 98 F (36.7 C) (08/28 1209) Temp Source: Oral (08/28 1209) BP: 139/59 (08/28 1209) Pulse Rate: 65 (08/28 1209)  Labs: Recent Labs    07/23/23 2039 07/24/23 0758 07/24/23 0758 07/25/23 0633 07/26/23 0428  HGB  --  9.5*   < > 8.5* 8.4*  HCT  --  31.0*  --  27.9* 27.2*  PLT  --  198  --  174 177  HEPARINUNFRC 0.50 0.51  --  0.54  --   CREATININE  --  2.36*  --  2.46* 2.28*   < > = values in this interval not displayed.    Estimated Creatinine Clearance: 19.3 mL/min (A) (by C-G formula based on SCr of 2.28 mg/dL (H)).  Assessment: 82 y.o. female with DVT and PE confirmed by VQ scan on heparin. Not on anticoagulation prior to admission.  8/28: Patient received 2 doses of apixaban 8/27- last dose 2131. Patient developed a persistent nose bleed overnight and decision was made to restart patient on heparin. Hgb early this AM was stable at 8.4. Will restart at previously-therapeutic rate.   Goal of Therapy:  Heparin level 0.3-0.7 units/ml Monitor platelets by anticoagulation protocol: Yes   Plan:  Restart heparin gtt at 1150 units/hr F/U 8-hour aPTT  Daily heparin level/aPTT, CBC, s/s bleeding F/U long-term anticoag plans   Jani Gravel, PharmD Clinical Pharmacist  07/26/2023 1:24 PM

## 2023-07-26 NOTE — Progress Notes (Signed)
Hold eliquis this am due to moderate amount of nose bleeding. Administered afrin. MD and pharmacist notified.   Lawson Radar, RN

## 2023-07-26 NOTE — Plan of Care (Signed)
  Problem: Education: Goal: Knowledge of General Education information will improve Description Including pain rating scale, medication(s)/side effects and non-pharmacologic comfort measures Outcome: Progressing   

## 2023-07-27 DIAGNOSIS — J189 Pneumonia, unspecified organism: Secondary | ICD-10-CM

## 2023-07-27 DIAGNOSIS — N179 Acute kidney failure, unspecified: Secondary | ICD-10-CM | POA: Diagnosis not present

## 2023-07-27 DIAGNOSIS — R0600 Dyspnea, unspecified: Secondary | ICD-10-CM | POA: Diagnosis not present

## 2023-07-27 DIAGNOSIS — I82401 Acute embolism and thrombosis of unspecified deep veins of right lower extremity: Secondary | ICD-10-CM | POA: Diagnosis not present

## 2023-07-27 LAB — GLUCOSE, CAPILLARY
Glucose-Capillary: 104 mg/dL — ABNORMAL HIGH (ref 70–99)
Glucose-Capillary: 128 mg/dL — ABNORMAL HIGH (ref 70–99)
Glucose-Capillary: 183 mg/dL — ABNORMAL HIGH (ref 70–99)
Glucose-Capillary: 130 mg/dL — ABNORMAL HIGH (ref 70–99)
Glucose-Capillary: 104 mg/dL — ABNORMAL HIGH (ref 70–99)

## 2023-07-27 LAB — BASIC METABOLIC PANEL
Anion gap: 8 (ref 5–15)
BUN: 35 mg/dL — ABNORMAL HIGH (ref 8–23)
CO2: 20 mmol/L — ABNORMAL LOW (ref 22–32)
Calcium: 9.2 mg/dL (ref 8.9–10.3)
Chloride: 112 mmol/L — ABNORMAL HIGH (ref 98–111)
Creatinine, Ser: 2.43 mg/dL — ABNORMAL HIGH (ref 0.44–1.00)
GFR, Estimated: 19 mL/min — ABNORMAL LOW (ref >60)
Glucose, Bld: 114 mg/dL — ABNORMAL HIGH (ref 70–99)
Potassium: 4.9 mmol/L (ref 3.5–5.1)
Sodium: 140 mmol/L (ref 135–145)

## 2023-07-27 LAB — CBC
HCT: 26.3 % — ABNORMAL LOW (ref 36.0–46.0)
Hemoglobin: 8.1 g/dL — ABNORMAL LOW (ref 12.0–15.0)
MCH: 22.3 pg — ABNORMAL LOW (ref 26.0–34.0)
MCHC: 30.8 g/dL (ref 30.0–36.0)
MCV: 72.3 fL — ABNORMAL LOW (ref 80.0–100.0)
Platelets: 198 10*3/uL (ref 150–400)
RBC: 3.64 MIL/uL — ABNORMAL LOW (ref 3.87–5.11)
RDW: 16.8 % — ABNORMAL HIGH (ref 11.5–15.5)
WBC: 6.5 10*3/uL (ref 4.0–10.5)
nRBC: 0 % (ref 0.0–0.2)

## 2023-07-27 LAB — HEPARIN LEVEL (UNFRACTIONATED): Heparin Unfractionated: >1.1 [IU]/mL — ABNORMAL HIGH (ref 0.30–0.70)

## 2023-07-27 LAB — APTT: aPTT: 91 s — ABNORMAL HIGH (ref 24–36)

## 2023-07-27 NOTE — Progress Notes (Signed)
Physical Therapy Treatment Patient Details Name: Dana Fleming MRN: [id number] DOB: 11-10-1941 Today's Date: 07/27/2023   History of Present Illness Patient is an 82 y/o female admitted 07/22/23 with SOB, found to have DVT RLE and acute PE as well as L UL pneumonia and AKI.  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    PT Comments  Patient progressing with ambulation distance and able to walk without seated rest.  She stayed on RA with SpO2 94% or greater though some SOB with RR 32.  Feel she will continue to benefit from skilled PT in the acute setting.  Follow up HHPT recommended.    If plan is discharge home, recommend the following: A little help with walking and/or transfers;A little help with bathing/dressing/bathroom;Assistance with cooking/housework;Assist for transportation;Help with stairs or ramp for entrance   Can travel by private vehicle        Equipment Recommendations  Rollator (4 wheels)    Recommendations for Other Services       Precautions / Restrictions Precautions Precautions: Fall     Mobility  Bed Mobility   Bed Mobility: Supine to Sit, Sit to Supine     Supine to sit: Supervision Sit to supine: Min assist   General bed mobility comments: sits up unaided, help for lines; to supine assist for legs onto bed    Transfers Overall transfer level: Needs assistance Equipment used: Rollator (4 wheels) Transfers: Sit to/from Stand Sit to Stand: Supervision                Ambulation/Gait Ambulation/Gait assistance: Contact guard assist, Supervision Gait Distance (Feet): 280 Feet Assistive device: Rollator (4 wheels) Gait Pattern/deviations: Step-through pattern, Decreased stride length       General Gait Details: on RA throughout SpO2 and HR stable RR 32   Stairs             Wheelchair Mobility     Tilt Bed    Modified Rankin (Stroke Patients Only)       Balance Overall balance assessment:  Needs assistance Sitting-balance support: Feet supported Sitting balance-Leahy Scale: Good     Standing balance support: No upper extremity supported, During functional activity, Single extremity supported Standing balance-Leahy Scale: Fair Standing balance comment: in room parked rollator to walk to the bed                            Cognition Arousal: Alert Behavior During Therapy: [facility] for tasks assessed/performed Overall Cognitive Status: Within Functional Limits for tasks assessed                                          Exercises      General Comments General comments (skin integrity, edema, etc.): multiple family members in the room supportive      Pertinent Vitals/Pain Pain Assessment Pain Assessment: No/denies pain    Home Living                          Prior Function            PT Goals (current goals can now be found in the care plan section) Progress towards PT goals: Progressing toward goals    Frequency    Min 1X/week      PT Plan  Co-evaluation              AM-PAC PT "6 Clicks" Mobility   Outcome Measure  Help needed turning from your back to your side while in a flat bed without using bedrails?: None Help needed moving from lying on your back to sitting on the side of a flat bed without using bedrails?: None Help needed moving to and from a bed to a chair (including a wheelchair)?: A Little Help needed standing up from a chair using your arms (e.g., wheelchair or bedside chair)?: A Little Help needed to walk in hospital room?: A Little Help needed climbing 3-5 steps with a railing? : Total 6 Click Score: 18    End of Session Equipment Utilized During Treatment: Gait belt Activity Tolerance: Patient limited by fatigue Patient left: in bed;with family/visitor present   PT Visit Diagnosis: Other abnormalities of gait and mobility (R26.89);Difficulty in walking, not elsewhere classified  (R26.2)     Time: 2440-1027 PT Time Calculation (min) (ACUTE ONLY): 24 min  Charges:    $Gait Training: 8-22 mins $Therapeutic Activity: 8-22 mins PT General Charges $$ ACUTE PT VISIT: 1 Visit                     Sheran Lawless, PT Acute Rehabilitation Services [phone] 07/27/2023    Dana Fleming 07/27/2023, 3:53 PM

## 2023-07-27 NOTE — Progress Notes (Signed)
ANTICOAGULATION CONSULT NOTE- Follow Up Pharmacy Consult for heparin Indication: DVT and PE  Allergies  Allergen Reactions   Simvastatin Cough    Patient Measurements: Height: 5\' 2"  (157.5 cm) Weight: 84.9 kg (187 lb 2.7 oz) IBW/kg (Calculated) : 50.1 Heparin Dosing Weight: 78kg  Vital Signs: Temp: 98 F (36.7 C) (08/29 0810) Temp Source: Oral (08/29 0810) BP: 156/61 (08/29 0810) Pulse Rate: 67 (08/29 0810)  Labs: Recent Labs    07/25/23 0865 07/26/23 0428 07/26/23 2131 07/27/23 0356 07/27/23 0805  HGB 8.5* 8.4*  --  8.1*  --   HCT 27.9* 27.2*  --  26.3*  --   PLT 174 177  --  198  --   APTT  --   --  71*  --  91*  HEPARINUNFRC 0.54  --   --  >1.10*  --   CREATININE 2.46* 2.28*  --  2.43*  --     Estimated Creatinine Clearance: 18 mL/min (A) (by C-G formula based on SCr of 2.43 mg/dL (H)).  Assessment: 82 y.o. female with DVT and PE confirmed by VQ scan on heparin. Not on anticoagulation prior to admission.  Patient received 2 doses of apixaban 8/27- last dose 21:31. Patient developed a persistent nose bleed overnight and decision was made to restart patient on heparin. Hgb early this AM was stable at 8.4. Will restart at previously-therapeutic rate.   8/29 AM:  aPTT returned at 91 seconds HL > 1.10 No signs of bleeding or issues with hep gtt   Goal of Therapy:  Heparin level 0.3-0.7 units/ml aPTT 66-102 seconds Monitor platelets by anticoagulation protocol: Yes   Plan:  Continue heparin gtt at 1150 units/hr Daily heparin level/aPTT until levels correlate, CBC, s/s bleeding F/U long-term anticoag plans    Aubra Pappalardo BS, PharmD, BCPS Clinical Pharmacist 07/27/2023 9:43 AM  Contact: [phone] after 3 PM  "Be curious, not judgmental..." -Debbora Dus

## 2023-07-27 NOTE — Progress Notes (Signed)
[facility]                                                                              Dana Fleming, is a 82 y.o. 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 Workup revealed right lower extremity DVT for which she was started on heparin drip. VQ scan was ordered for suspected PE. She was also found to have left upper lobe pneumonia along with acute kidney injury. She was started on IV fluids and antibiotics. Nephrology was consulted    Assessment & Plan    Principal Problem: Acute right DVT (deep venous thrombosis) (HCC), right upper lobe PE -Presented with worsening shortness of breath, venous Dopplers positive for RLE.  VQ scan showed possible right upper lobe PE -2D echo showed EF of 50 to 55% with G1 DD -Patient was initially started on heparin drip, then transitioned to Eliquis, subsequently heparin drip restarted due to worsening renal function.  Will discuss with nephrology regarding DOAC's.  Active problems   Epistaxis -Resolved, patient was restarted on heparin -No further epistaxis   Acute kidney injury on CKD stage IIIb -Baseline creatinine 1.5 from April 2024, creatinine was 1.8 on the day of discharge after nephrectomy -Presented with creatinine of 2.86, likely from poor p.o. intake.  Recent ultrasound with no hydronephrosis and left kidney. -Creatinine 2.43 today -Continue sodium bicarb tablets -Patient was seen by nephrology, recommended outpatient follow-up, no need for dialysis.   Community-acquired pneumonia, left upper lobe pneumonia -Completed IV  Rocephin and doxycycline -RSV, COVID-19 negative  -Currently not requiring oxygen, O2 sats 94% to 99% on room air   Hypomagnesemia -Replete as needed   Hypertension -Continue labetalol -Amlodipine was discontinued as outpatient -BP stable   Hyperlipidemia -Continue statin   Microcytic anemia -Hemoglobin 8.1 -No further epistaxis or any bleeding. -Will continue the heparin drip today, if H&H stabilizes, may transition to DOAC in a.m.   Mildly positive troponin -Most likely from demand ischemia.  No chest pain.  2D echo as above.   -Aspirin is discontinued as patient is on anticoagulation.     Physical deconditioning -PT evaluation recommended home health PT   Obesity Estimated body mass index is 34.23 kg/m as calculated from the following:   Height as of this encounter: 5\' 2"  (1.575 m).   Weight as of this encounter: 84.9 kg.  Code Status: Full CODE STATUS DVT Prophylaxis:     Level of Care: Level of care: Telemetry Medical Family Communication: Updated patient Disposition Plan:      Remains inpatient appropriate: Possible DC home in 24 to 48 hours pending transition to DOAC's and improvement in renal function   Procedures:  2D echo  Consultants:   Nephrology  Antimicrobials:  Anti-infectives (From admission, onward)    Start     Dose/Rate Route Frequency Ordered Stop   07/23/23 1700  cefTRIAXone (ROCEPHIN) 1 g in sodium chloride 0.9 % 100 mL IVPB        1 g 200 mL/hr over 30 Minutes Intravenous Every 24 hours 07/22/23 1746 07/26/23 1832   07/22/23 1800  metroNIDAZOLE (FLAGYL) IVPB 500 mg  Status:  Discontinued        500 mg 100 mL/hr over 60 Minutes Intravenous Every 12 hours 07/22/23 1746 07/23/23 1626   07/22/23 1600  cefTRIAXone (ROCEPHIN) 1 g in sodium chloride 0.9 % 100 mL IVPB        1 g 200 mL/hr over 30 Minutes Intravenous  Once 07/22/23 1548 07/22/23 1740   07/22/23 1600  doxycycline (VIBRAMYCIN) 100 mg in sodium chloride 0.9 % 250 mL IVPB         100 mg 125 mL/hr over 120 Minutes Intravenous Every 12 hours 07/22/23 1548 07/27/23 0947          Medications  atorvastatin  20 mg Oral QHS   cholecalciferol  2,000 Units Oral Daily   insulin aspart  0-5 Units Subcutaneous QHS   insulin aspart  0-9 Units Subcutaneous TID WC   labetalol  200 mg Oral BID   sodium bicarbonate  1,300 mg Oral BID      Subjective:   Dana Fleming was seen and examined today.  No further epistaxis.  No chest pain, shortness of breath, fevers.  Patient denies dizziness, abdominal pain, N/V/D/C.  No acute events overnight.  Objective:   Vitals:   07/27/23 0246 07/27/23 0255 07/27/23 0810 07/27/23 1103  BP:  (!) 158/62 (!) 156/61 (!) 124/50  Pulse:  66 67 66  Resp:  20 20 20   Temp:  97.9 F (36.6 C) 98 F (36.7 C) 97.6 F (36.4 C)  TempSrc:  Oral Oral Oral  SpO2:  97% 99% 93%  Weight: 84.9 kg     Height:        Intake/Output Summary (Last 24 hours) at 07/27/2023 1209 Last data filed at 07/27/2023 0730 Gross per 24 hour  Intake 843.71 ml  Output --  Net 843.71 ml     Wt Readings from Last 3 Encounters:  07/27/23 84.9 kg  03/29/23 82.6 kg  03/16/23 82.6 kg     Exam General: Alert and oriented x 3, NAD Cardiovascular: S1 S2 auscultated,  RRR Respiratory: Clear to auscultation bilaterally, no wheezing Gastrointestinal: Soft, nontender, nondistended, + bowel sounds Ext: no pedal edema bilaterally Neuro: Strength 5/5 upper and lower extremities bilaterally Psych: Normal affect     Data Reviewed:  I have personally reviewed following labs    CBC Lab Results  Component Value Date   WBC 6.5 07/27/2023   RBC 3.64 (L) 07/27/2023   HGB 8.1 (L) 07/27/2023   HCT 26.3 (L) 07/27/2023   MCV 72.3 (L) 07/27/2023   MCH 22.3 (L) 07/27/2023   PLT 198 07/27/2023   MCHC 30.8 07/27/2023   RDW 16.8 (H) 07/27/2023   LYMPHSABS 1.2 07/22/2023   MONOABS 0.8 07/22/2023   EOSABS 0.1 07/22/2023   BASOSABS 0.0 07/22/2023     Last  metabolic panel Lab Results  Component Value Date   NA 140 07/27/2023   K 4.9 07/27/2023   CL 112 (H) 07/27/2023   CO2 20 (L) 07/27/2023   BUN 35 (H) 07/27/2023   CREATININE 2.43 (H) 07/27/2023   GLUCOSE 114 (H) 07/27/2023   GFRNONAA 19 (  L) 07/27/2023   GFRAA 35 (L) 01/14/2016   CALCIUM 9.2 07/27/2023   PHOS 3.2 07/26/2023   PROT 6.2 (L) 07/23/2023   ALBUMIN 2.5 (L) 07/26/2023   BILITOT 0.8 07/23/2023   ALKPHOS 70 07/23/2023   AST 17 07/23/2023   ALT 14 07/23/2023   ANIONGAP 8 07/27/2023    CBG (last 3)  Recent Labs    07/26/23 2105 07/27/23 0559 07/27/23 1120  GLUCAP 131* 104* 128*      Coagulation Profile: No results for input(s): "INR", "PROTIME" in the last 168 hours.   Radiology Studies: I have personally reviewed the imaging studies  No results found.     Thad Ranger M.D. [facility] 07/27/2023, 12:09 PM  Available via Epic secure chat 7am-7pm After 7 pm, please refer to night coverage provider listed on amion.

## 2023-07-28 DIAGNOSIS — N179 Acute kidney failure, unspecified: Secondary | ICD-10-CM | POA: Diagnosis not present

## 2023-07-28 DIAGNOSIS — R0600 Dyspnea, unspecified: Secondary | ICD-10-CM | POA: Diagnosis not present

## 2023-07-28 DIAGNOSIS — I82401 Acute embolism and thrombosis of unspecified deep veins of right lower extremity: Secondary | ICD-10-CM | POA: Diagnosis not present

## 2023-07-28 DIAGNOSIS — R0602 Shortness of breath: Secondary | ICD-10-CM | POA: Diagnosis not present

## 2023-07-28 LAB — CBC
HCT: 26 % — ABNORMAL LOW (ref 36.0–46.0)
Hemoglobin: 8 g/dL — ABNORMAL LOW (ref 12.0–15.0)
MCH: 22.3 pg — ABNORMAL LOW (ref 26.0–34.0)
MCHC: 30.8 g/dL (ref 30.0–36.0)
MCV: 72.6 fL — ABNORMAL LOW (ref 80.0–100.0)
Platelets: 208 10*3/uL (ref 150–400)
RBC: 3.58 MIL/uL — ABNORMAL LOW (ref 3.87–5.11)
RDW: 16.7 % — ABNORMAL HIGH (ref 11.5–15.5)
WBC: 5.8 10*3/uL (ref 4.0–10.5)
nRBC: 0 % (ref 0.0–0.2)

## 2023-07-28 LAB — BASIC METABOLIC PANEL
Anion gap: 7 (ref 5–15)
BUN: 28 mg/dL — ABNORMAL HIGH (ref 8–23)
CO2: 23 mmol/L (ref 22–32)
Calcium: 9.1 mg/dL (ref 8.9–10.3)
Chloride: 109 mmol/L (ref 98–111)
Creatinine, Ser: 2.3 mg/dL — ABNORMAL HIGH (ref 0.44–1.00)
GFR, Estimated: 21 mL/min — ABNORMAL LOW (ref >60)
Glucose, Bld: 93 mg/dL (ref 70–99)
Potassium: 4.2 mmol/L (ref 3.5–5.1)
Sodium: 139 mmol/L (ref 135–145)

## 2023-07-28 LAB — GLUCOSE, CAPILLARY
Glucose-Capillary: 89 mg/dL (ref 70–99)
Glucose-Capillary: 88 mg/dL (ref 70–99)
Glucose-Capillary: 91 mg/dL (ref 70–99)
Glucose-Capillary: 84 mg/dL (ref 70–99)
Glucose-Capillary: 74 mg/dL (ref 70–99)

## 2023-07-28 LAB — APTT
aPTT: 106 s — ABNORMAL HIGH (ref 24–36)
aPTT: 77 seconds — ABNORMAL HIGH (ref 24–36)

## 2023-07-28 LAB — HEPARIN LEVEL (UNFRACTIONATED): Heparin Unfractionated: 0.97 [IU]/mL — ABNORMAL HIGH (ref 0.30–0.70)

## 2023-07-28 NOTE — Progress Notes (Signed)
[facility]                                                                              Dana Fleming, is a 82 y.o. 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 Workup revealed right lower extremity DVT for which she was started on heparin drip. VQ scan was ordered for suspected PE. She was also found to have left upper lobe pneumonia along with acute kidney injury. She was started on IV fluids and antibiotics. Nephrology was consulted    Assessment & Plan    Principal Problem: Acute right DVT (deep venous thrombosis) (HCC), right upper lobe PE -Presented with worsening shortness of breath, venous Dopplers positive for RLE.  VQ scan showed possible right upper lobe PE -2D echo showed EF of 50 to 55% with G1 DD -Patient was initially started on heparin drip, then transitioned to Eliquis, subsequently heparin drip restarted due to worsening renal function.  -Discussed with nephrology, Dr. Ronalee Fleming on 8/29, okay to do eliquis.  Discussed in detail with the patient and family members on the phone regarding DOAC's versus Coumadin/Lovenox until INR therapeutic.  Patient prefers being on Eliquis.  -Hemoglobin slightly down today 8.0, will continue IV heparin drip today, check FOBT, anemia panel -Follow CBC tomorrow, if hemoglobin stable, will place on eliquis -Per patient, had a BM today, no bleeding noted  Active problems   Epistaxis -Resolved, patient was restarted on heparin -No further epistaxis   Acute kidney injury on CKD stage IIIb -Baseline creatinine 1.5 from April 2024,  creatinine was 1.8 on the day of discharge after nephrectomy -Presented with creatinine of 2.86, likely from poor p.o. intake.  Recent ultrasound with no hydronephrosis and left kidney. -Continue sodium bicarb tablets -Patient was seen by nephrology, recommended outpatient follow-up, no need for dialysis. -Creatinine improving, 2.3   Community-acquired pneumonia, left upper lobe pneumonia -Completed IV Rocephin and doxycycline -RSV, COVID-19 negative  -No hypoxia, O2 sats 98% on room air   Hypomagnesemia -Replete as needed   Hypertension -Continue labetalol -Amlodipine was discontinued as outpatient -BP stable   Hyperlipidemia -Continue statin   Microcytic anemia -Hemoglobin 8.0 -No further epistaxis or any bleeding. -Follow CBC in a.m.    Mildly positive troponin -Most likely from demand ischemia.  No chest pain.  2D echo as above.   -Aspirin is discontinued as patient is on anticoagulation.     Physical deconditioning -PT evaluation recommended home health PT   Obesity Estimated body mass index is 34.07 kg/m as calculated from the following:   Height as of this encounter: 5\' 2"  (1.575 m).   Weight as of this encounter: 84.5 kg.  Code Status: Full CODE STATUS DVT Prophylaxis:     Level of  Care: Level of care: Telemetry Medical Family Communication: Updated patient's cousin and daughter on the phone in the room Disposition Plan:      Remains inpatient appropriate: Possible DC home in 82 to 48 hours pending transition to DOAC's and improvement in renal function   Procedures:  2D echo  Consultants:   Nephrology  Antimicrobials:   Anti-infectives (From admission, onward)    Start     Dose/Rate Route Frequency Ordered Stop   07/23/23 1700  cefTRIAXone (ROCEPHIN) 1 g in sodium chloride 0.9 % 100 mL IVPB        1 g 200 mL/hr over 30 Minutes Intravenous Every 24 hours 07/22/23 1746 07/26/23 1832   07/22/23 1800  metroNIDAZOLE (FLAGYL) IVPB 500 mg  Status:   Discontinued        500 mg 100 mL/hr over 60 Minutes Intravenous Every 12 hours 07/22/23 1746 07/23/23 1626   07/22/23 1600  cefTRIAXone (ROCEPHIN) 1 g in sodium chloride 0.9 % 100 mL IVPB        1 g 200 mL/hr over 30 Minutes Intravenous  Once 07/22/23 1548 07/22/23 1740   07/22/23 1600  doxycycline (VIBRAMYCIN) 100 mg in sodium chloride 0.9 % 250 mL IVPB        100 mg 125 mL/hr over 120 Minutes Intravenous Every 12 hours 07/22/23 1548 07/27/23 0947          Medications  atorvastatin  20 mg Oral QHS   cholecalciferol  2,000 Units Oral Daily   insulin aspart  0-5 Units Subcutaneous QHS   insulin aspart  0-9 Units Subcutaneous TID WC   labetalol  200 mg Oral BID   sodium bicarbonate  1,300 mg Oral BID      Subjective:   Dana Fleming was seen and examined today.  No epistaxis, no hematochezia or melena noted.  No chest pain, shortness of breath, fevers or chills.  No abdominal pain.    Objective:   Vitals:   07/27/23 2330 07/28/23 0329 07/28/23 0854 07/28/23 1212  BP: (!) 159/65 (!) 156/65 (!) 172/78 (!) 152/59  Pulse: 67 66 63 65  Resp: 20 19 20 17   Temp: 98.7 F (37.1 C) 98.1 F (36.7 C) 98.2 F (36.8 C) 98.4 F (36.9 C)  TempSrc: Oral Oral Oral Oral  SpO2: 93% 93% 98% 98%  Weight:  84.5 kg    Height:        Intake/Output Summary (Last 24 hours) at 07/28/2023 1350 Last data filed at 07/28/2023 1000 Gross per 24 hour  Intake 626.04 ml  Output --  Net 626.04 ml     Wt Readings from Last 3 Encounters:  07/28/23 84.5 kg  03/29/23 82.6 kg  03/16/23 82.6 kg    Physical Exam General: Alert and oriented x 3, NAD Cardiovascular: S1 S2 clear, RRR.  Respiratory: CTAB, no wheezing Gastrointestinal: Soft, nontender, nondistended, NBS Ext: no pedal edema bilaterally Neuro: no new deficits Psych: Normal affect    Data Reviewed:  I have personally reviewed following labs    CBC Lab Results  Component Value Date   WBC 5.8 07/28/2023   RBC 3.58 (L)  07/28/2023   HGB 8.0 (L) 07/28/2023   HCT 26.0 (L) 07/28/2023   MCV 72.6 (L) 07/28/2023   MCH 22.3 (L) 07/28/2023   PLT 208 07/28/2023   MCHC 30.8 07/28/2023   RDW 16.7 (H) 07/28/2023   LYMPHSABS 1.2 07/22/2023   MONOABS 0.8 07/22/2023   EOSABS 0.1 07/22/2023   BASOSABS 0.0 07/22/2023  Last metabolic panel Lab Results  Component Value Date   NA 139 07/28/2023   K 4.2 07/28/2023   CL 109 07/28/2023   CO2 23 07/28/2023   BUN 28 (H) 07/28/2023   CREATININE 2.30 (H) 07/28/2023   GLUCOSE 93 07/28/2023   GFRNONAA 21 (L) 07/28/2023   GFRAA 35 (L) 01/14/2016   CALCIUM 9.1 07/28/2023   PHOS 3.2 07/26/2023   PROT 6.2 (L) 07/23/2023   ALBUMIN 2.5 (L) 07/26/2023   BILITOT 0.8 07/23/2023   ALKPHOS 70 07/23/2023   AST 17 07/23/2023   ALT 14 07/23/2023   ANIONGAP 7 07/28/2023    CBG (last 3)  Recent Labs    07/28/23 0609 07/28/23 0953 07/28/23 1206  GLUCAP 89 88 91      Coagulation Profile: No results for input(s): "INR", "PROTIME" in the last 168 hours.   Radiology Studies: I have personally reviewed the imaging studies  No results found.     Thad Ranger M.D. [facility] 07/28/2023, 1:50 PM  Available via Epic secure chat 7am-7pm After 7 pm, please refer to night coverage provider listed on amion.

## 2023-07-28 NOTE — Progress Notes (Signed)
Mobility Specialist Progress Note:   07/28/23 1138  Mobility  Activity Ambulated with assistance in hallway  Level of Assistance Contact guard assist, steadying assist  Assistive Device Front wheel walker  Distance Ambulated (ft) 280 ft  Activity Response Tolerated well  Mobility Referral Yes  $Mobility charge 1 Mobility  Mobility Specialist Start Time (ACUTE ONLY) 1100  Mobility Specialist Stop Time (ACUTE ONLY) 1115  Mobility Specialist Time Calculation (min) (ACUTE ONLY) 15 min   Pre Mobility: 60 HR ,  99% SpO2 During Mobility: 96% SpO2 Post Mobility: 59 HR , 98% SpO2  Pt received in bed, agreeable to mobility. Denied any feelings of discomfort or SOB during ambulation, asymptomatic throughout. Returning to room pt requesting to use BR. Void successful. Pt left in chair with call bell in hand and all needs met.  Leory Plowman  Mobility Specialist Please contact via [employer] office at [phone]

## 2023-07-28 NOTE — Progress Notes (Signed)
ANTICOAGULATION CONSULT NOTE- Follow Up Pharmacy Consult for heparin Indication: DVT and PE  Allergies  Allergen Reactions   Simvastatin Cough    Patient Measurements: Height: 5\' 2"  (157.5 cm) Weight: 84.5 kg (186 lb 4.6 oz) IBW/kg (Calculated) : 50.1 Heparin Dosing Weight: 78kg  Vital Signs: Temp: 98.1 F (36.7 C) (08/30 0329) Temp Source: Oral (08/30 0329) BP: 156/65 (08/30 0329) Pulse Rate: 66 (08/30 0329)  Labs: Recent Labs    07/26/23 0428 07/26/23 2131 07/27/23 0356 07/27/23 0805 07/28/23 0738  HGB 8.4*  --  8.1*  --  8.0*  HCT 27.2*  --  26.3*  --  26.0*  PLT 177  --  198  --  208  APTT  --  71*  --  91* 106*  HEPARINUNFRC  --   --  >1.10*  --  0.97*  CREATININE 2.28*  --  2.43*  --  2.30*    Estimated Creatinine Clearance: 19 mL/min (A) (by C-G formula based on SCr of 2.3 mg/dL (H)).  Assessment: 82 y.o. female with DVT and PE confirmed by VQ scan on heparin. Not on anticoagulation prior to admission.  Patient received 2 doses of apixaban 8/27- last dose 21:31. Patient developed a persistent nose bleed overnight and decision was made to restart patient on heparin. Hgb early this AM was stable at 8.4. Will restart at previously-therapeutic rate.   8/30 AM:  aPTT returned at 106 seconds HL 0.97 No signs of bleeding or issues with hep gtt   Goal of Therapy:  Heparin level 0.3-0.7 units/ml aPTT 66-102 seconds Monitor platelets by anticoagulation protocol: Yes   Plan:  Decrease heparin gtt to 1050 units/hr F/up aPTT in 8 hours (1730) Continue daily heparin level/aPTT until levels correlate, CBC, s/s bleeding F/U long-term anticoag plans    Doyle Kunath BS, PharmD, BCPS Clinical Pharmacist 07/28/2023 9:29 AM  Contact: [phone] after 3 PM  "Be curious, not judgmental..." -Debbora Dus

## 2023-07-28 NOTE — Progress Notes (Signed)
ANTICOAGULATION CONSULT NOTE- Follow Up Pharmacy Consult for heparin Indication: DVT and PE  Allergies  Allergen Reactions   Simvastatin Cough    Patient Measurements: Height: 5\' 2"  (157.5 cm) Weight: 84.5 kg (186 lb 4.6 oz) IBW/kg (Calculated) : 50.1 Heparin Dosing Weight: 78kg  Vital Signs: Temp: 98.5 F (36.9 C) (08/30 2001) Temp Source: Oral (08/30 2001) BP: 160/67 (08/30 2001) Pulse Rate: 62 (08/30 2001)  Labs: Recent Labs    07/26/23 0428 07/26/23 2131 07/27/23 0356 07/27/23 0805 07/28/23 0738 07/28/23 1957  HGB 8.4*  --  8.1*  --  8.0*  --   HCT 27.2*  --  26.3*  --  26.0*  --   PLT 177  --  198  --  208  --   APTT  --    < >  --  91* 106* 77*  HEPARINUNFRC  --   --  >1.10*  --  0.97*  --   CREATININE 2.28*  --  2.43*  --  2.30*  --    < > = values in this interval not displayed.    Estimated Creatinine Clearance: 19 mL/min (A) (by C-G formula based on SCr of 2.3 mg/dL (H)).  Assessment: 82 y.o. female with DVT and PE confirmed by VQ scan on heparin. Not on anticoagulation prior to admission.  Patient received 2 doses of apixaban 8/27- last dose 21:31. Patient developed a persistent nose bleed overnight and decision was made to restart patient on heparin. Hgb early this AM was stable at 8.4. Will restart at previously-therapeutic rate.   8/30 PM:  aPTT returned at 77 seconds No issues with heparin infusion or bleeding reported.   Goal of Therapy:  Heparin level 0.3-0.7 units/ml aPTT 66-102 seconds Monitor platelets by anticoagulation protocol: Yes   Plan:  Continue heparin gtt at 1050 units/hr F/up aPTT in 8 hours Continue daily heparin level/aPTT until levels correlate, CBC, s/s bleeding F/U long-term anticoag plans     Thank you for allowing pharmacy to be a part of this patient's care.   Signe Colt, PharmD 07/28/2023 9:25 PM  **Pharmacist phone directory can be found on [email or web address] listed under [facility]**

## 2023-07-29 ENCOUNTER — Other Ambulatory Visit (HOSPITAL_COMMUNITY): Payer: Self-pay

## 2023-07-29 DIAGNOSIS — R0600 Dyspnea, unspecified: Secondary | ICD-10-CM | POA: Diagnosis not present

## 2023-07-29 DIAGNOSIS — I82401 Acute embolism and thrombosis of unspecified deep veins of right lower extremity: Secondary | ICD-10-CM | POA: Diagnosis not present

## 2023-07-29 DIAGNOSIS — N179 Acute kidney failure, unspecified: Secondary | ICD-10-CM | POA: Diagnosis not present

## 2023-07-29 DIAGNOSIS — R0602 Shortness of breath: Secondary | ICD-10-CM | POA: Diagnosis not present

## 2023-07-29 LAB — CBC
HCT: 26.8 % — ABNORMAL LOW (ref 36.0–46.0)
Hemoglobin: 8 g/dL — ABNORMAL LOW (ref 12.0–15.0)
MCH: 22 pg — ABNORMAL LOW (ref 26.0–34.0)
MCHC: 29.9 g/dL — ABNORMAL LOW (ref 30.0–36.0)
MCV: 73.6 fL — ABNORMAL LOW (ref 80.0–100.0)
Platelets: 225 10*3/uL (ref 150–400)
RBC: 3.64 MIL/uL — ABNORMAL LOW (ref 3.87–5.11)
RDW: 16.8 % — ABNORMAL HIGH (ref 11.5–15.5)
WBC: 6.4 10*3/uL (ref 4.0–10.5)
nRBC: 0 % (ref 0.0–0.2)

## 2023-07-29 LAB — BASIC METABOLIC PANEL
Anion gap: 13 (ref 5–15)
BUN: 28 mg/dL — ABNORMAL HIGH (ref 8–23)
CO2: 21 mmol/L — ABNORMAL LOW (ref 22–32)
Calcium: 9.2 mg/dL (ref 8.9–10.3)
Chloride: 105 mmol/L (ref 98–111)
Creatinine, Ser: 2.32 mg/dL — ABNORMAL HIGH (ref 0.44–1.00)
GFR, Estimated: 20 mL/min — ABNORMAL LOW (ref >60)
Glucose, Bld: 96 mg/dL (ref 70–99)
Potassium: 4.5 mmol/L (ref 3.5–5.1)
Sodium: 139 mmol/L (ref 135–145)

## 2023-07-29 LAB — RETICULOCYTES
Immature Retic Fract: 20.9 % — ABNORMAL HIGH (ref 2.3–15.9)
RBC.: 3.6 MIL/uL — ABNORMAL LOW (ref 3.87–5.11)
Retic Count, Absolute: 61.6 10*3/uL (ref 19.0–186.0)
Retic Ct Pct: 1.7 % (ref 0.4–3.1)

## 2023-07-29 LAB — VITAMIN B12: Vitamin B-12: 315 pg/mL (ref 180–914)

## 2023-07-29 LAB — IRON AND TIBC
Iron: 35 ug/dL (ref 28–170)
Saturation Ratios: 16 % (ref 10.4–31.8)
TIBC: 223 ug/dL — ABNORMAL LOW (ref 250–450)
UIBC: 188 ug/dL

## 2023-07-29 LAB — FOLATE: Folate: 10.7 ng/mL (ref >5.9)

## 2023-07-29 LAB — FERRITIN: Ferritin: 124 ng/mL (ref 11–307)

## 2023-07-29 LAB — HEPARIN LEVEL (UNFRACTIONATED): Heparin Unfractionated: 0.5 [IU]/mL (ref 0.30–0.70)

## 2023-07-29 LAB — GLUCOSE, CAPILLARY
Glucose-Capillary: 85 mg/dL (ref 70–99)
Glucose-Capillary: 117 mg/dL — ABNORMAL HIGH (ref 70–99)
Glucose-Capillary: 87 mg/dL (ref 70–99)
Glucose-Capillary: 105 mg/dL — ABNORMAL HIGH (ref 70–99)

## 2023-07-29 LAB — APTT: aPTT: 61 s — ABNORMAL HIGH (ref 24–36)

## 2023-07-29 MED ORDER — SODIUM BICARBONATE 650 MG PO TABS
1300.0000 mg | ORAL_TABLET | Freq: Two times a day (BID) | ORAL | 3 refills | Status: AC
  Filled 2023-07-29: qty 120, 30d supply, fill #0

## 2023-07-29 MED ORDER — APIXABAN 5 MG PO TABS: 5.0000 mg | ORAL_TABLET | Freq: Two times a day (BID) | ORAL | 3 refills | Status: DC

## 2023-07-29 MED ORDER — SALINE SPRAY 0.65 % NA SOLN
1.0000 | NASAL | 3 refills | Status: AC | PRN
  Filled 2023-07-29: qty 44, 10d supply, fill #0

## 2023-07-29 MED ORDER — APIXABAN (ELIQUIS) VTE STARTER PACK (10MG AND 5MG): ORAL_TABLET | ORAL | 0 refills | Status: DC

## 2023-07-29 MED ORDER — APIXABAN 5 MG PO TABS
5.0000 mg | ORAL_TABLET | Freq: Two times a day (BID) | ORAL | 3 refills | Status: DC
  Filled 2023-07-29: qty 60, 30d supply, fill #0

## 2023-07-29 MED ORDER — APIXABAN 5 MG PO TABS: 5.0000 mg | ORAL_TABLET | Freq: Two times a day (BID) | ORAL | Status: DC

## 2023-07-29 MED ORDER — APIXABAN 5 MG PO TABS
ORAL_TABLET | ORAL | 0 refills | Status: DC
Start: 2023-07-30 — End: 2023-07-29

## 2023-07-29 MED ORDER — APIXABAN 5 MG PO TABS
5.0000 mg | ORAL_TABLET | Freq: Two times a day (BID) | ORAL | 3 refills | Status: DC
Start: 2023-07-29 — End: 2023-07-29
  Filled 2023-07-29: qty 60, 30d supply, fill #0

## 2023-07-29 MED ORDER — APIXABAN 5 MG PO TABS
ORAL_TABLET | ORAL | 0 refills | Status: DC
  Filled 2023-07-29: qty 64, 30d supply, fill #0

## 2023-07-29 MED ORDER — APIXABAN 5 MG PO TABS
10.0000 mg | ORAL_TABLET | Freq: Two times a day (BID) | ORAL | Status: DC
  Administered 2023-07-29 – 2023-07-30 (×3): 10 mg via ORAL
  Filled 2023-07-29 (×4): qty 2

## 2023-07-29 NOTE — Progress Notes (Addendum)
ANTICOAGULATION CONSULT NOTE  Pharmacy Consult for Eliquis Indication: pulmonary embolus  Allergies  Allergen Reactions   Simvastatin Cough    Patient Measurements: Height: 5\' 2"  (157.5 cm) Weight: 84.5 kg (186 lb 4.6 oz) IBW/kg (Calculated) : 50.1  Vital Signs: Temp: 98.1 F (36.7 C) (08/31 0342) Temp Source: Oral (08/31 0342) BP: 164/67 (08/31 0342) Pulse Rate: 60 (08/31 0342)  Labs: Recent Labs    07/27/23 0356 07/27/23 0805 07/28/23 0738 07/28/23 1957 07/29/23 0416  HGB 8.1*  --  8.0*  --  8.0*  HCT 26.3*  --  26.0*  --  26.8*  PLT 198  --  208  --  225  APTT  --    < > 106* 77* 61*  HEPARINUNFRC >1.10*  --  0.97*  --  0.50  CREATININE 2.43*  --  2.30*  --  2.32*   < > = values in this interval not displayed.    Estimated Creatinine Clearance: 18.9 mL/min (A) (by C-G formula based on SCr of 2.32 mg/dL (H)).   Medical History: Past Medical History:  Diagnosis Date   CKD (chronic kidney disease), stage III (HCC)    Diabetes mellitus without complication (HCC)    Type 2   Family history of adverse reaction to anesthesia    cousin didn't know he had sleep apnea and was on a ventilator for a month after   Hypertension    Sleep apnea    grandson has sleep apnea pt does not    Medications:  Scheduled:   atorvastatin  20 mg Oral QHS   cholecalciferol  2,000 Units Oral Daily   insulin aspart  0-5 Units Subcutaneous QHS   insulin aspart  0-9 Units Subcutaneous TID WC   labetalol  200 mg Oral BID   sodium bicarbonate  1,300 mg Oral BID   Infusions:   heparin 1,200 Units/hr (07/29/23 0615)   PRN: hydrALAZINE, mouth rinse, sodium chloride  Assessment: 82 y.o. female with DVT and PE confirmed by VQ scan on heparin. Not on anticoagulation prior to admission.   Patient received 2 doses of apixaban 8/27- last dose 21:31. Patient developed a persistent nose bleed overnight and decision was made to restart patient on heparin. Plan to transition back to Eliquis  today. Since patient received 2 doses of Eliquis (8/27) and has been therapeutic on heparin for 4 days, 3 days of loading dose regimen left. CBC stable - Hgb 8, plts 225.    Plan:  Discontinue heparin START apixaban 10 mg BID x 3 days, followed by 5 mg BID- administer first dose at same time heparin infusion is stopped.  Monitor for signs/symptoms of bleeding  Patient will need education prior to discharge   Roslyn Smiling, PharmD PGY1 Pharmacy Resident 07/29/2023 7:38 AM

## 2023-07-29 NOTE — Plan of Care (Signed)
  Problem: Education: Goal: Ability to describe self-care measures that may prevent or decrease complications (Diabetes Survival Skills Education) will improve Outcome: Progressing   Problem: Coping: Goal: Ability to adjust to condition or change in health will improve Outcome: Progressing   Problem: Fluid Volume: Goal: Ability to maintain a balanced intake and output will improve Outcome: Progressing   

## 2023-07-29 NOTE — Progress Notes (Signed)
ANTICOAGULATION CONSULT NOTE - Follow Up Consult  Pharmacy Consult for heparin Indication:  PE/DVT  Labs: Recent Labs    07/27/23 0356 07/27/23 0805 07/28/23 0738 07/28/23 1957 07/29/23 0416  HGB 8.1*  --  8.0*  --  8.0*  HCT 26.3*  --  26.0*  --  26.8*  PLT 198  --  208  --  225  APTT  --    < > 106* 77* 61*  HEPARINUNFRC >1.10*  --  0.97*  --  0.50  CREATININE 2.43*  --  2.30*  --  2.32*   < > = values in this interval not displayed.    Assessment: 82yo female subtherapeutic on heparin after one PTT at goal; no infusion issues or signs of bleeding per RN.  Goal of Therapy:  Heparin level 0.3-0.7 units/ml   Plan:  Increase heparin infusion by 2 units/kg/hr to 1200 units/hr. Check level in 8 hours.   Vernard Gambles, PharmD, BCPS 07/29/2023 6:03 AM

## 2023-07-29 NOTE — Progress Notes (Signed)
[facility]                                                                              Dana Fleming, is a 82 y.o. 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 Workup revealed right lower extremity DVT for which she was started on heparin drip. VQ scan was ordered for suspected PE. She was also found to have left upper lobe pneumonia along with acute kidney injury. She was started on IV fluids and antibiotics. Nephrology was consulted    Assessment & Plan    Principal Problem: Acute right DVT (deep venous thrombosis) (HCC), right upper lobe PE -Presented with worsening shortness of breath, venous Dopplers positive for RLE.  VQ scan showed possible right upper lobe PE -2D echo showed EF of 50 to 55% with G1 DD -Patient was initially started on heparin drip, then transitioned to Eliquis, subsequently heparin drip restarted due to worsening renal function and epistaxis.  -Discussed with nephrology, Dr. Ronalee Belts on 8/29, okay to do eliquis.  Patient prefers to DOAC's over Coumadin/Lovenox. -H&H stable, transitioned to apixaban today.  Will follow CBC tomorrow -Anemia panel reviewed, anemia of chronic disease likely CKD  Active problems   Epistaxis -Resolved, patient was restarted on heparin -No further epistaxis   Acute kidney injury on CKD stage IIIb -Baseline creatinine 1.5 from April 2024, creatinine was 1.8 on the day of discharge after nephrectomy -Presented with creatinine of 2.86, likely from poor p.o. intake.  Recent ultrasound with no hydronephrosis and left  kidney. -Continue sodium bicarb tablets -Patient was seen by nephrology, recommended outpatient follow-up, no need for dialysis. -Creatinine stable at 2.3   Community-acquired pneumonia, left upper lobe pneumonia -Completed IV Rocephin and doxycycline -RSV, COVID-19 negative  - stable, no hypoxia, on room air   Hypomagnesemia -Replete as needed   Hypertension -Continue labetalol -Amlodipine was discontinued as outpatient -BP stable   Hyperlipidemia -Continue statin   Microcytic anemia -Hemoglobin 8.0 -No further epistaxis or any bleeding. -Follow CBC in a.m.    Mildly positive troponin -Most likely from demand ischemia.  No chest pain.  2D echo as above.   -Aspirin is discontinued as patient is on anticoagulation.     Physical deconditioning -PT evaluation recommended home health PT   Obesity Estimated body mass index is 34.07 kg/m as calculated from the following:   Height as of this encounter: 5\' 2"  (1.575 m).   Weight as of this encounter: 84.5 kg.  Code Status: Full CODE STATUS DVT Prophylaxis:   apixaban (ELIQUIS) tablet 10 mg  apixaban (ELIQUIS) tablet 5 mg   Level of Care: Level of care: Telemetry Medical Family Communication: Updated patient's cousin and daughter on the phone in the room Disposition Plan:  Remains inpatient appropriate: Possible DC home tomorrow if H&H remained stable and no acute issues on Eliquis.  Prescriptions are sent to [facility] pharmacy   Procedures:  2D echo  Consultants:   Nephrology  Antimicrobials:   Anti-infectives (From admission, onward)    Start     Dose/Rate Route Frequency Ordered Stop   07/23/23 1700  cefTRIAXone (ROCEPHIN) 1 g in sodium chloride 0.9 % 100 mL IVPB        1 g 200 mL/hr over 30 Minutes Intravenous Every 24 hours 07/22/23 1746 07/26/23 1832   07/22/23 1800  metroNIDAZOLE (FLAGYL) IVPB 500 mg  Status:  Discontinued        500 mg 100 mL/hr over 60 Minutes Intravenous Every 12 hours 07/22/23 1746  07/23/23 1626   07/22/23 1600  cefTRIAXone (ROCEPHIN) 1 g in sodium chloride 0.9 % 100 mL IVPB        1 g 200 mL/hr over 30 Minutes Intravenous  Once 07/22/23 1548 07/22/23 1740   07/22/23 1600  doxycycline (VIBRAMYCIN) 100 mg in sodium chloride 0.9 % 250 mL IVPB        100 mg 125 mL/hr over 120 Minutes Intravenous Every 12 hours 07/22/23 1548 07/27/23 0947          Medications  apixaban  10 mg Oral BID   Followed by   Melene Muller ON 08/01/2023] apixaban  5 mg Oral BID   atorvastatin  20 mg Oral QHS   cholecalciferol  2,000 Units Oral Daily   insulin aspart  0-5 Units Subcutaneous QHS   insulin aspart  0-9 Units Subcutaneous TID WC   labetalol  200 mg Oral BID   sodium bicarbonate  1,300 mg Oral BID      Subjective:   Dana Fleming was seen and examined today.  No bleeding issues, no epistaxis, H&H stable, looking forward to transition to Eliquis today.  No BM today. Objective:   Vitals:   07/29/23 0342 07/29/23 0738 07/29/23 0844 07/29/23 1134  BP: (!) 164/67 (!) 164/56 (!) 170/65 (!) 141/65  Pulse: 60 64 65 60  Resp: 20 20  16   Temp: 98.1 F (36.7 C) 98.3 F (36.8 C)  98.2 F (36.8 C)  TempSrc: Oral Oral  Oral  SpO2: 99% 97%  98%  Weight: 84.5 kg     Height:        Intake/Output Summary (Last 24 hours) at 07/29/2023 1411 Last data filed at 07/29/2023 1001 Gross per 24 hour  Intake 830.82 ml  Output --  Net 830.82 ml     Wt Readings from Last 3 Encounters:  07/29/23 84.5 kg  03/29/23 82.6 kg  03/16/23 82.6 kg    Physical Exam General: Alert and oriented x 3, NAD Cardiovascular: S1 S2 clear, RRR.  Respiratory: CTAB Gastrointestinal: Soft, nontender, nondistended, NBS Ext: no pedal edema bilaterally Neuro: no new deficits Psych: Normal affect    Data Reviewed:  I have personally reviewed following labs    CBC Lab Results  Component Value Date   WBC 6.4 07/29/2023   RBC 3.64 (L) 07/29/2023   RBC 3.60 (L) 07/29/2023   HGB 8.0 (L) 07/29/2023   HCT  26.8 (L) 07/29/2023   MCV 73.6 (L) 07/29/2023   MCH 22.0 (L) 07/29/2023   PLT 225 07/29/2023   MCHC 29.9 (L) 07/29/2023   RDW 16.8 (H) 07/29/2023   LYMPHSABS 1.2 07/22/2023   MONOABS 0.8 07/22/2023   EOSABS 0.1 07/22/2023   BASOSABS 0.0 07/22/2023  Last metabolic panel Lab Results  Component Value Date   NA 139 07/29/2023   K 4.5 07/29/2023   CL 105 07/29/2023   CO2 21 (L) 07/29/2023   BUN 28 (H) 07/29/2023   CREATININE 2.32 (H) 07/29/2023   GLUCOSE 96 07/29/2023   GFRNONAA 20 (L) 07/29/2023   GFRAA 35 (L) 01/14/2016   CALCIUM 9.2 07/29/2023   PHOS 3.2 07/26/2023   PROT 6.2 (L) 07/23/2023   ALBUMIN 2.5 (L) 07/26/2023   BILITOT 0.8 07/23/2023   ALKPHOS 70 07/23/2023   AST 17 07/23/2023   ALT 14 07/23/2023   ANIONGAP 13 07/29/2023    CBG (last 3)  Recent Labs    07/28/23 2111 07/29/23 0602 07/29/23 1132  GLUCAP 74 85 117*      Coagulation Profile: No results for input(s): "INR", "PROTIME" in the last 168 hours.   Radiology Studies: I have personally reviewed the imaging studies  No results found.     Thad Ranger M.D. [facility] 07/29/2023, 2:11 PM  Available via Epic secure chat 7am-7pm After 7 pm, please refer to night coverage provider listed on amion.

## 2023-07-29 NOTE — TOC Transition Note (Signed)
DISCHARGE MEDS LOCATED IN TOC BLUE BIN

## 2023-07-30 DIAGNOSIS — R0600 Dyspnea, unspecified: Secondary | ICD-10-CM | POA: Diagnosis not present

## 2023-07-30 DIAGNOSIS — I82409 Acute embolism and thrombosis of unspecified deep veins of unspecified lower extremity: Secondary | ICD-10-CM | POA: Diagnosis not present

## 2023-07-30 DIAGNOSIS — I2609 Other pulmonary embolism with acute cor pulmonale: Secondary | ICD-10-CM

## 2023-07-30 DIAGNOSIS — I82401 Acute embolism and thrombosis of unspecified deep veins of right lower extremity: Secondary | ICD-10-CM | POA: Diagnosis not present

## 2023-07-30 DIAGNOSIS — E1122 Type 2 diabetes mellitus with diabetic chronic kidney disease: Secondary | ICD-10-CM

## 2023-07-30 LAB — CBC
HCT: 25.5 % — ABNORMAL LOW (ref 36.0–46.0)
Hemoglobin: 7.9 g/dL — ABNORMAL LOW (ref 12.0–15.0)
MCH: 22.2 pg — ABNORMAL LOW (ref 26.0–34.0)
MCHC: 31 g/dL (ref 30.0–36.0)
MCV: 71.6 fL — ABNORMAL LOW (ref 80.0–100.0)
Platelets: 224 10*3/uL (ref 150–400)
RBC: 3.56 MIL/uL — ABNORMAL LOW (ref 3.87–5.11)
RDW: 16.6 % — ABNORMAL HIGH (ref 11.5–15.5)
WBC: 5.6 10*3/uL (ref 4.0–10.5)
nRBC: 0 % (ref 0.0–0.2)

## 2023-07-30 LAB — BASIC METABOLIC PANEL
Anion gap: 8 (ref 5–15)
BUN: 27 mg/dL — ABNORMAL HIGH (ref 8–23)
CO2: 22 mmol/L (ref 22–32)
Calcium: 9.1 mg/dL (ref 8.9–10.3)
Chloride: 107 mmol/L (ref 98–111)
Creatinine, Ser: 2.42 mg/dL — ABNORMAL HIGH (ref 0.44–1.00)
GFR, Estimated: 19 mL/min — ABNORMAL LOW (ref >60)
Glucose, Bld: 91 mg/dL (ref 70–99)
Potassium: 4.8 mmol/L (ref 3.5–5.1)
Sodium: 137 mmol/L (ref 135–145)

## 2023-07-30 LAB — GLUCOSE, CAPILLARY
Glucose-Capillary: 94 mg/dL (ref 70–99)
Glucose-Capillary: 105 mg/dL — ABNORMAL HIGH (ref 70–99)

## 2023-07-30 MED ORDER — SODIUM CHLORIDE 0.9 % IV SOLN
510.0000 mg | Freq: Once | INTRAVENOUS | Status: AC
  Administered 2023-07-30: 510 mg via INTRAVENOUS
  Filled 2023-07-30: qty 17

## 2023-07-30 NOTE — TOC Transition Note (Signed)
Transition of Care [facility]) - CM/SW Discharge Note   Patient Details  Name: Dana Fleming MRN: [id number] Date of Birth: March 03, 1941  Transition of [facility]) CM/SW Contact:  Ronny Bacon, RN Phone Number: 07/30/2023, 11:53 AM   Clinical Narrative:  Patient is being discharged today. Spoke with patient by phone and is agreeable to [facility] therapy services and obtaining rollator. HH PT arranged through [facility]. Rollator arranged through [employer].     Final next level of care: Home w Home Health Services Barriers to Discharge: No Barriers Identified   Patient Goals and CMS Choice      Discharge Placement                         Discharge Plan and Services Additional resources added to the After Visit Summary for                  DME Arranged: Walker rolling with seat DME Agency: [employer] Date DME Agency Contacted: 07/30/23 Time DME Agency Contacted: 1152 Representative spoke with at DME Agency: Vaughan Basta HH Arranged: PT HH Agency: [facility] Date [facility] Agency Contacted: 07/30/23 Time HH Agency Contacted: 1152 Representative spoke with at [facility] Agency: Kandee Keen  Social Determinants of Health (SDOH) Interventions SDOH Screenings   Food Insecurity: No Food Insecurity (07/22/2023)  Housing: Low Risk  (07/22/2023)  Transportation Needs: No Transportation Needs (07/22/2023)  Utilities: Not At Risk (07/22/2023)  Tobacco Use: Medium Risk (07/24/2023)     Readmission Risk Interventions     No data to display

## 2023-07-30 NOTE — Progress Notes (Signed)
Mobility Specialist Progress Note    07/30/23 1119  Mobility  Activity Ambulated with assistance in hallway  Level of Assistance Contact guard assist, steadying assist  Assistive Device Other (Comment) (HHA)  Distance Ambulated (ft) 340 ft  Activity Response Tolerated well  Mobility Referral Yes  $Mobility charge 1 Mobility  Mobility Specialist Start Time (ACUTE ONLY) 1107  Mobility Specialist Stop Time (ACUTE ONLY) 1118  Mobility Specialist Time Calculation (min) (ACUTE ONLY) 11 min   Pre-Mobility: 60 HR  Pt received in bed and agreeable. No complaints on walk. Took x1 short standing rest break. Returned to bed with call bell in reach.   [place] Mobility Specialist  Please Neurosurgeon or Rehab Office at [phone]

## 2023-07-30 NOTE — Progress Notes (Signed)
Patient given discharge instructions medication list and follow up appointment. Patient has received medications from [facility] pharmacy prior to discharge. IV and tele were removed. Will discharge home as ordered. Prajwal Fellner, Randall An RN

## 2023-07-30 NOTE — Discharge Summary (Signed)
Physician Discharge Summary   Patient: Dana Fleming MRN: [id number] DOB: May 02, 1941  Admit date:     07/22/2023  Discharge date: 07/30/23  Discharge Physician: Thad Ranger, MD    PCP: [name], MD   Recommendations at discharge:   Patient started on eliquis starter pack 10 mg twice daily for 1 week then continue 5 mg twice daily. Ambulatory referral sent to vascular surgery for DVT clinic follow-up (discussed with Dr. 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 Workup revealed right lower extremity DVT for which she was started on heparin drip. VQ scan was ordered for suspected PE. She was also found to have left upper lobe pneumonia along with acute kidney injury. She was started on IV fluids and antibiotics. Nephrology was consulted    Assessment and Plan:  Acute right DVT (deep venous thrombosis) (HCC), right upper lobe PE -Presented with worsening shortness of breath, venous Dopplers positive for RLE.  VQ scan showed possible right upper lobe PE -2D echo showed EF of 50 to 55% with G1 DD -Patient was initially started on heparin drip, then transitioned to Eliquis, subsequently heparin drip restarted due to worsening renal function and epistaxis.  -Discussed with nephrology, Dr. Ronalee Belts on 8/29, okay to do eliquis.  Patient prefers to [facility]  over Coumadin/Lovenox. -Transitioned to apixaban on 07/29/2023.  Tolerated without any difficulty, no epistaxis or obvious bleeding. -Anemia panel reviewed, anemia of chronic disease likely CKD, borderline low iron, received IV iron x 1 prior to discharge -Vascular surgery consulted, discussed with Dr. Randie Heinz, patient will follow in the DVT clinic outpatient   Active problems   Epistaxis -Resolved, patient was restarted on heparin -No further epistaxis   Acute kidney injury on CKD stage IIIb -Baseline creatinine 1.5 from April 2024, creatinine was 1.8 on the day of discharge after nephrectomy -Presented with creatinine of 2.86, likely from poor p.o. intake.  Recent ultrasound with no hydronephrosis and left kidney. -Continue sodium bicarb tablets -Patient was seen by nephrology, recommended outpatient follow-up, no need for dialysis.  Creatinine at discharge 2.4.  Patient will follow-up with her nephrologist, Dr. Ronalee Belts.    Community-acquired pneumonia, left upper lobe pneumonia -Completed IV Rocephin and doxycycline -RSV, COVID-19 negative  - stable, no hypoxia, on room air   Hypomagnesemia -Replete as needed   Hypertension -Continue labetalol -BP stable   Hyperlipidemia -Continue statin   Subacute on chronic microcytic anemia -Anemia panel showed iron 35, TIBC 223, percent saturation ratio 16, ferritin 124, B12 315, folate 10.7 -Hemoglobin 7.9 at discharge, no obvious bleeding -Received IV iron x 1 prior to discharge     Mildly positive troponin -Most likely from demand ischemia.  No chest pain.  2D echo as above.   -Aspirin is discontinued as patient is on anticoagulation.     Physical deconditioning -PT evaluation recommended home health PT   Obesity Estimated body mass index is 34.07 kg/m as calculated from the following:  Height as of this encounter: 5\' 2"  (1.575 m).   Weight as of this encounter: 84.5 kg.    Pain control - [employer] Controlled  Substance Reporting System database was reviewed. and patient was instructed, not to drive, operate heavy machinery, perform activities at heights, swimming or participation in water activities or provide baby-sitting services while on Pain, Sleep and Anxiety Medications; until their outpatient Physician has advised to do so again. Also recommended to not to take more than prescribed Pain, Sleep and Anxiety Medications.  Consultants: Nephrology, vascular surgery, Dr. Randie Heinz (on the phone) Procedures performed:   Disposition: Home Diet recommendation:  Discharge Diet Orders (From admission, onward)     Start     Ordered   07/30/23 0000  Diet Carb Modified        07/30/23 1116            DISCHARGE MEDICATION: Allergies as of 07/30/2023       Reactions   Simvastatin Cough        Medication List     TAKE these medications    atorvastatin 20 MG tablet Commonly known as: LIPITOR Take 20 mg by mouth at bedtime.   cholecalciferol 25 MCG (1000 UNIT) tablet Commonly known as: VITAMIN D3 Take 2,000 Units by mouth daily.   Deep Sea Nasal Spray 0.65 % nasal spray Generic drug: sodium chloride Place 1 spray into both nostrils as needed for congestion (nose irritation).   Eliquis 5 MG Tabs tablet Generic drug: apixaban Take 2 tablets (10 mg total) by mouth 2 (two) times daily for 2 days, THEN 1 tablet (5 mg total) 2 (two) times daily for 28 days. Start taking on: July 30, 2023   apixaban 5 MG Tabs tablet Commonly known as: ELIQUIS Take 1 tablet (5 mg total) by mouth 2 (two) times daily. Start this prescription when the eliquis starter pack has been completed. Start taking on: August 28, 2023   labetalol 100 MG tablet Commonly known as: NORMODYNE Take 100 mg by mouth 2 (two) times daily.   sodium bicarbonate 650 MG tablet Take 2 tablets (1,300 mg total) by mouth 2 (two) times daily.               Durable Medical Equipment  (From admission, onward)            Start     Ordered   07/30/23 0824  For home use only DME 4 wheeled rolling walker with seat  Once       Question:  Patient needs a walker to treat with the following condition  Answer:  Gait instability   07/30/23 1324            Follow-up Information     [name], MD. Schedule an appointment as soon as possible for a visit in 2 week(s).   Specialty: Family Medicine Why: for hospital follow-up Contact information: [address] [place] [address] [phone]         Maeola Harman, MD. Schedule an appointment as soon as possible for a visit in 4 week(s).   Specialties: Vascular Surgery, Cardiology Why: for hospital follow-up.  Ambulatory referral to vascular surgery sent for the DVT clinic.  Please call the office next week to ensure follow-up scheduled for next month. Contact information: [address] [place] [address] [phone]                Discharge Exam: Ceasar Mons Weights   07/28/23 0329 07/29/23  1308 07/30/23 0432  Weight: 84.5 kg 84.5 kg 83.6 kg   S: No acute complaints, tolerated breakfast this morning, feels ready to go home today.  No epistaxis, hematochezia or melena.  Doing well.  BP (!) 174/83 (BP Location: Left Arm)   Pulse 60   Temp 98.3 F (36.8 C) (Oral)   Resp 19   Ht 5\' 2"  (1.575 m)   Wt 83.6 kg   SpO2 99%   BMI 33.69 kg/m   Physical Exam General: Alert and oriented x 3, NAD Cardiovascular: S1 S2 clear, RRR.  Respiratory: CTAB, no wheezing, rales or rhonchi Gastrointestinal: Soft, nontender, nondistended, NBS Ext: no pedal edema bilaterally Neuro: no new deficits Skin: No rashes Psych: Normal affect    Condition at discharge: fair  The results of significant diagnostics from this hospitalization (including imaging, microbiology, ancillary and laboratory) are listed below for reference.   Imaging Studies: DG CHEST PORT 1 VIEW  Result Date: 07/23/2023 CLINICAL DATA:  Respiratory  distress.  Cough. EXAM: PORTABLE CHEST 1 VIEW COMPARISON:  Radiograph and CT yesterday FINDINGS: Progressive left upper lobe opacity from yesterday. No new airspace disease. Normal heart size with stable mediastinal contours. No pneumothorax or developing pleural effusion. IMPRESSION: Progressive left upper lobe opacity from yesterday, typical of pneumonia. Electronically Signed   By: Narda Rutherford M.D.   On: 07/23/2023 21:15   ECHOCARDIOGRAM COMPLETE  Result Date: 07/23/2023    ECHOCARDIOGRAM REPORT   Patient Name:   [facility] Date of Exam: 07/23/2023 Medical Rec #:  [id number]   Height:       62.0 in Accession #:    [id number]  Weight:       180.6 lb Date of Birth:  [birth year]   BSA:          1.830 m Patient Age:    82 years    BP:           138/69 mmHg Patient Gender: F           HR:           74 bpm. Exam Location:  Inpatient Procedure: 3D Echo, 2D Echo, Cardiac Doppler and Color Doppler Indications:    Elevated Troponin  History:        Patient has no prior history of Echocardiogram examinations.                 Cardiomyopathy, P.E., Signs/Symptoms:Shortness of Breath; Risk                 Factors:Hypertension, Diabetes, Dyslipidemia, Former Smoker and                 Sleep Apnea.  Sonographer:    Aron Baba Referring Phys: [id number] ANDREW C CORE  Sonographer Comments: Image acquisition challenging due to respiratory motion. IMPRESSIONS  1. Left ventricular ejection fraction, by estimation, is 50 to 55%. The left ventricle has low normal function. The left ventricle has no regional wall motion abnormalities. Left ventricular diastolic parameters are consistent with Grade I diastolic dysfunction (impaired relaxation).  2. Right ventricular systolic function is normal. The right ventricular size is normal. There is moderately elevated pulmonary artery systolic pressure. The estimated right ventricular systolic pressure is 54.3 mmHg.  3. Left atrial size was mildly dilated.  4. The mitral valve is normal  in structure. No evidence of mitral valve regurgitation. No evidence of mitral stenosis.  5. Tricuspid valve regurgitation is mild to moderate.  6. The aortic valve was not well  visualized. Aortic valve regurgitation is not visualized. Aortic valve sclerosis is present, with no evidence of aortic valve stenosis.  7. The inferior vena cava is normal in size with greater than 50% respiratory variability, suggesting right atrial pressure of 3 mmHg. Comparison(s): No prior Echocardiogram. FINDINGS  Left Ventricle: Left ventricular ejection fraction, by estimation, is 50 to 55%. The left ventricle has low normal function. The left ventricle has no regional wall motion abnormalities. The left ventricular internal cavity size was normal in size. There is no left ventricular hypertrophy. Left ventricular diastolic parameters are consistent with Grade I diastolic dysfunction (impaired relaxation). Right Ventricle: The right ventricular size is normal. No increase in right ventricular wall thickness. Right ventricular systolic function is normal. There is moderately elevated pulmonary artery systolic pressure. The tricuspid regurgitant velocity is 3.58 m/s, and with an assumed right atrial pressure of 3 mmHg, the estimated right ventricular systolic pressure is 54.3 mmHg. Left Atrium: Left atrial size was mildly dilated. Right Atrium: Right atrial size was normal in size. Pericardium: There is no evidence of pericardial effusion. Mitral Valve: The mitral valve is normal in structure. No evidence of mitral valve regurgitation. No evidence of mitral valve stenosis. Tricuspid Valve: The tricuspid valve is normal in structure. Tricuspid valve regurgitation is mild to moderate. Aortic Valve: The aortic valve was not well visualized. Aortic valve regurgitation is not visualized. Aortic valve sclerosis is present, with no evidence of aortic valve stenosis. Pulmonic Valve: The pulmonic valve was not well visualized. Pulmonic valve  regurgitation is not visualized. Aorta: The aortic root and ascending aorta are structurally normal, with no evidence of dilitation. Venous: The inferior vena cava is normal in size with greater than 50% respiratory variability, suggesting right atrial pressure of 3 mmHg. IAS/Shunts: No atrial level shunt detected by color flow Doppler.  LEFT VENTRICLE PLAX 2D LVIDd:         4.00 cm   Diastology LVIDs:         2.90 cm   LV e' medial:    4.21 cm/s LV PW:         0.90 cm   LV E/e' medial:  12.3 LV IVS:        0.80 cm   LV e' lateral:   5.43 cm/s LVOT diam:     1.80 cm   LV E/e' lateral: 9.5 LV SV:         54 LV SV Index:   29 LVOT Area:     2.54 cm                           3D Volume EF:                          3D EF:        54 %                          LV EDV:       87 ml                          LV ESV:       41 ml                          LV SV:        47  ml RIGHT VENTRICLE RV S prime:     12.10 cm/s TAPSE (M-mode): 1.6 cm LEFT ATRIUM           Index        RIGHT ATRIUM           Index LA diam:      3.90 cm 2.13 cm/m   RA Area:     10.80 cm LA Vol (A2C): 39.2 ml 21.42 ml/m  RA Volume:   18.80 ml  10.27 ml/m LA Vol (A4C): 51.7 ml 28.25 ml/m  AORTIC VALVE LVOT Vmax:   102.00 cm/s LVOT Vmean:  66.000 cm/s LVOT VTI:    0.212 m  AORTA Ao Root diam: 3.10 cm Ao Asc diam:  3.10 cm MITRAL VALVE               TRICUSPID VALVE MV Area (PHT): 3.27 cm    TR Peak grad:   51.3 mmHg MV Decel Time: 232 msec    TR Vmax:        358.00 cm/s MR Peak grad: 5.8 mmHg MR Vmax:      120.00 cm/s  SHUNTS MV E velocity: 51.70 cm/s  Systemic VTI:  0.21 m MV A velocity: 66.50 cm/s  Systemic Diam: 1.80 cm MV E/A ratio:  0.78 Riley Lam MD Electronically signed by Riley Lam MD Signature Date/Time: 07/23/2023/3:33:20 PM    Final    NM Pulmonary Perfusion  Result Date: 07/23/2023 CLINICAL DATA:  Dyspnea for 3 weeks. EXAM: NUCLEAR MEDICINE PERFUSION LUNG SCAN TECHNIQUE: Perfusion images were obtained in multiple  projections after intravenous injection of radiopharmaceutical. Ventilation scans intentionally deferred if perfusion scan and chest x-ray adequate for interpretation during COVID 19 epidemic. RADIOPHARMACEUTICALS:  4.2 mCi Tc-69m MAA IV COMPARISON:  One-view chest x-ray and CT chest without contrast 07/22/2023 FINDINGS: A wedge-shaped filling defect corresponds to the anterior segment of the right upper lobe. No CT or chest x-ray correlate is present. A less well-defined defect in the left upper lobe corresponds to the documented airspace disease. IMPRESSION: Positive for pulmonary embolus in the right upper lobe. These results will be called to the ordering clinician or representative by the Radiologist Assistant, and communication documented in the PACS or [employer]. Electronically Signed   By: Marin Roberts M.D.   On: 07/23/2023 10:46   VAS [place] LOWER EXTREMITY VENOUS (DVT) (ONLY [facility] & [facility])  Result Date: 07/23/2023  Lower Venous DVT Study Patient Name:  [facility]  Date of Exam:   07/22/2023 Medical Rec #: [id number]    Accession #:    [id number] Date of Birth: 09/01/1941    Patient Gender: F Patient Age:   40 years Exam Location:  [facility] Procedure:      VAS [place] LOWER EXTREMITY VENOUS (DVT) Referring Phys: Arabella Merles --------------------------------------------------------------------------------  Indications: SOB.  Comparison Study: No prior study on file Performing Technologist: Sherren Kerns RVS  Examination Guidelines: A complete evaluation includes B-mode imaging, spectral Doppler, color Doppler, and power Doppler as needed of all accessible portions of each vessel. Bilateral testing is considered an integral part of a complete examination. Limited examinations for reoccurring indications may be performed as noted. The reflux portion of the exam is performed with the patient in reverse Trendelenburg.  +---------+---------------+---------+-----------+----------+--------------+  RIGHT    CompressibilityPhasicitySpontaneityPropertiesThrombus Aging +---------+---------------+---------+-----------+----------+--------------+ CFV      Full           Yes      Yes                                 +---------+---------------+---------+-----------+----------+--------------+  SFJ      Full                                                        +---------+---------------+---------+-----------+----------+--------------+ FV Prox  Full                                                        +---------+---------------+---------+-----------+----------+--------------+ FV Mid   Full                                                        +---------+---------------+---------+-----------+----------+--------------+ FV DistalFull                                                        +---------+---------------+---------+-----------+----------+--------------+ PFV      Full                                                        +---------+---------------+---------+-----------+----------+--------------+ POP      Full           Yes      Yes                                 +---------+---------------+---------+-----------+----------+--------------+ PTV      None                                         Acute          +---------+---------------+---------+-----------+----------+--------------+ PERO     None                                         Acute          +---------+---------------+---------+-----------+----------+--------------+   +---------+---------------+---------+-----------+----------+--------------+ LEFT     CompressibilityPhasicitySpontaneityPropertiesThrombus Aging +---------+---------------+---------+-----------+----------+--------------+ CFV      Full           Yes      Yes                                 +---------+---------------+---------+-----------+----------+--------------+ SFJ      Full                                                         +---------+---------------+---------+-----------+----------+--------------+  FV Prox  Full                                                        +---------+---------------+---------+-----------+----------+--------------+ FV Mid   Full                                                        +---------+---------------+---------+-----------+----------+--------------+ FV DistalFull                                                        +---------+---------------+---------+-----------+----------+--------------+ PFV      Full                                                        +---------+---------------+---------+-----------+----------+--------------+ POP      Full           Yes      Yes                                 +---------+---------------+---------+-----------+----------+--------------+ PTV      Full                                                        +---------+---------------+---------+-----------+----------+--------------+ PERO     Full                                                        +---------+---------------+---------+-----------+----------+--------------+     Summary: BILATERAL: -No evidence of popliteal cyst, bilaterally. RIGHT: - Findings consistent with acute deep vein thrombosis involving the right posterior tibial veins, and right peroneal veins.   LEFT: - There is no evidence of deep vein thrombosis in the lower extremity.  *See table(s) above for measurements and observations. Electronically signed by Coral Else MD on 07/23/2023 at 9:19:48 AM.    Final    US RENAL  Result Date: 07/22/2023 CLINICAL DATA:  Acute renal insufficiency.  Renal cell carcinoma. EXAM: RENAL / URINARY TRACT ULTRASOUND COMPLETE COMPARISON:  Renal ultrasound dated 10/04/2022 and abdominal MRI dated 02/27/2023. FINDINGS: Right Kidney: Nephrectomy. Left Kidney: Renal measurements: 10.2 x 4.7 x 6.1 cm = volume: 15 mL. Normal echogenicity.  No hydronephrosis or shadowing stone. Multiple cysts as seen on the MRI measure up to 3.1 cm in the lower pole. Bladder: Appears normal for degree of bladder distention. Other: None. IMPRESSION: 1. Right nephrectomy. 2. Multiple left renal cysts.  No hydronephrosis or shadowing stone. 3. Unremarkable  urinary bladder. Electronically Signed   By: Elgie Collard M.D.   On: 07/22/2023 19:06   CT Chest Wo Contrast  Result Date: 07/22/2023 CLINICAL DATA:  PE suspected versus pneumonia EXAM: CT CHEST WITHOUT CONTRAST TECHNIQUE: Multidetector CT imaging of the chest was performed following the standard protocol without IV contrast. RADIATION DOSE REDUCTION: This exam was performed according to the departmental dose-optimization program which includes automated exposure control, adjustment of the mA and/or kV according to patient size and/or use of iterative reconstruction technique. COMPARISON:  None Available. FINDINGS: Cardiovascular: Aortic atherosclerosis. Normal heart size. Enlargement of the main pulmonary artery measuring up to 3.7 cm in caliber. Three-vessel coronary artery calcifications. No pericardial effusion. Mediastinum/Nodes: No enlarged mediastinal, hilar, or axillary lymph nodes. Small hiatal hernia. Thyroid gland, trachea, and esophagus demonstrate no significant findings. Lungs/Pleura: Heterogeneous and consolidative airspace disease throughout the left upper lobe (series 4, image 28). No pleural effusion or pneumothorax. Upper Abdomen: No acute abnormality. Partially imaged right nephrectomy. Musculoskeletal: No chest wall abnormality. No acute osseous findings. IMPRESSION: 1. Heterogeneous and consolidative airspace disease throughout the left upper lobe, consistent with pneumonia. Recommend follow-up to resolution to exclude underlying neoplasm. 2. No meaningful evaluation for pulmonary embolism on this noncontrast examination. 3. Enlargement of the main pulmonary artery, as can be seen in  pulmonary hypertension. 4. Coronary artery disease. Aortic Atherosclerosis (ICD10-I70.0). Electronically Signed   By: Jearld Lesch M.D.   On: 07/22/2023 16:37   DG Chest Portable 1 View  Result Date: 07/22/2023 CLINICAL DATA:  Dyspnea for 3 weeks EXAM: PORTABLE CHEST 1 VIEW COMPARISON:  None Available. FINDINGS: Consolidative left upper lobe opacity. No pneumothorax, effusion or edema. Normal cardiopericardial silhouette. Calcified aorta. Right shoulder reverse arthroplasty. Overlapping cardiac leads. IMPRESSION: Left upper lung consolidative opacity. Possible pneumonia. However recommend follow-up to confirm resolution. Electronically Signed   By: Karen Kays M.D.   On: 07/22/2023 12:49    Microbiology: Results for orders placed or performed during the hospital encounter of 07/22/23  SARS Coronavirus 2 by RT PCR (hospital order, performed in [facility] hospital lab) *cepheid single result test* Anterior Nasal Swab     Status: None   Collection Time: 07/22/23 11:20 AM   Specimen: Anterior Nasal Swab  Result Value Ref Range Status   SARS Coronavirus 2 by RT PCR NEGATIVE NEGATIVE Final    Comment: Performed at [facility], [address] [place], [place] [address]  Respiratory (~20 pathogens) panel by PCR     Status: None   Collection Time: 07/22/23 11:20 AM   Specimen: Nasopharyngeal Swab; Respiratory  Result Value Ref Range Status   Adenovirus NOT DETECTED NOT DETECTED Final   Coronavirus 229E NOT DETECTED NOT DETECTED Final    Comment: (NOTE) The Coronavirus on the Respiratory Panel, DOES NOT test for the novel  Coronavirus (2019 nCoV)    Coronavirus HKU1 NOT DETECTED NOT DETECTED Final   Coronavirus NL63 NOT DETECTED NOT DETECTED Final   Coronavirus OC43 NOT DETECTED NOT DETECTED Final   Metapneumovirus NOT DETECTED NOT DETECTED Final   Rhinovirus / Enterovirus NOT DETECTED NOT DETECTED Final   Influenza A NOT DETECTED NOT DETECTED Final   Influenza B NOT DETECTED NOT  DETECTED Final   Parainfluenza Virus 1 NOT DETECTED NOT DETECTED Final   Parainfluenza Virus 2 NOT DETECTED NOT DETECTED Final   Parainfluenza Virus 3 NOT DETECTED NOT DETECTED Final   Parainfluenza Virus 4 NOT DETECTED NOT DETECTED Final   Respiratory Syncytial Virus NOT DETECTED NOT  DETECTED Final   Bordetella pertussis NOT DETECTED NOT DETECTED Final   Bordetella Parapertussis NOT DETECTED NOT DETECTED Final   Chlamydophila pneumoniae NOT DETECTED NOT DETECTED Final   Mycoplasma pneumoniae NOT DETECTED NOT DETECTED Final    Comment: Performed at [facility], [address], [place], [place] [address]    Labs: CBC: Recent Labs  Lab 07/26/23 [phone] 07/27/23 0356 07/28/23 0738 07/29/23 0416 07/30/23 0421  WBC 6.4 6.5 5.8 6.4 5.6  HGB 8.4* 8.1* 8.0* 8.0* 7.9*  HCT 27.2* 26.3* 26.0* 26.8* 25.5*  MCV 72.7* 72.3* 72.6* 73.6* 71.6*  PLT 177 198 208 225 224   Basic Metabolic Panel: Recent Labs  Lab 07/24/23 0758 07/25/23 0633 07/26/23 0428 07/27/23 0356 07/28/23 0738 07/29/23 0416 07/30/23 0421  NA 137 140 138 140 139 139 137  K 4.3 4.5 4.4 4.9 4.2 4.5 4.8  CL 113* 113* 111 112* 109 105 107  CO2 15* 18* 18* 20* 23 21* 22  GLUCOSE 101* 93 94 114* 93 96 91  BUN 38* 33* 35* 35* 28* 28* 27*  CREATININE 2.36* 2.46* 2.28* 2.43* 2.30* 2.32* 2.42*  CALCIUM 9.0 9.1 9.1 9.2 9.1 9.2 9.1  MG 1.7 1.6* 2.1  --   --   --   --   PHOS 3.0 3.6 3.2  --   --   --   --    Liver Function Tests: Recent Labs  Lab 07/24/23 0758 07/25/23 0633 07/26/23 0428  ALBUMIN 2.8* 2.5* 2.5*   CBG: Recent Labs  Lab 07/29/23 1132 07/29/23 1622 07/29/23 2141 07/30/23 0621 07/30/23 1126  GLUCAP 117* 87 105* 94 105*    Discharge time spent: greater than 30 minutes.  Signed: Thad Ranger, MD [facility] 07/30/2023

## 2023-08-01 ENCOUNTER — Other Ambulatory Visit (HOSPITAL_COMMUNITY): Payer: Self-pay

## 2023-08-03 ENCOUNTER — Encounter (HOSPITAL_COMMUNITY): Payer: Self-pay

## 2023-08-03 ENCOUNTER — Other Ambulatory Visit (HOSPITAL_COMMUNITY): Payer: Self-pay

## 2023-08-03 ENCOUNTER — Ambulatory Visit (HOSPITAL_COMMUNITY)
Admission: RE | Admit: 2023-08-03 | Discharge: 2023-08-03 | Disposition: A | Discharge status: Home / Self Care | Payer: Medicare Other | Source: Ambulatory Visit | Attending: Vascular Surgery | Admitting: Vascular Surgery

## 2023-08-03 VITALS — BP 136/52 | HR 63

## 2023-08-03 DIAGNOSIS — I82401 Acute embolism and thrombosis of unspecified deep veins of right lower extremity: Secondary | ICD-10-CM | POA: Insufficient documentation

## 2023-08-03 DIAGNOSIS — I2609 Other pulmonary embolism with acute cor pulmonale: Secondary | ICD-10-CM | POA: Insufficient documentation

## 2023-08-03 NOTE — Progress Notes (Addendum)
DVT Clinic Note  Name: Dana Fleming     MRN: [id number]     DOB: 09/13/1941     Sex: female  PCP: [name], MD  Today's Visit: Visit Information: Initial Visit  Referred to DVT Clinic by:  [facility] Discharge  - Dr. Isidoro Donning Referred to CPP by: Dr. Karin Lieu Reason for referral:  Chief Complaint  Patient presents with   DVT   HISTORY OF PRESENT ILLNESS: Dana Fleming is a 82 y.o. female with PMH T2DM, HLD, RCC s/p right radical nephrectomy in 03/2023, solitary left kidney with CKD, who presents after diagnosis of DVT and PE for medication management. She is accompanied today by her daughter and cousin. Reports she developed SOB in the few days prior to hospitalization 8/24-07/30/23. She had some pain in her right leg but her SOB was her primary concern. In the ED she was found to have acute DVT involving the right posterior tibial and peroneal veins. VQ scan was positive for pulmonary embolism. No right heart strain on echo. Also found to have pneumonia and treated with antibiotics. She was discharged on an Eliquis starter pack.   Today, the patient reports that her RLE pain has resolved. She reports having a nosebleed while on heparin in the hospital but has had no abnormal bruising or bleeding since hospital discharge on Eliquis. She is s/p right nephrectomy 03/29/23. Reports that after the surgery she was not able to participate in her exercise classes at the senior center like usual, but she was involved with physical therapy at the time. She reports she is not one to lie around all day and admits to moving more and sooner than she was supposed to after the surgery, moving around the house and going to the store. In the period of time when she reports being less active than usual, she was still up and moving every hour.   Positive Thrombotic Risk Factors: Other (comment), Older Age (surgery 03/29/23) Bleeding Risk Factors: Age >65 years, Anticoagulant therapy, Anemia  Negative  Thrombotic Risk Factors: Previous VTE, Recent surgery (within 3 months), Recent trauma (within 3 months), Recent admission to hospital with acute illness (within 3 months), Paralysis, paresis, or recent plaster cast immobilization of lower extremity, Central venous catheterization, Bed rest >72 hours within 3 months, Sedentary journey lasting >8 hours within 4 weeks, Pregnancy, Within 6 weeks postpartum, Recent cesarean section (within 3 months), Estrogen therapy, Testosterone therapy, Erythropoiesis-stimulating agent, Recent COVID diagnosis (within 3 months), Active cancer, Non-malignant, chronic inflammatory condition, Known thrombophilic condition, Smoking, Obesity  Rx Insurance Coverage: Medicare Rx Affordability: Eliquis is $30 for a 30 day supply or $60 for a 90 day supply. Preferred Pharmacy: [facility] by the [facility]ist at discharge.   Past Medical History:  Diagnosis Date   CKD (chronic kidney disease), stage III (HCC)    Diabetes mellitus without complication (HCC)    Type 2   Family history of adverse reaction to anesthesia    cousin didn't know he had sleep apnea and was on a ventilator for a month after   Hypertension    Sleep apnea    grandson has sleep apnea pt does not    Past Surgical History:  Procedure Laterality Date   CLOSED REDUCTION SHOULDER DISLOCATION     had conscious sedation   EYE SURGERY Bilateral    cataract surgery with lens implant   MULTIPLE TOOTH EXTRACTIONS     ROBOT ASSISTED LAPAROSCOPIC NEPHRECTOMY Right 03/29/2023  Procedure: XI ROBOTIC ASSISTED LAPAROSCOPIC NEPHRECTOMY;  Surgeon: Loletta Parish., MD;  Location: [facility];  Service: Urology;  Laterality: Right;  3 HRS   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   Procedure: REVERSE TOTAL SHOULDER ARTHROPLASTY;  Surgeon: Cammy Copa, MD;  Location: [facility] OR;  Service: Orthopedics;  Laterality: Right;  REVERSE TOTAL SHOULDER ARTHROPLASTY    Social History    Socioeconomic History   Marital status: Single    Spouse name: Not on file   Number of children: Not on file   Years of education: Not on file   Highest education level: Not on file  Occupational History   Not on file  Tobacco Use   Smoking status: Former    Current packs/day: 0.00    Types: Cigarettes    Quit date: 01/13/1991    Years since quitting: 32.5   Smokeless tobacco: Never  Vaping Use   Vaping status: Never Used  Substance and Sexual Activity   Alcohol use: Yes    Comment: occasional   Drug use: No   Sexual activity: Not on file  Other Topics Concern   Not on file  Social History Narrative   Not on file   Social Determinants of Health   Financial Resource Strain: Not on file  Food Insecurity: No Food Insecurity (07/22/2023)   Hunger Vital Sign    Worried About Running Out of Food in the Last Year: Never true    Ran Out of Food in the Last Year: Never true  Transportation Needs: No Transportation Needs (07/22/2023)   PRAPARE - Administrator, Civil Service (Medical): No    Lack of Transportation (Non-Medical): No  Physical Activity: Not on file  Stress: Not on file  Social Connections: Not on file  Intimate Partner Violence: Not At Risk (07/22/2023)   Humiliation, Afraid, Rape, and Kick questionnaire    Fear of Current or Ex-Partner: No    Emotionally Abused: No    Physically Abused: No    Sexually Abused: No    Family History  Problem Relation Age of Onset   Stroke Mother    Diabetes Mother    Diabetes Father    Heart attack Father     Allergies as of 08/03/2023 - Review Complete 08/03/2023  Allergen Reaction Noted   Simvastatin Cough 07/22/2023    Current Outpatient Medications on File Prior to Encounter  Medication Sig Dispense Refill   apixaban (ELIQUIS) 5 MG TABS tablet Take 2 tablets (10 mg total) by mouth 2 (two) times daily for 2 days, THEN 1 tablet (5 mg total) 2 (two) times daily for 28 days. 64 tablet 0   atorvastatin  (LIPITOR) 20 MG tablet Take 20 mg by mouth at bedtime.  0   cholecalciferol (VITAMIN D3) 25 MCG (1000 UNIT) tablet Take 2,000 Units by mouth daily.     dapagliflozin propanediol (FARXIGA) 5 MG TABS tablet Take 5 mg by mouth daily.     labetalol (NORMODYNE) 100 MG tablet Take 100 mg by mouth 2 (two) times daily.     OVER THE COUNTER MEDICATION Takes tart cherry and ginger over the counter     sodium bicarbonate 650 MG tablet Take 2 tablets (1,300 mg total) by mouth 2 (two) times daily. 120 tablet 3   sodium chloride (OCEAN) 0.65 % SOLN nasal spray Place 1 spray into both nostrils as needed for congestion (nose irritation). 44 mL 3   [START ON 08/28/2023] apixaban (ELIQUIS) 5 MG TABS tablet  Take 1 tablet (5 mg total) by mouth 2 (two) times daily. Start this prescription when the eliquis starter pack has been completed. 60 tablet 3   No current facility-administered medications on file prior to encounter.   REVIEW OF SYSTEMS:  Review of Systems  Respiratory:  Negative for shortness of breath.   Cardiovascular:  Negative for chest pain, palpitations and leg swelling.  Musculoskeletal:  Negative for myalgias.  Neurological:  Negative for dizziness and tingling.   PHYSICAL EXAMINATION:  Vitals:   08/03/23 1107  BP: (!) 136/52  Pulse: 63  SpO2: 100%   Physical Exam Vitals reviewed.  Cardiovascular:     Rate and Rhythm: Normal rate.  Pulmonary:     Effort: Pulmonary effort is normal.  Musculoskeletal:        General: Swelling (1+ edema bilaterally) present. No tenderness.  Skin:    Findings: No bruising or erythema.  Psychiatric:        Mood and Affect: Mood normal.        Behavior: Behavior normal.        Thought Content: Thought content normal.   Villalta Score for Post-Thrombotic Syndrome: Pain: Absent Cramps: Absent Heaviness: Absent Paresthesia: Absent Pruritus: Absent Pretibial Edema: Mild Skin Induration: Absent Hyperpigmentation: Absent Redness: Absent Venous Ectasia:  Absent Pain on calf compression: Absent Villalta Preliminary Score: 1 Is venous ulcer present?: No If venous ulcer is present and score is <15, then 15 points total are assigned: Absent Villalta Total Score: 1  LABS:  CBC     Component Value Date/Time   WBC 5.6 07/30/2023 0421   RBC 3.56 (L) 07/30/2023 0421   HGB 7.9 (L) 07/30/2023 0421   HCT 25.5 (L) 07/30/2023 0421   PLT 224 07/30/2023 0421   MCV 71.6 (L) 07/30/2023 0421   MCH 22.2 (L) 07/30/2023 0421   MCHC 31.0 07/30/2023 0421   RDW 16.6 (H) 07/30/2023 0421   LYMPHSABS 1.2 07/22/2023 1108   MONOABS 0.8 07/22/2023 1108   EOSABS 0.1 07/22/2023 1108   BASOSABS 0.0 07/22/2023 1108    Hepatic Function      Component Value Date/Time   PROT 6.2 (L) 07/23/2023 0422   ALBUMIN 2.5 (L) 07/26/2023 0428   AST 17 07/23/2023 0422   ALT 14 07/23/2023 0422   ALKPHOS 70 07/23/2023 0422   BILITOT 0.8 07/23/2023 0422    Renal Function   Lab Results  Component Value Date   CREATININE 2.42 (H) 07/30/2023   CREATININE 2.32 (H) 07/29/2023   CREATININE 2.30 (H) 07/28/2023    Estimated Creatinine Clearance: 18 mL/min (A) (by C-G formula based on SCr of 2.42 mg/dL (H)).   [facility]VS Vascular Lab Studies:  07/22/23 VAS [place] LOWER EXTREMITY VENOUS BILAT (DVT)  Summary:  BILATERAL:  -No evidence of popliteal cyst, bilaterally.  RIGHT:  - Findings consistent with acute deep vein thrombosis involving the right  posterior tibial veins, and right peroneal veins.   07/23/23 VQ scan IMPRESSION: Positive for pulmonary embolus in the right upper lobe.  ASSESSMENT: Dana Fleming was recently diagnosed with an acute DVT involving the right posterior tibial and peroneal veins as well as pulmonary embolism. She is s/p nephrectomy 03/29/23 and was hospitalized 1 night at that time, but DVT/PE symptom onset was not until middle of August so it seems less likely to be related to this. She also seems to have remained quite active after the surgery and in the past few  months. Given that there are no strong provoking risk factors for the  DVT and PE, I talked with Dr. Karin Lieu who agreed with referring the patient to hematology for further recommendations on duration of anticoagulation. The patient is agreeable to this. She also mentions she had an IV iron infusion and has not had any follow up for anemia yet but does see her nephrologist Dr. Ronalee Belts tomorrow. She is tolerating Eliquis treatment well with no adverse effects. She is taking it correctly and spacing her doses out 12 hours. No concerns with medication adherence or access. We discussed that a 90 day supply will be more affordable on her insurance than getting a 30 day supply every month, so she is going to ask the pharmacy to convert her refills to a 90 day fill. She has refills available at her pharmacy.   PLAN: -Continue apixaban (Eliquis) 5 mg twice daily. -Expected duration of therapy: per hematology. Therapy started on 07/22/23. -Patient educated on purpose, proper use and potential adverse effects of apixaban (Eliquis). -Discussed importance of taking medication around the same time every day. -Advised patient of medications to avoid (NSAIDs, aspirin doses >100 mg daily). -Educated that Tylenol (acetaminophen) is the preferred analgesic to lower the risk of bleeding. -Advised patient to alert all providers of anticoagulation therapy prior to starting a new medication or having a procedure. -Emphasized importance of monitoring for signs and symptoms of bleeding (abnormal bruising, prolonged bleeding, nose bleeds, bleeding from gums, discolored urine, black tarry stools). -Educated patient to present to the ED if emergent signs and symptoms of new thrombosis occur. -Counseled patient to wear compression stockings daily, removing at night. Elevate legs daily to help improve swelling.  Follow up: Referral to hematology placed. DVT Clinic as needed.   Pervis Hocking, PharmD, [place], CPP Deep Vein  Thrombosis Clinic Clinical Pharmacist Practitioner Office: [phone]  I have evaluated the patient's chart/imaging and refer this patient to the Clinical Pharmacist Practitioner for medication management. I have reviewed the CPP's documentation and agree with her assessment and plan. I was immediately available during the visit for questions and collaboration.   Victorino Sparrow, MD

## 2023-08-03 NOTE — Patient Instructions (Signed)
-  Continue apixaban (Eliquis) 5 mg twice daily. -[facility] You may need to call the pharmacy to ask them to fill this when you start to run low on your current supply. Be sure to ask if they can fill it as a 90 day supply instead of a 30 day supply. Your insurance covers Eliquis $30 for a 30 day supply or $60 for a 90 day supply.  -We have referred you to a hematologist to help decide how long you need to be on treatment.  -It is important to take your medication around the same time every day.  -Avoid NSAIDs like ibuprofen (Advil, Motrin) and naproxen (Aleve) as well as aspirin doses over 100 mg daily. -Tylenol (acetaminophen) is the preferred over the counter pain medication to lower the risk of bleeding. -Be sure to alert all of your health care providers that you are taking an anticoagulant prior to starting a new medication or having a procedure. -Monitor for signs and symptoms of bleeding (abnormal bruising, prolonged bleeding, nose bleeds, bleeding from gums, discolored urine, black tarry stools). If you have fallen and hit your head OR if your bleeding is severe or not stopping, seek emergency care.  -Go to the emergency room if emergent signs and symptoms of new clot occur (new or worse swelling and pain in an arm or leg, shortness of breath, chest pain, fast or irregular heartbeats, lightheadedness, dizziness, fainting, coughing up blood) or if you experience a significant color change (pale or blue) in the extremity that has the DVT.  -Elevate your legs (feet above heart) to help with swelling.   If you have any questions or need to reschedule an appointment, please call [phone] [facility].  If you are having an emergency, call 911 or present to the nearest emergency room.   What is a DVT?  -Deep vein thrombosis (DVT) is a condition in which a blood clot forms in a vein of the deep venous system which can occur in the lower leg, thigh, pelvis, arm, or neck.  This condition is serious and can be life-threatening if the clot travels to the arteries of the lungs and causing a blockage (pulmonary embolism, PE). A DVT can also damage veins in the leg, which can lead to long-term venous disease, leg pain, swelling, discoloration, and ulcers or sores (post-thrombotic syndrome).  -Treatment may include taking an anticoagulant medication to prevent more clots from forming and the current clot from growing, wearing compression stockings, and/or surgical procedures to remove or dissolve the clot.

## 2023-08-04 ENCOUNTER — Other Ambulatory Visit (HOSPITAL_COMMUNITY): Payer: Self-pay

## 2023-08-04 DIAGNOSIS — N184 Chronic kidney disease, stage 4 (severe): Secondary | ICD-10-CM | POA: Diagnosis not present

## 2023-08-04 DIAGNOSIS — E875 Hyperkalemia: Secondary | ICD-10-CM | POA: Diagnosis not present

## 2023-08-04 DIAGNOSIS — R609 Edema, unspecified: Secondary | ICD-10-CM | POA: Diagnosis not present

## 2023-08-04 DIAGNOSIS — I82409 Acute embolism and thrombosis of unspecified deep veins of unspecified lower extremity: Secondary | ICD-10-CM | POA: Diagnosis not present

## 2023-08-04 DIAGNOSIS — E1122 Type 2 diabetes mellitus with diabetic chronic kidney disease: Secondary | ICD-10-CM | POA: Diagnosis not present

## 2023-08-04 DIAGNOSIS — I129 Hypertensive chronic kidney disease with stage 1 through stage 4 chronic kidney disease, or unspecified chronic kidney disease: Secondary | ICD-10-CM | POA: Diagnosis not present

## 2023-08-04 DIAGNOSIS — D631 Anemia in chronic kidney disease: Secondary | ICD-10-CM | POA: Diagnosis not present

## 2023-08-04 DIAGNOSIS — N2889 Other specified disorders of kidney and ureter: Secondary | ICD-10-CM | POA: Diagnosis not present

## 2023-08-04 DIAGNOSIS — N189 Chronic kidney disease, unspecified: Secondary | ICD-10-CM | POA: Diagnosis not present

## 2023-08-14 DIAGNOSIS — Z85528 Personal history of other malignant neoplasm of kidney: Secondary | ICD-10-CM | POA: Diagnosis not present

## 2023-08-14 DIAGNOSIS — Z1331 Encounter for screening for depression: Secondary | ICD-10-CM | POA: Diagnosis not present

## 2023-08-14 DIAGNOSIS — I82409 Acute embolism and thrombosis of unspecified deep veins of unspecified lower extremity: Secondary | ICD-10-CM | POA: Diagnosis not present

## 2023-08-14 DIAGNOSIS — E1121 Type 2 diabetes mellitus with diabetic nephropathy: Secondary | ICD-10-CM | POA: Diagnosis not present

## 2023-08-14 DIAGNOSIS — I10 Essential (primary) hypertension: Secondary | ICD-10-CM | POA: Diagnosis not present

## 2023-08-14 DIAGNOSIS — Z0000 Encounter for general adult medical examination without abnormal findings: Secondary | ICD-10-CM | POA: Diagnosis not present

## 2023-08-14 DIAGNOSIS — E7800 Pure hypercholesterolemia, unspecified: Secondary | ICD-10-CM | POA: Diagnosis not present

## 2023-08-14 DIAGNOSIS — N184 Chronic kidney disease, stage 4 (severe): Secondary | ICD-10-CM | POA: Diagnosis not present

## 2023-08-14 DIAGNOSIS — N2581 Secondary hyperparathyroidism of renal origin: Secondary | ICD-10-CM | POA: Diagnosis not present

## 2023-08-15 ENCOUNTER — Other Ambulatory Visit: Payer: Self-pay | Admitting: Family Medicine

## 2023-08-15 DIAGNOSIS — E2839 Other primary ovarian failure: Secondary | ICD-10-CM

## 2023-08-22 ENCOUNTER — Ambulatory Visit (INDEPENDENT_AMBULATORY_CARE_PROVIDER_SITE_OTHER): Payer: Medicare Other | Admitting: Physician Assistant

## 2023-08-22 ENCOUNTER — Encounter: Payer: Self-pay | Admitting: Physician Assistant

## 2023-08-22 DIAGNOSIS — M1711 Unilateral primary osteoarthritis, right knee: Secondary | ICD-10-CM

## 2023-08-22 MED ORDER — METHYLPREDNISOLONE ACETATE 40 MG/ML IJ SUSP
60.0000 mg | INTRAMUSCULAR | Status: AC | PRN
Start: 2023-08-22 — End: 2023-08-22
  Administered 2023-08-22: 60 mg via INTRA_ARTICULAR

## 2023-08-22 MED ORDER — BUPIVACAINE HCL 0.25 % IJ SOLN
2.0000 mL | INTRAMUSCULAR | Status: AC | PRN
Start: 2023-08-22 — End: 2023-08-22
  Administered 2023-08-22: 2 mL via INTRA_ARTICULAR

## 2023-08-22 MED ORDER — LIDOCAINE HCL 1 % IJ SOLN
2.0000 mL | INTRAMUSCULAR | Status: AC | PRN
Start: 2023-08-22 — End: 2023-08-22
  Administered 2023-08-22: 2 mL

## 2023-08-22 NOTE — Progress Notes (Signed)
Office Visit Note   Patient: Dana Fleming           Date of Birth: [birth year]           MRN: [id number] Visit Date: 08/22/2023              Requested by: [name], MD [phone] [address] [place],  [place] [address] PCP: [name], MD  Right knee pain    HPI: Patient is a pleasant 82 year old woman with a history of right knee arthritis I have injected her in the past.  She was doing very well and thought the injection was quite helpful.  She was also working on maintaining strengthening in her lower extremities.  Unfortunately a couple months ago she came up with a DVT and PE.  No known contributing factors.  She was down for a while and thinks this caused her to have her knee pain because she was not able to workout return.  Requesting an injection today no fever chills or particular injury rates her pain is moderate  Assessment & Plan: Visit Diagnoses: Right knee pain  Plan: Her exam is benign she has no pain in her calf negative Denna Haggard' sign she is on Eliquis prophylactically.  Will go forward with an injection today may follow-up as needed  Follow-Up Instructions: Return if symptoms worsen or fail to improve.   Ortho Exam  Patient is alert, oriented, no adenopathy, well-dressed, normal affect, normal respiratory effort. Right knee no erythema no effusion compartments are soft and compressible negative Homans' sign.  She is neurovascularly intact  Imaging: No results found. No images are attached to the encounter.  Labs: Lab Results  Component Value Date   HGBA1C 6.1 (H) 03/16/2023   HGBA1C 7.4 (H) 01/19/2016   REPTSTATUS 01/15/2016 FINAL 01/14/2016   CULT MULTIPLE SPECIES PRESENT, SUGGEST RECOLLECTION 01/14/2016     Lab Results  Component Value Date   ALBUMIN 2.5 (L) 07/26/2023   ALBUMIN 2.5 (L) 07/25/2023   ALBUMIN 2.8 (L) 07/24/2023    Lab Results  Component Value Date   MG 2.1 07/26/2023   MG 1.6 (L) 07/25/2023   MG 1.7 07/24/2023   No  results found for: "VD25OH"  No results found for: "PREALBUMIN"    Latest Ref Rng & Units 07/30/2023    4:21 AM 07/29/2023    4:16 AM 07/28/2023    7:38 AM  CBC EXTENDED  WBC 4.0 - 10.5 K/uL 5.6  6.4  5.8   RBC 3.87 - 5.11 MIL/uL 3.56  3.64    3.60  3.58   Hemoglobin 12.0 - 15.0 g/dL 7.9  8.0  8.0   HCT 62.9 - 46.0 % 25.5  26.8  26.0   Platelets 150 - 400 K/uL 224  225  208      There is no height or weight on file to calculate BMI.  Orders:  No orders of the defined types were placed in this encounter.  No orders of the defined types were placed in this encounter.    Procedures: Large Joint Inj on 08/22/2023 9:36 AM Indications: pain and diagnostic evaluation Details: 25 G 1.5 in needle, anteromedial approach  Arthrogram: No  Medications: 60 mg methylPREDNISolone acetate 40 MG/ML; 2 mL lidocaine 1 %; 2 mL bupivacaine 0.25 % Outcome: tolerated well, no immediate complications Procedure, treatment alternatives, risks and benefits explained, specific risks discussed. Consent was given by the patient.    Clinical Data: No additional findings.  ROS:  All other  systems negative, except as noted in the HPI. Review of Systems  Objective: Vital Signs: There were no vitals taken for this visit.  Specialty Comments:  No specialty comments available.  PMFS History: Patient Active Problem List   Diagnosis Date Noted  . DVT (deep venous thrombosis) (HCC) 07/22/2023  . Acute pulmonary embolism (HCC) 07/22/2023  . Shortness of breath 07/22/2023  . AKI (acute kidney injury) (HCC) 07/22/2023  . Non-ischemic myocardial injury (non-traumatic) 07/22/2023  . Essential hypertension 07/22/2023  . Metabolic acidosis 07/22/2023  . CAP (community acquired pneumonia) 07/22/2023  . Type 2 diabetes mellitus with chronic kidney disease, without long-term current use of insulin (HCC) 07/22/2023  . Hyperlipidemia 07/22/2023  . Renal mass 03/29/2023  . 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 CKD (chronic kidney disease), stage III (HCC)   . Diabetes mellitus without complication (HCC)    Type 2  . Family history of adverse reaction to anesthesia    cousin didn't know he had sleep apnea and was on a ventilator for a month after  . Hypertension   . Sleep apnea    grandson has sleep apnea pt does not    Family History  Problem Relation Age of Onset  . Stroke Mother   . Diabetes Mother   . Diabetes Father   . Heart attack Father     Past Surgical History:  Procedure Laterality Date  . CLOSED REDUCTION SHOULDER DISLOCATION     had conscious sedation  . EYE SURGERY Bilateral    cataract surgery with lens implant  . MULTIPLE TOOTH EXTRACTIONS    . ROBOT ASSISTED LAPAROSCOPIC NEPHRECTOMY Right 03/29/2023   Procedure: XI ROBOTIC ASSISTED LAPAROSCOPIC NEPHRECTOMY;  Surgeon: Loletta Parish., MD;  Location: [facility];  Service: Urology;  Laterality: Right;  3 HRS  . 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   Procedure: REVERSE TOTAL SHOULDER ARTHROPLASTY;  Surgeon: Cammy Copa, MD;  Location[facility] OR;  Service: Orthopedics;  Laterality: Right;  REVERSE TOTAL SHOULDER ARTHROPLASTY   Social History   Occupational History  . Not on file  Tobacco Use  . Smoking status: Former    Current packs/day: 0.00    Types: Cigarettes    Quit date: 01/13/1991    Years since quitting: 32.6  . Smokeless tobacco: Never  Vaping Use  . Vaping status: Never Used  Substance and Sexual Activity  . Alcohol use: Yes    Comment: occasional  . Drug use: No  . Sexual activity: Not on file

## 2023-08-28 ENCOUNTER — Other Ambulatory Visit (HOSPITAL_COMMUNITY): Payer: Self-pay

## 2023-08-29 ENCOUNTER — Other Ambulatory Visit (HOSPITAL_COMMUNITY): Payer: Self-pay

## 2023-09-11 ENCOUNTER — Telehealth: Payer: Self-pay | Admitting: Hematology and Oncology

## 2023-09-11 NOTE — Telephone Encounter (Signed)
Patient called requesting to reschedule her appointment with Dr. Al Pimple on 10/15.   Routing to appropriate campus to follow/up.

## 2023-09-12 ENCOUNTER — Inpatient Hospital Stay: Payer: Medicare Other

## 2023-09-12 ENCOUNTER — Inpatient Hospital Stay: Payer: Medicare Other | Attending: Hematology and Oncology | Admitting: Hematology and Oncology

## 2023-09-12 ENCOUNTER — Other Ambulatory Visit: Payer: Medicare Other

## 2023-09-12 DIAGNOSIS — I82451 Acute embolism and thrombosis of right peroneal vein: Secondary | ICD-10-CM | POA: Insufficient documentation

## 2023-09-12 DIAGNOSIS — I129 Hypertensive chronic kidney disease with stage 1 through stage 4 chronic kidney disease, or unspecified chronic kidney disease: Secondary | ICD-10-CM | POA: Insufficient documentation

## 2023-09-12 DIAGNOSIS — N183 Chronic kidney disease, stage 3 unspecified: Secondary | ICD-10-CM | POA: Insufficient documentation

## 2023-09-12 DIAGNOSIS — Z905 Acquired absence of kidney: Secondary | ICD-10-CM | POA: Insufficient documentation

## 2023-09-12 DIAGNOSIS — C649 Malignant neoplasm of unspecified kidney, except renal pelvis: Secondary | ICD-10-CM | POA: Insufficient documentation

## 2023-09-12 DIAGNOSIS — E1122 Type 2 diabetes mellitus with diabetic chronic kidney disease: Secondary | ICD-10-CM | POA: Insufficient documentation

## 2023-09-12 DIAGNOSIS — I82441 Acute embolism and thrombosis of right tibial vein: Secondary | ICD-10-CM | POA: Insufficient documentation

## 2023-09-12 DIAGNOSIS — Z87891 Personal history of nicotine dependence: Secondary | ICD-10-CM | POA: Insufficient documentation

## 2023-09-21 ENCOUNTER — Other Ambulatory Visit: Payer: Self-pay

## 2023-09-21 ENCOUNTER — Inpatient Hospital Stay: Payer: Medicare Other

## 2023-09-21 ENCOUNTER — Inpatient Hospital Stay (HOSPITAL_BASED_OUTPATIENT_CLINIC_OR_DEPARTMENT_OTHER): Payer: Medicare Other | Admitting: Hematology

## 2023-09-21 DIAGNOSIS — Z905 Acquired absence of kidney: Secondary | ICD-10-CM

## 2023-09-21 DIAGNOSIS — Z85528 Personal history of other malignant neoplasm of kidney: Secondary | ICD-10-CM

## 2023-09-21 DIAGNOSIS — C649 Malignant neoplasm of unspecified kidney, except renal pelvis: Secondary | ICD-10-CM

## 2023-09-21 DIAGNOSIS — Z87891 Personal history of nicotine dependence: Secondary | ICD-10-CM | POA: Diagnosis not present

## 2023-09-21 DIAGNOSIS — N183 Chronic kidney disease, stage 3 unspecified: Secondary | ICD-10-CM

## 2023-09-21 DIAGNOSIS — E1122 Type 2 diabetes mellitus with diabetic chronic kidney disease: Secondary | ICD-10-CM

## 2023-09-21 DIAGNOSIS — I82451 Acute embolism and thrombosis of right peroneal vein: Secondary | ICD-10-CM | POA: Diagnosis not present

## 2023-09-21 DIAGNOSIS — I824Y1 Acute embolism and thrombosis of unspecified deep veins of right proximal lower extremity: Secondary | ICD-10-CM

## 2023-09-21 DIAGNOSIS — I82441 Acute embolism and thrombosis of right tibial vein: Secondary | ICD-10-CM | POA: Diagnosis not present

## 2023-09-21 DIAGNOSIS — I129 Hypertensive chronic kidney disease with stage 1 through stage 4 chronic kidney disease, or unspecified chronic kidney disease: Secondary | ICD-10-CM

## 2023-09-21 LAB — CMP (CANCER CENTER ONLY)
ALT: 61 U/L — ABNORMAL HIGH (ref 0–44)
AST: 61 U/L — ABNORMAL HIGH (ref 15–41)
Albumin: 3.6 g/dL (ref 3.5–5.0)
Alkaline Phosphatase: 86 U/L (ref 38–126)
Anion gap: 10 (ref 5–15)
BUN: 39 mg/dL — ABNORMAL HIGH (ref 8–23)
CO2: 25 mmol/L (ref 22–32)
Calcium: 9.9 mg/dL (ref 8.9–10.3)
Chloride: 105 mmol/L (ref 98–111)
Creatinine: 2.73 mg/dL — ABNORMAL HIGH (ref 0.44–1.00)
GFR, Estimated: 17 mL/min — ABNORMAL LOW (ref >60)
Glucose, Bld: 87 mg/dL (ref 70–99)
Potassium: 4.1 mmol/L (ref 3.5–5.1)
Sodium: 140 mmol/L (ref 135–145)
Total Bilirubin: 0.7 mg/dL (ref 0.3–1.2)
Total Protein: 7.8 g/dL (ref 6.5–8.1)

## 2023-09-21 LAB — CBC WITH DIFFERENTIAL (CANCER CENTER ONLY)
Abs Immature Granulocytes: 0.01 10*3/uL (ref 0.00–0.07)
Basophils Absolute: 0 10*3/uL (ref 0.0–0.1)
Basophils Relative: 1 %
Eosinophils Absolute: 0.1 10*3/uL (ref 0.0–0.5)
Eosinophils Relative: 1 %
HCT: 36.6 % (ref 36.0–46.0)
Hemoglobin: 11.1 g/dL — ABNORMAL LOW (ref 12.0–15.0)
Immature Granulocytes: 0 %
Lymphocytes Relative: 28 %
Lymphs Abs: 1.6 10*3/uL (ref 0.7–4.0)
MCH: 22.8 pg — ABNORMAL LOW (ref 26.0–34.0)
MCHC: 30.3 g/dL (ref 30.0–36.0)
MCV: 75.2 fL — ABNORMAL LOW (ref 80.0–100.0)
Monocytes Absolute: 0.4 10*3/uL (ref 0.1–1.0)
Monocytes Relative: 8 %
Neutro Abs: 3.6 10*3/uL (ref 1.7–7.7)
Neutrophils Relative %: 62 %
Platelet Count: 292 10*3/uL (ref 150–400)
RBC: 4.87 MIL/uL (ref 3.87–5.11)
RDW: 15.4 % (ref 11.5–15.5)
WBC Count: 5.7 10*3/uL (ref 4.0–10.5)
nRBC: 0 % (ref 0.0–0.2)

## 2023-09-21 LAB — LACTATE DEHYDROGENASE: LDH: 198 U/L — ABNORMAL HIGH (ref 98–192)

## 2023-09-21 NOTE — Progress Notes (Signed)
HEMATOLOGY/ONCOLOGY CONSULTATION NOTE  Date of Service: 09/21/2023  Patient Care Team: [name], MD as PCP - General (Family Medicine)  CHIEF COMPLAINTS/PURPOSE OF CONSULTATION:  evaluation and management of acute deep vein thrombosis of right lower extremity.   HISTORY OF PRESENTING ILLNESS:   Dana Fleming is a wonderful 82 y.o. female who is here for evaluation and management of acute deep vein thrombosis of right lower extremity.   She presented to the ED on 07/22/2023 and US showed findings consistent with acute deep vein thrombosis involving the right posterior tibial veins, and right peroneal veins of right leg.   Today, she reports that she has no other history of blood clots in the past. Patient has no fhx of blood clots or blood clotting diisorder. She reports a hx of DM and HTN.   She has a history of gout for 4 years, and is not on any medication for prevention. Patient has been previously recommended to monitor her diet to manage flares. Patient reports having a recent gout flare in her right foot. Her symptoms made it difficult to walk due to discomfort. Her leg swelling arose subsequently. She reports that her leg swelling began 2-3 weeks ago and fluctuates during the day. Patient did not endorse swelling in the thighs. Swelling was only present in the calf. She has no pain in her right leg at this time.  She reports that she presented to the ED on 07/22/23 due to sudden SOB. She denies having any cough, fever, or chills at that time. Ventilation and perfusion scan showed a right upper lobe lung clot and chest x-ray showed pneumonia in left upper lobe.  She has been on blood thinners for almost 2 months now.  Her SOB did improve with blood thinners. She has no respiratory symptoms including SOB or chest pain at this time.  She generally drinks milk and Pepsi regularly, but does not stay well hydrated with water intake. She sometimes puts lemon in her water. Patient notes  drinking 1 cup of coffee daily. She notes that she was taking cranberry juice for some time.   Patient denies any sudden change in bowel/urination habits. She reports fairly normal eating habits and denies any sudden weight loss. Patient denies any abdominal pain.  She report that when she was diagnosed with DM several years ago, she was on baby aspirin previously and developed Petechiae, and she subsequently stopped it.  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 Patient reports that she has not been able to exercise recently since her kidney removal. She reports that she has an upcoming CT scan with her urologist.   MEDICAL HISTORY:  Past Medical History:  Diagnosis Date   CKD (chronic kidney disease), stage III (HCC)    Diabetes mellitus without complication (HCC)    Type 2   Family history of adverse reaction to anesthesia    cousin didn't know he had sleep apnea and was on a ventilator for a month after   Hypertension    Sleep apnea    grandson has sleep apnea pt does not    SURGICAL HISTORY: Past Surgical History:  Procedure Laterality Date   CLOSED REDUCTION SHOULDER DISLOCATION     had conscious sedation   EYE SURGERY Bilateral    cataract surgery with lens implant   MULTIPLE TOOTH EXTRACTIONS     ROBOT ASSISTED LAPAROSCOPIC NEPHRECTOMY Right 03/29/2023   Procedure: XI ROBOTIC ASSISTED LAPAROSCOPIC NEPHRECTOMY;  Surgeon:  Loletta Parish., MD;  Location: [facility];  Service: Urology;  Laterality: Right;  3 HRS   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   Procedure: REVERSE TOTAL SHOULDER ARTHROPLASTY;  Surgeon: Cammy Copa, MD;  Location: [facility];  Service: Orthopedics;  Laterality: Right;  REVERSE TOTAL SHOULDER ARTHROPLASTY    SOCIAL HISTORY: Social History   Socioeconomic History   Marital status: Single    Spouse name: Not on file   Number of children: Not on file   Years of education: Not on file   Highest  education level: Not on file  Occupational History   Not on file  Tobacco Use   Smoking status: Former    Current packs/day: 0.00    Types: Cigarettes    Quit date: 01/13/1991    Years since quitting: 32.7   Smokeless tobacco: Never  Vaping Use   Vaping status: Never Used  Substance and Sexual Activity   Alcohol use: Yes    Comment: occasional   Drug use: No   Sexual activity: Not on file  Other Topics Concern   Not on file  Social History Narrative   Not on file   Social Determinants of Health   Financial Resource Strain: Not on file  Food Insecurity: No Food Insecurity (07/22/2023)   Hunger Vital Sign    Worried About Running Out of Food in the Last Year: Never true    Ran Out of Food in the Last Year: Never true  Transportation Needs: No Transportation Needs (07/22/2023)   PRAPARE - Administrator, Civil Service (Medical): No    Lack of Transportation (Non-Medical): No  Physical Activity: Not on file  Stress: Not on file  Social Connections: Not on file  Intimate Partner Violence: Not At Risk (07/22/2023)   Humiliation, Afraid, Rape, and Kick questionnaire    Fear of Current or Ex-Partner: No    Emotionally Abused: No    Physically Abused: No    Sexually Abused: No    FAMILY HISTORY: Family History  Problem Relation Age of Onset   Stroke Mother    Diabetes Mother    Diabetes Father    Heart attack Father     ALLERGIES:  is allergic to simvastatin.  MEDICATIONS:  Current Outpatient Medications  Medication Sig Dispense Refill   apixaban (ELIQUIS) 5 MG TABS tablet Take 2 tablets (10 mg total) by mouth 2 (two) times daily for 2 days, THEN 1 tablet (5 mg total) 2 (two) times daily for 28 days. 64 tablet 0   apixaban (ELIQUIS) 5 MG TABS tablet Take 1 tablet (5 mg total) by mouth 2 (two) times daily. Start this prescription when the eliquis starter pack has been completed. 60 tablet 3   atorvastatin (LIPITOR) 20 MG tablet Take 20 mg by mouth at bedtime.   0   cholecalciferol (VITAMIN D3) 25 MCG (1000 UNIT) tablet Take 2,000 Units by mouth daily.     dapagliflozin propanediol (FARXIGA) 5 MG TABS tablet Take 5 mg by mouth daily.     labetalol (NORMODYNE) 100 MG tablet Take 100 mg by mouth 2 (two) times daily.     OVER THE COUNTER MEDICATION Takes tart cherry and ginger over the counter     sodium bicarbonate 650 MG tablet Take 2 tablets (1,300 mg total) by mouth 2 (two) times daily. 120 tablet 3   sodium chloride (OCEAN) 0.65 % SOLN nasal spray Place 1 spray into both nostrils as needed for congestion (nose irritation).  44 mL 3   No current facility-administered medications for this visit.    REVIEW OF SYSTEMS:    10 Point review of Systems was done is negative except as noted above.  PHYSICAL EXAMINATION: ECOG PERFORMANCE STATUS: 1 - Symptomatic but completely ambulatory  VSS  GENERAL:alert, in no acute distress and comfortable SKIN: no acute rashes, no significant lesions EYES: conjunctiva are pink and non-injected, sclera anicteric OROPHARYNX: MMM, no exudates, no oropharyngeal erythema or ulceration NECK: supple, no JVD LYMPH:  no palpable lymphadenopathy in the cervical, axillary or inguinal regions LUNGS: clear to auscultation b/l with normal respiratory effort HEART: regular rate & rhythm ABDOMEN:  normoactive bowel sounds , non tender, not distended. Extremity: no pedal edema PSYCH: alert & oriented x 3 with fluent speech NEURO: no focal motor/sensory deficits  LABORATORY DATA:  I have reviewed the data as listed  .    Latest Ref Rng & Units 09/21/2023   12:45 PM 07/30/2023    4:21 AM 07/29/2023    4:16 AM  CBC  WBC 4.0 - 10.5 K/uL 5.7  5.6  6.4   Hemoglobin 12.0 - 15.0 g/dL 57.8  7.9  8.0   Hematocrit 36.0 - 46.0 % 36.6  25.5  26.8   Platelets 150 - 400 K/uL 292  224  225     .    Latest Ref Rng & Units 09/21/2023   12:45 PM 07/30/2023    4:21 AM 07/29/2023    4:16 AM  CMP  Glucose 70 - 99 mg/dL 87  91  96    BUN 8 - 23 mg/dL 39  27  28   Creatinine 0.44 - 1.00 mg/dL 4.69  6.29  5.28   Sodium 135 - 145 mmol/L 140  137  139   Potassium 3.5 - 5.1 mmol/L 4.1  4.8  4.5   Chloride 98 - 111 mmol/L 105  107  105   CO2 22 - 32 mmol/L 25  22  21    Calcium 8.9 - 10.3 mg/dL 9.9  9.1  9.2   Total Protein 6.5 - 8.1 g/dL 7.8     Total Bilirubin 0.3 - 1.2 mg/dL 0.7     Alkaline Phos 38 - 126 U/L 86     AST 15 - 41 U/L 61     ALT 0 - 44 U/L 61        RADIOGRAPHIC STUDIES: I have personally reviewed the radiological images as listed and agreed with the findings in the report. No results found.  ASSESSMENT & PLAN:  82 y.o.  female with  acute deep vein thrombosis of right lower extremity  PLAN:  -potential triggers of her blood clot might include her dehydration status, recent gout flare, and pneumonia -will order basic workup to evaluate for any other factors including abnormal antibodies or inherited clotting disorders. There is low suspeician for inherited clotting disorder due to factors such as no blood clots  with previous surgeries and pregnancies, and no storng fhx of blood clots.  -will order a few blood tests today including checking Factor V Leiden and prothrombin gene mutation to rule out any obvious findings -continue blood thinners for at least 6 months and potentially longer depending on CT scan findings and ensuring that there are no signs of cancer. Would then potentially plan to switch  to baby aspirin for protection to reduce risk of recurrent clots -discussed that generally, with increased age of 19-70, aspirin may increase bruising. There may be a role  to adjust aspirin if symptoms are bothersome -her leg swelling has been stable since being on blood thinners -discussed that leg swelling generally should improve within 4-8 weeks from a blood clot standpoint -educated patient that sodium bicarbonate can cause fluid retention -recommend patient to connect with Dr. Ronalee Belts to consider  adjusting sodium bicarbonate. -discussed that there may be post thrombotic syndrome where there might be a chronic element of a blood clot. Recommend using compression socks to reduce leg swelling -if she has recurrent gout flares despite optimizing hydration and food intake, there may be a role for a preventative medication such as Allopurinol especially given her history of CKD, which could trigger more frequent gout flares -would recommend 1000-1500 MG of calcium a day from diet -she appeared to be dehydrated based on labs -recommend patient to drink 48-64 ounces of water a day, and limiting use of lemons with water -educated patient that cranberry juice can reduce the chance of developing a UTI, but would not be a main source of hydration -Patient reportedly has an upcoming CT scan on September 29, 2023 with urologist, Dr. Berneice Heinrich. She is unsure of the type of CT scan. Recommend patient to inform Dr. Berneice Heinrich of her findings.  -I would recommend a CT chest/abdm/pelvis scan without IV dye in the next couple months to ensure that her pneumonia clears and to evaluate kidney and ensure there are no other concerning results.  -Patient shall either receive CT chest/abdm/pelvis scan with [place] or urology -continue to follow with Dr. Ronalee Belts for optimal kidney care -advised patient to stay UTD with age-apprproate cancer screenings  FOLLOW-UP: Labs today Phone visit with Dr Candise Che in 2 weeks  The total time spent in the appointment was 60 minutes* .  All of the patient's questions were answered with apparent satisfaction. The patient knows to call the clinic with any problems, questions or concerns.   Wyvonnia Lora MD MS AAHIVMS [facility] Hematology/Oncology Physician [facility]  .*Total Encounter Time as defined by the Centers for Medicare and Medicaid Services includes, in addition to the face-to-face time of a patient visit (documented in the note above) non-face-to-face time: obtaining and  reviewing outside history, ordering and reviewing medications, tests or procedures, care coordination (communications with other health care professionals or caregivers) and documentation in the medical record.    I,Mitra Faeizi,acting as a Neurosurgeon for Wyvonnia Lora, MD.,have documented all relevant documentation on the behalf of Wyvonnia Lora, MD,as directed by  Wyvonnia Lora, MD while in the presence of Wyvonnia Lora, MD.  .I have reviewed the above documentation for accuracy and completeness, and I agree with the above. Johney Maine MD

## 2023-09-22 LAB — LUPUS ANTICOAGULANT PANEL
DRVVT: 93.1 s — ABNORMAL HIGH (ref 0.0–47.0)
PTT Lupus Anticoagulant: 40.6 s (ref 0.0–43.5)

## 2023-09-22 LAB — CARDIOLIPIN ANTIBODIES, IGG, IGM, IGA
Anticardiolipin IgA: <9 U/mL (ref 0–11)
Anticardiolipin IgG: <9 [GPL'U]/mL (ref 0–14)
Anticardiolipin IgM: <9 [MPL'U]/mL (ref 0–12)

## 2023-09-22 LAB — DRVVT CONFIRM: dRVVT Confirm: 0.9 {ratio} (ref 0.8–1.2)

## 2023-09-22 LAB — DRVVT MIX: dRVVT Mix: 58.4 s — ABNORMAL HIGH (ref 0.0–40.4)

## 2023-09-22 LAB — BETA-2-GLYCOPROTEIN I ABS, IGG/M/A
Beta-2 Glyco I IgG: <9 GPI IgG units (ref 0–20)
Beta-2-Glycoprotein I IgA: <9 GPI IgA units (ref 0–25)
Beta-2-Glycoprotein I IgM: <9 GPI IgM units (ref 0–32)

## 2023-09-26 LAB — PROTHROMBIN GENE MUTATION

## 2023-09-28 LAB — FACTOR 5 LEIDEN

## 2023-09-29 DIAGNOSIS — C641 Malignant neoplasm of right kidney, except renal pelvis: Secondary | ICD-10-CM | POA: Diagnosis not present

## 2023-09-29 DIAGNOSIS — Z905 Acquired absence of kidney: Secondary | ICD-10-CM | POA: Diagnosis not present

## 2023-10-05 ENCOUNTER — Other Ambulatory Visit: Payer: Self-pay

## 2023-10-05 DIAGNOSIS — I824Y1 Acute embolism and thrombosis of unspecified deep veins of right proximal lower extremity: Secondary | ICD-10-CM

## 2023-10-05 MED ORDER — APIXABAN 2.5 MG PO TABS: 2.5000 mg | ORAL_TABLET | Freq: Two times a day (BID) | ORAL | 2 refills | Status: DC

## 2023-10-05 MED ORDER — APIXABAN 2.5 MG PO TABS: 2.5000 mg | ORAL_TABLET | Freq: Two times a day (BID) | ORAL | 0 refills | Status: DC

## 2023-10-05 MED ORDER — APIXABAN 5 MG PO TABS: 5.0000 mg | ORAL_TABLET | Freq: Two times a day (BID) | ORAL | 3 refills | Status: DC

## 2023-10-09 DIAGNOSIS — N184 Chronic kidney disease, stage 4 (severe): Secondary | ICD-10-CM | POA: Diagnosis not present

## 2023-10-11 ENCOUNTER — Inpatient Hospital Stay: Payer: Medicare Other | Attending: Hematology and Oncology | Admitting: Hematology

## 2023-10-11 DIAGNOSIS — Z85528 Personal history of other malignant neoplasm of kidney: Secondary | ICD-10-CM | POA: Diagnosis not present

## 2023-10-11 DIAGNOSIS — I824Y1 Acute embolism and thrombosis of unspecified deep veins of right proximal lower extremity: Secondary | ICD-10-CM

## 2023-10-11 DIAGNOSIS — Z7901 Long term (current) use of anticoagulants: Secondary | ICD-10-CM | POA: Insufficient documentation

## 2023-10-11 DIAGNOSIS — N183 Chronic kidney disease, stage 3 unspecified: Secondary | ICD-10-CM | POA: Insufficient documentation

## 2023-10-11 DIAGNOSIS — I82441 Acute embolism and thrombosis of right tibial vein: Secondary | ICD-10-CM | POA: Insufficient documentation

## 2023-10-11 DIAGNOSIS — Z87891 Personal history of nicotine dependence: Secondary | ICD-10-CM | POA: Insufficient documentation

## 2023-10-11 MED ORDER — POLYSACCHARIDE IRON COMPLEX 150 MG PO CAPS: 150.0000 mg | ORAL_CAPSULE | Freq: Every day | ORAL | 1 refills | Status: DC

## 2023-10-11 NOTE — Progress Notes (Signed)
HEMATOLOGY/ONCOLOGY PHONE VISIT NOTE  Date of Service: 10/11/2023  Patient Care Team: [name], MD as PCP - General (Family Medicine)  CHIEF COMPLAINTS/PURPOSE OF CONSULTATION:  evaluation and management of acute deep vein thrombosis of right lower extremity.   HISTORY OF PRESENTING ILLNESS:   Dana Fleming is a wonderful 82 y.o. female who is here for evaluation and management of acute deep vein thrombosis of right lower extremity.   She presented to the ED on 07/22/2023 and US showed findings consistent with acute deep vein thrombosis involving the right posterior tibial veins, and right peroneal veins of right leg.   Today, she reports that she has no other history of blood clots in the past. Patient has no fhx of blood clots or blood clotting diisorder. She reports a hx of DM and HTN.   She has a history of gout for 4 years, and is not on any medication for prevention. Patient has been previously recommended to monitor her diet to manage flares. Patient reports having a recent gout flare in her right foot. Her symptoms made it difficult to walk due to discomfort. Her leg swelling arose subsequently. She reports that her leg swelling began 2-3 weeks ago and fluctuates during the day. Patient did not endorse swelling in the thighs. Swelling was only present in the calf. She has no pain in her right leg at this time.  She reports that she presented to the ED on 07/22/23 due to sudden SOB. She denies having any cough, fever, or chills at that time. Ventilation and perfusion scan showed a right upper lobe lung clot and chest x-ray showed pneumonia in left upper lobe.  She has been on blood thinners for almost 2 months now.  Her SOB did improve with blood thinners. She has no respiratory symptoms including SOB or chest pain at this time.  She generally drinks milk and Pepsi regularly, but does not stay well hydrated with water intake. She sometimes puts lemon in her water. Patient notes  drinking 1 cup of coffee daily. She notes that she was taking cranberry juice for some time.   Patient denies any sudden change in bowel/urination habits. She reports fairly normal eating habits and denies any sudden weight loss. Patient denies any abdominal pain.  She report that when she was diagnosed with DM several years ago, she was on baby aspirin previously and developed Petechiae, and she subsequently stopped it.  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 Patient reports that she has not been able to exercise recently since her kidney removal. She reports that she has an upcoming CT scan with her urologist.   INTERVAL HISTORY:  Dana Fleming is a 82 y.o. female here for continued evaluation and management of acute deep vein thrombosis. She was initially seen by me on 09/21/2023 and reported a gout flare in her right foot with swelling and discomfort.   I connected with Sharlyn Bologna on 10/11/23 at 12:00 PM EST by telephone visit and verified that I am speaking with the correct person using two identifiers.   I discussed the limitations, risks, security and privacy concerns of performing an evaluation and management service by telemedicine and the availability of in-person appointments. I also discussed with the patient that there may be a patient responsible charge related to this service. The patient expressed understanding and agreed to proceed.   Other persons participating in the visit and their role in the encounter: her niece  Patient's location: home  Provider's location: [facility]   Chief Complaint: continued evaluation and management of acute deep vein thrombosis    Today, she reports that she has been doing well overall since her last visit. Patient reports that she has been staying hydrated.   Her leg swelling has improved. She continues to take Sodium bicarbonate tablets at this time.   She reports that she does not take any iron pills.    She reports that her eliquis was previously cut down to 5 MG from 10 MG twice a day. Patient currently takes 2.5 MG Eliquis.   She did have a CT scan of the abdomen with Dr. Berneice Heinrich. The resuts have not yet discussed with her. Patient denies any plans for CT scan of the chest.   She does have follow up with Dr. Ronalee Belts for management of her kidneys.   The results of her recent lab workup was discussed with her in detail.   MEDICAL HISTORY:  Past Medical History:  Diagnosis Date   CKD (chronic kidney disease), stage III (HCC)    Diabetes mellitus without complication (HCC)    Type 2   Family history of adverse reaction to anesthesia    cousin didn't know he had sleep apnea and was on a ventilator for a month after   Hypertension    Sleep apnea    grandson has sleep apnea pt does not    SURGICAL HISTORY: Past Surgical History:  Procedure Laterality Date   CLOSED REDUCTION SHOULDER DISLOCATION     had conscious sedation   EYE SURGERY Bilateral    cataract surgery with lens implant   MULTIPLE TOOTH EXTRACTIONS     ROBOT ASSISTED LAPAROSCOPIC NEPHRECTOMY Right 03/29/2023   Procedure: XI ROBOTIC ASSISTED LAPAROSCOPIC NEPHRECTOMY;  Surgeon: Loletta Parish., MD;  Location: [facility];  Service: Urology;  Laterality: Right;  3 HRS   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   Procedure: REVERSE TOTAL SHOULDER ARTHROPLASTY;  Surgeon: Cammy Copa, MD;  Location: [facility];  Service: Orthopedics;  Laterality: Right;  REVERSE TOTAL SHOULDER ARTHROPLASTY    SOCIAL HISTORY: Social History   Socioeconomic History   Marital status: Single    Spouse name: Not on file   Number of children: Not on file   Years of education: Not on file   Highest education level: Not on file  Occupational History   Not on file  Tobacco Use   Smoking status: Former    Current packs/day: 0.00    Types: Cigarettes    Quit date: 01/13/1991    Years since quitting: 32.7   Smokeless tobacco: Never   Vaping Use   Vaping status: Never Used  Substance and Sexual Activity   Alcohol use: Yes    Comment: occasional   Drug use: No   Sexual activity: Not on file  Other Topics Concern   Not on file  Social History Narrative   Not on file   Social Determinants of Health   Financial Resource Strain: Not on file  Food Insecurity: No Food Insecurity (07/22/2023)   Hunger Vital Sign    Worried About Running Out of Food in the Last Year: Never true    Ran Out of Food in the Last Year: Never true  Transportation Needs: No Transportation Needs (07/22/2023)   PRAPARE - Administrator, Civil Service (Medical): No    Lack of Transportation (Non-Medical): No  Physical Activity: Not on file  Stress: Not on file  Social Connections: Not on file  Intimate Partner Violence: Not At Risk (07/22/2023)   Humiliation, Afraid, Rape, and Kick questionnaire    Fear of Current or Ex-Partner: No    Emotionally Abused: No    Physically Abused: No    Sexually Abused: No    FAMILY HISTORY: Family History  Problem Relation Age of Onset   Stroke Mother    Diabetes Mother    Diabetes Father    Heart attack Father     ALLERGIES:  is allergic to simvastatin.  MEDICATIONS:  Current Outpatient Medications  Medication Sig Dispense Refill   apixaban (ELIQUIS) 2.5 MG TABS tablet Take 1 tablet (2.5 mg total) by mouth 2 (two) times daily. 60 tablet 0   atorvastatin (LIPITOR) 20 MG tablet Take 20 mg by mouth at bedtime.  0   cholecalciferol (VITAMIN D3) 25 MCG (1000 UNIT) tablet Take 2,000 Units by mouth daily.     dapagliflozin propanediol (FARXIGA) 5 MG TABS tablet Take 5 mg by mouth daily.     labetalol (NORMODYNE) 100 MG tablet Take 100 mg by mouth 2 (two) times daily.     OVER THE COUNTER MEDICATION Takes tart cherry and ginger over the counter     sodium bicarbonate 650 MG tablet Take 2 tablets (1,300 mg total) by mouth 2 (two) times daily. 120 tablet 3   sodium chloride (OCEAN) 0.65 % SOLN  nasal spray Place 1 spray into both nostrils as needed for congestion (nose irritation). 44 mL 3   No current facility-administered medications for this visit.    REVIEW OF SYSTEMS:    10 Point review of Systems was done is negative except as noted above.   PHYSICAL EXAMINATION: TELEMEDICINE VISIT ECOG PERFORMANCE STATUS: 1 - Symptomatic but completely ambulatory  LABORATORY DATA:  I have reviewed the data as listed  .    Latest Ref Rng & Units 09/21/2023   12:45 PM 07/30/2023    4:21 AM 07/29/2023    4:16 AM  CBC  WBC 4.0 - 10.5 K/uL 5.7  5.6  6.4   Hemoglobin 12.0 - 15.0 g/dL 08.6  7.9  8.0   Hematocrit 36.0 - 46.0 % 36.6  25.5  26.8   Platelets 150 - 400 K/uL 292  224  225     .    Latest Ref Rng & Units 09/21/2023   12:45 PM 07/30/2023    4:21 AM 07/29/2023    4:16 AM  CMP  Glucose 70 - 99 mg/dL 87  91  96   BUN 8 - 23 mg/dL 39  27  28   Creatinine 0.44 - 1.00 mg/dL 5.78  4.69  6.29   Sodium 135 - 145 mmol/L 140  137  139   Potassium 3.5 - 5.1 mmol/L 4.1  4.8  4.5   Chloride 98 - 111 mmol/L 105  107  105   CO2 22 - 32 mmol/L 25  22  21    Calcium 8.9 - 10.3 mg/dL 9.9  9.1  9.2   Total Protein 6.5 - 8.1 g/dL 7.8     Total Bilirubin 0.3 - 1.2 mg/dL 0.7     Alkaline Phos 38 - 126 U/L 86     AST 15 - 41 U/L 61     ALT 0 - 44 U/L 61        RADIOGRAPHIC STUDIES: I have personally reviewed the radiological images as listed and agreed with the findings in the report. No results found.  ASSESSMENT &  PLAN:  82 y.o.  female with  Acute deep vein thrombosis of right lower extremity  PLAN:  -Discussed lab results from 09/21/2023 in detail with patient. CBC showed WBC of 5.7K, hemoglobin of 11.1, and platelets of 292K. -her hgb was previously 7.9 two months ago, and has improved to 11.1 currently -discussed that part of her anemia may be from iron deficiency and chronic kidney disease. Her anemia is improving at this time.  -WBC and platelets normal -prothrombin gene  and factor 5 leiden mutations negative -cardiolipin antibodies negative -patient continues to have significant CKD. Discussed that if her kidney numbers worsen, there may be concern that eliquis could accumulate in the kidneys. She shall continue to follow with her PCP and nephrologist for continued monitoring and management.  -continue eliquis 2.5 MG twice a day at this time -there is slight elevation of a couple liver enzymes. I suspect that her elevated liver enzymes may be due to fatty liver or cholesterol-lowering medication. Discussed that this needs to be followed up on with her PCP.  -No obvious explanation for blood clots. The chance of an inheritied clotting disorder is ruled out.  -Given her history of kidney cancer, I would recommend repeating CT chest/abdm/pelvis scan to follow-up on her kidney cancer and liver status. I do not suspect any concerning findings in regards to her liver on imaging. CT scan would also show whether there are any changes in the lung. Recommend CT chest without IV dye within the next month to ensure there are no other concerning findings.  -patient shall confirm with her urologist, Dr. Berneice Heinrich, what type of CT scan has been ordered. -discussed that factors such as dehydration, gout, and pneumonia may have been potential triggers for her blood clot -if there are no concering findings on both scans, I would recommend to continue blood thinners for 6-9 months at current dose then switching to baby aspirin if there are no other concerns at that time -discussed that sodium bicarbonate could add to her leg swelling. Recommend patient to connect with the prescribing physician as soon as possible to discuss whether there may be a role to discontinue it.  -recommend patient to take 150 MG iron polysaccharide once a day to optimize blood counts. Also discussed option of ferrous sulfate.  -answered all of patient's and her niece's questions in detail -continue to follow with  Dr. Ronalee Belts for optimal kidney care  FOLLOW-UP: Ct chest wo contrast in 4 weeks Labs in 4 weeks Phone visit with Dr Candise Che in 5 weeks  The total time spent in the appointment was 20 minutes* .  All of the patient's questions were answered with apparent satisfaction. The patient knows to call the clinic with any problems, questions or concerns.   Wyvonnia Lora MD MS AAHIVMS [facility] Hematology/Oncology Physician [facility]  .*Total Encounter Time as defined by the Centers for Medicare and Medicaid Services includes, in addition to the face-to-face time of a patient visit (documented in the note above) non-face-to-face time: obtaining and reviewing outside history, ordering and reviewing medications, tests or procedures, care coordination (communications with other health care professionals or caregivers) and documentation in the medical record.    I,Mitra Faeizi,acting as a Neurosurgeon for Wyvonnia Lora, MD.,have documented all relevant documentation on the behalf of Wyvonnia Lora, MD,as directed by  Wyvonnia Lora, MD while in the presence of Wyvonnia Lora, MD.  .I have reviewed the above documentation for accuracy and completeness, and I agree with the above. Rance Muir  Kermit Balo MD

## 2023-10-16 DIAGNOSIS — I129 Hypertensive chronic kidney disease with stage 1 through stage 4 chronic kidney disease, or unspecified chronic kidney disease: Secondary | ICD-10-CM | POA: Diagnosis not present

## 2023-10-16 DIAGNOSIS — E875 Hyperkalemia: Secondary | ICD-10-CM | POA: Diagnosis not present

## 2023-10-16 DIAGNOSIS — N184 Chronic kidney disease, stage 4 (severe): Secondary | ICD-10-CM | POA: Diagnosis not present

## 2023-10-16 DIAGNOSIS — E1122 Type 2 diabetes mellitus with diabetic chronic kidney disease: Secondary | ICD-10-CM | POA: Diagnosis not present

## 2023-10-16 DIAGNOSIS — N2889 Other specified disorders of kidney and ureter: Secondary | ICD-10-CM | POA: Diagnosis not present

## 2023-10-16 DIAGNOSIS — I82409 Acute embolism and thrombosis of unspecified deep veins of unspecified lower extremity: Secondary | ICD-10-CM | POA: Diagnosis not present

## 2023-10-16 DIAGNOSIS — D631 Anemia in chronic kidney disease: Secondary | ICD-10-CM | POA: Diagnosis not present

## 2023-10-19 DIAGNOSIS — C641 Malignant neoplasm of right kidney, except renal pelvis: Secondary | ICD-10-CM | POA: Diagnosis not present

## 2023-10-24 ENCOUNTER — Other Ambulatory Visit: Payer: Self-pay

## 2023-11-02 ENCOUNTER — Other Ambulatory Visit: Payer: Self-pay

## 2023-11-02 DIAGNOSIS — Z85528 Personal history of other malignant neoplasm of kidney: Secondary | ICD-10-CM

## 2023-11-02 DIAGNOSIS — I824Y1 Acute embolism and thrombosis of unspecified deep veins of right proximal lower extremity: Secondary | ICD-10-CM

## 2023-11-02 MED ORDER — APIXABAN 2.5 MG PO TABS: 2.5000 mg | ORAL_TABLET | Freq: Two times a day (BID) | ORAL | 0 refills | Status: DC

## 2023-11-03 ENCOUNTER — Other Ambulatory Visit: Payer: Self-pay

## 2023-11-03 DIAGNOSIS — I824Y1 Acute embolism and thrombosis of unspecified deep veins of right proximal lower extremity: Secondary | ICD-10-CM

## 2023-11-06 ENCOUNTER — Inpatient Hospital Stay: Payer: Medicare Other | Attending: Hematology and Oncology

## 2023-11-06 DIAGNOSIS — R944 Abnormal results of kidney function studies: Secondary | ICD-10-CM | POA: Diagnosis not present

## 2023-11-06 DIAGNOSIS — Z85528 Personal history of other malignant neoplasm of kidney: Secondary | ICD-10-CM | POA: Diagnosis not present

## 2023-11-06 DIAGNOSIS — I824Y1 Acute embolism and thrombosis of unspecified deep veins of right proximal lower extremity: Secondary | ICD-10-CM

## 2023-11-06 DIAGNOSIS — Z905 Acquired absence of kidney: Secondary | ICD-10-CM | POA: Insufficient documentation

## 2023-11-06 DIAGNOSIS — R7989 Other specified abnormal findings of blood chemistry: Secondary | ICD-10-CM | POA: Diagnosis not present

## 2023-11-06 DIAGNOSIS — I82441 Acute embolism and thrombosis of right tibial vein: Secondary | ICD-10-CM | POA: Diagnosis not present

## 2023-11-06 DIAGNOSIS — Z87891 Personal history of nicotine dependence: Secondary | ICD-10-CM | POA: Diagnosis not present

## 2023-11-06 DIAGNOSIS — D649 Anemia, unspecified: Secondary | ICD-10-CM | POA: Insufficient documentation

## 2023-11-06 DIAGNOSIS — K449 Diaphragmatic hernia without obstruction or gangrene: Secondary | ICD-10-CM | POA: Insufficient documentation

## 2023-11-06 DIAGNOSIS — M10071 Idiopathic gout, right ankle and foot: Secondary | ICD-10-CM | POA: Insufficient documentation

## 2023-11-06 DIAGNOSIS — I82451 Acute embolism and thrombosis of right peroneal vein: Secondary | ICD-10-CM | POA: Diagnosis not present

## 2023-11-06 LAB — CBC WITH DIFFERENTIAL (CANCER CENTER ONLY)
Abs Immature Granulocytes: 0.01 10*3/uL (ref 0.00–0.07)
Basophils Absolute: 0 10*3/uL (ref 0.0–0.1)
Basophils Relative: 1 %
Eosinophils Absolute: 0.1 10*3/uL (ref 0.0–0.5)
Eosinophils Relative: 1 %
HCT: 33.4 % — ABNORMAL LOW (ref 36.0–46.0)
Hemoglobin: 10.3 g/dL — ABNORMAL LOW (ref 12.0–15.0)
Immature Granulocytes: 0 %
Lymphocytes Relative: 30 %
Lymphs Abs: 1.5 10*3/uL (ref 0.7–4.0)
MCH: 23.3 pg — ABNORMAL LOW (ref 26.0–34.0)
MCHC: 30.8 g/dL (ref 30.0–36.0)
MCV: 75.4 fL — ABNORMAL LOW (ref 80.0–100.0)
Monocytes Absolute: 0.5 10*3/uL (ref 0.1–1.0)
Monocytes Relative: 11 %
Neutro Abs: 2.7 10*3/uL (ref 1.7–7.7)
Neutrophils Relative %: 57 %
Platelet Count: 207 10*3/uL (ref 150–400)
RBC: 4.43 MIL/uL (ref 3.87–5.11)
RDW: 15.8 % — ABNORMAL HIGH (ref 11.5–15.5)
WBC Count: 4.8 10*3/uL (ref 4.0–10.5)
nRBC: 0 % (ref 0.0–0.2)

## 2023-11-06 LAB — CMP (CANCER CENTER ONLY)
ALT: 36 U/L (ref 0–44)
AST: 38 U/L (ref 15–41)
Albumin: 3.7 g/dL (ref 3.5–5.0)
Alkaline Phosphatase: 89 U/L (ref 38–126)
Anion gap: 7 (ref 5–15)
BUN: 34 mg/dL — ABNORMAL HIGH (ref 8–23)
CO2: 25 mmol/L (ref 22–32)
Calcium: 9.6 mg/dL (ref 8.9–10.3)
Chloride: 108 mmol/L (ref 98–111)
Creatinine: 2.61 mg/dL — ABNORMAL HIGH (ref 0.44–1.00)
GFR, Estimated: 18 mL/min — ABNORMAL LOW (ref >60)
Glucose, Bld: 85 mg/dL (ref 70–99)
Potassium: 4.2 mmol/L (ref 3.5–5.1)
Sodium: 140 mmol/L (ref 135–145)
Total Bilirubin: 0.8 mg/dL (ref <1.2)
Total Protein: 6.9 g/dL (ref 6.5–8.1)

## 2023-11-15 ENCOUNTER — Encounter: Payer: Self-pay | Admitting: Physician Assistant

## 2023-11-15 ENCOUNTER — Ambulatory Visit (INDEPENDENT_AMBULATORY_CARE_PROVIDER_SITE_OTHER): Payer: Medicare Other | Admitting: Physician Assistant

## 2023-11-15 DIAGNOSIS — M1711 Unilateral primary osteoarthritis, right knee: Secondary | ICD-10-CM | POA: Diagnosis not present

## 2023-11-15 MED ORDER — LIDOCAINE HCL 1 % IJ SOLN
3.0000 mL | INTRAMUSCULAR | Status: AC | PRN
  Administered 2023-11-15: 3 mL

## 2023-11-15 MED ORDER — METHYLPREDNISOLONE ACETATE 40 MG/ML IJ SUSP
40.0000 mg | INTRAMUSCULAR | Status: AC | PRN
  Administered 2023-11-15: 40 mg via INTRA_ARTICULAR

## 2023-11-15 NOTE — Progress Notes (Signed)
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  She manages quite well with periodic injections.  Presents today requesting injection no new injury  Assessment & Plan: Visit Diagnoses: Osteoarthritis right knee  Plan: She does well with conservative treatment may follow-up as needed  Follow-Up Instructions: Return if symptoms worsen or fail to improve.   Ortho Exam  Patient is alert, oriented, no adenopathy, well-dressed, normal affect, normal respiratory effort. Right knee no effusion no erythema compartments are soft and compressible she is neurovascular intact  Imaging: No results found. No images are attached to the encounter.  Labs: Lab Results  Component Value Date   HGBA1C 6.1 (H) 03/16/2023   HGBA1C 7.4 (H) 01/19/2016   REPTSTATUS 01/15/2016 FINAL 01/14/2016   CULT MULTIPLE SPECIES PRESENT, SUGGEST RECOLLECTION 01/14/2016     Lab Results  Component Value Date   ALBUMIN 3.7 11/06/2023   ALBUMIN 3.6 09/21/2023   ALBUMIN 2.5 (L) 07/26/2023    Lab Results  Component Value Date   MG 2.1 07/26/2023   MG 1.6 (L) 07/25/2023   MG 1.7 07/24/2023   No results found for: "VD25OH"  No results found for: "PREALBUMIN"    Latest Ref Rng & Units 11/06/2023    9:38 AM 09/21/2023   12:45 PM 07/30/2023    4:21 AM  CBC EXTENDED  WBC 4.0 - 10.5 K/uL 4.8  5.7  5.6   RBC 3.87 - 5.11 MIL/uL 4.43  4.87  3.56   Hemoglobin 12.0 - 15.0 g/dL 72.5  36.6  7.9   HCT 44.0 - 46.0 % 33.4  36.6  25.5   Platelets 150 - 400 K/uL 207  292  224   NEUT# 1.7 - 7.7 K/uL 2.7  3.6    Lymph# 0.7 - 4.0 K/uL 1.5  1.6        There is no height or weight on file to calculate BMI.  Orders:  No orders of the defined types were placed in this encounter.  No orders of the defined types were placed in this encounter.    Procedures: Large Joint Inj on 11/15/2023 9:33 AM Indications: pain and diagnostic evaluation Details: 25 G 1.5 in needle, anteromedial approach  Arthrogram: No  Medications: 40 mg methylPREDNISolone acetate 40 MG/ML; 3 mL lidocaine 1 % Outcome: tolerated well, no immediate complications Procedure, treatment alternatives, risks and benefits explained, specific risks discussed. Consent was given by the patient.      Clinical Data: No additional findings.  ROS:  All other systems negative, except as noted in the HPI. Review of Systems  Objective: Vital Signs: There were no vitals taken for this visit.  Specialty Comments:  No specialty comments available.  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   Past Medical History:  Diagnosis Date   CKD (chronic kidney disease), stage III (HCC)    Diabetes mellitus without complication (HCC)    Type 2   Family history of adverse reaction to anesthesia    cousin didn't know he had sleep apnea and was on a ventilator for a month after   Hypertension    Sleep apnea    grandson has sleep apnea pt does not    Family History  Problem Relation Age of Onset   Stroke  Mother    Diabetes Mother    Diabetes Father    Heart attack Father     Past Surgical History:  Procedure Laterality Date   CLOSED REDUCTION SHOULDER DISLOCATION     had conscious sedation   EYE SURGERY Bilateral    cataract surgery with lens implant   MULTIPLE TOOTH EXTRACTIONS     ROBOT ASSISTED LAPAROSCOPIC NEPHRECTOMY Right 03/29/2023   Procedure: XI ROBOTIC ASSISTED LAPAROSCOPIC NEPHRECTOMY;  Surgeon: Loletta Parish., MD;  Location: [facility];  Service: Urology;  Laterality: Right;  3 HRS   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   Procedure: REVERSE TOTAL SHOULDER ARTHROPLASTY;  Surgeon: Cammy Copa, MD;  Location: [facility];  Service: Orthopedics;  Laterality: Right;  REVERSE TOTAL SHOULDER ARTHROPLASTY   Social History   Occupational History   Not on file  Tobacco Use   Smoking status: Former    Current packs/day: 0.00    Types: Cigarettes    Quit date: 01/13/1991    Years since quitting: 32.8   Smokeless tobacco: Never  Vaping Use   Vaping status: Never Used  Substance and Sexual Activity   Alcohol use: Yes    Comment: occasional   Drug use: No   Sexual activity: Not on file

## 2023-11-16 ENCOUNTER — Ambulatory Visit (HOSPITAL_COMMUNITY)
Admission: RE | Admit: 2023-11-16 | Discharge: 2023-11-16 | Disposition: A | Discharge status: Home / Self Care | Payer: Medicare Other | Source: Ambulatory Visit | Attending: Hematology | Admitting: Hematology

## 2023-11-16 DIAGNOSIS — I824Y1 Acute embolism and thrombosis of unspecified deep veins of right proximal lower extremity: Secondary | ICD-10-CM | POA: Diagnosis not present

## 2023-11-16 DIAGNOSIS — C649 Malignant neoplasm of unspecified kidney, except renal pelvis: Secondary | ICD-10-CM | POA: Diagnosis not present

## 2023-11-16 DIAGNOSIS — J984 Other disorders of lung: Secondary | ICD-10-CM | POA: Diagnosis not present

## 2023-11-16 DIAGNOSIS — Z85528 Personal history of other malignant neoplasm of kidney: Secondary | ICD-10-CM | POA: Diagnosis not present

## 2023-11-16 DIAGNOSIS — K449 Diaphragmatic hernia without obstruction or gangrene: Secondary | ICD-10-CM | POA: Diagnosis not present

## 2023-11-16 DIAGNOSIS — I700 Atherosclerosis of aorta: Secondary | ICD-10-CM | POA: Diagnosis not present

## 2023-11-20 ENCOUNTER — Inpatient Hospital Stay (HOSPITAL_BASED_OUTPATIENT_CLINIC_OR_DEPARTMENT_OTHER): Payer: Medicare Other | Admitting: Hematology

## 2023-11-20 DIAGNOSIS — I824Y1 Acute embolism and thrombosis of unspecified deep veins of right proximal lower extremity: Secondary | ICD-10-CM | POA: Diagnosis not present

## 2023-11-20 NOTE — Progress Notes (Signed)
HEMATOLOGY/ONCOLOGY TELE-MED VISIT NOTE  Date of Service: 11/20/2023  Patient Care Team: [name], MD as PCP - General (Family Medicine)  CHIEF COMPLAINTS/PURPOSE OF CONSULTATION:  evaluation and management of acute deep vein thrombosis of right lower extremity.   HISTORY OF PRESENTING ILLNESS:   Dana Fleming is a wonderful 82 y.o. female who is here for evaluation and management of acute deep vein thrombosis of right lower extremity.   She presented to the ED on 07/22/2023 and US showed findings consistent with acute deep vein thrombosis involving the right posterior tibial veins, and right peroneal veins of right leg.   Today, she reports that she has no other history of blood clots in the past. Patient has no fhx of blood clots or blood clotting diisorder. She reports a hx of DM and HTN.   She has a history of gout for 4 years, and is not on any medication for prevention. Patient has been previously recommended to monitor her diet to manage flares. Patient reports having a recent gout flare in her right foot. Her symptoms made it difficult to walk due to discomfort. Her leg swelling arose subsequently. She reports that her leg swelling began 2-3 weeks ago and fluctuates during the day. Patient did not endorse swelling in the thighs. Swelling was only present in the calf. She has no pain in her right leg at this time.  She reports that she presented to the ED on 07/22/23 due to sudden SOB. She denies having any cough, fever, or chills at that time. Ventilation and perfusion scan showed a right upper lobe lung clot and chest x-ray showed pneumonia in left upper lobe.  She has been on blood thinners for almost 2 months now.  Her SOB did improve with blood thinners. She has no respiratory symptoms including SOB or chest pain at this time.  She generally drinks milk and Pepsi regularly, but does not stay well hydrated with water intake. She sometimes puts lemon in her water. Patient  notes drinking 1 cup of coffee daily. She notes that she was taking cranberry juice for some time.   Patient denies any sudden change in bowel/urination habits. She reports fairly normal eating habits and denies any sudden weight loss. Patient denies any abdominal pain.  She report that when she was diagnosed with DM several years ago, she was on baby aspirin previously and developed Petechiae, and she subsequently stopped it.  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 Patient reports that she has not been able to exercise recently since her kidney removal. She reports that she has an upcoming CT scan with her urologist.   INTERVAL HISTORY:  Dana Fleming is a 82 y.o. female here for continued evaluation and management of acute deep vein thrombosis.  I connected with Sharlyn Bologna on 11/20/23 at  3:30 PM EST by telephone visit and verified that I am speaking with the correct person using two identifiers.   I last connected with the patient on 10/11/2023 and she was doing well overall.   Patient notes she has been doing well overall since our last visit. She does complain of bilateral leg swelling. She denies any new infection issues, fever, chills, night sweats, unexpected weight loss, back pain, chest pain, or leg swelling.   She has been elevating her legs. She denies wearing compression socks.   Patient notes she recently had CT abdomen, which is outside our system. Will get in touch with  her Dr. Berneice Heinrich for the results.   Discussed lab results from 11/06/2023 and CT scan results in detail with the patient.   I discussed the limitations, risks, security and privacy concerns of performing an evaluation and management service by telemedicine and the availability of in-person appointments. I also discussed with the patient that there may be a patient responsible charge related to this service. The patient expressed understanding and agreed to proceed.    Other persons participating in the visit and their role in the encounter: her niece  Patient's location: home  Provider's location: [facility]   Chief Complaint: continued evaluation and management of acute deep vein thrombosis    MEDICAL HISTORY:  Past Medical History:  Diagnosis Date   CKD (chronic kidney disease), stage III (HCC)    Diabetes mellitus without complication (HCC)    Type 2   Family history of adverse reaction to anesthesia    cousin didn't know he had sleep apnea and was on a ventilator for a month after   Hypertension    Sleep apnea    grandson has sleep apnea pt does not    SURGICAL HISTORY: Past Surgical History:  Procedure Laterality Date   CLOSED REDUCTION SHOULDER DISLOCATION     had conscious sedation   EYE SURGERY Bilateral    cataract surgery with lens implant   MULTIPLE TOOTH EXTRACTIONS     ROBOT ASSISTED LAPAROSCOPIC NEPHRECTOMY Right 03/29/2023   Procedure: XI ROBOTIC ASSISTED LAPAROSCOPIC NEPHRECTOMY;  Surgeon: Loletta Parish., MD;  Location: [facility];  Service: Urology;  Laterality: Right;  3 HRS   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   Procedure: REVERSE TOTAL SHOULDER ARTHROPLASTY;  Surgeon: Cammy Copa, MD;  Location: [facility];  Service: Orthopedics;  Laterality: Right;  REVERSE TOTAL SHOULDER ARTHROPLASTY    SOCIAL HISTORY: Social History   Socioeconomic History   Marital status: Single    Spouse name: Not on file   Number of children: Not on file   Years of education: Not on file   Highest education level: Not on file  Occupational History   Not on file  Tobacco Use   Smoking status: Former    Current packs/day: 0.00    Types: Cigarettes    Quit date: 01/13/1991    Years since quitting: 32.8   Smokeless tobacco: Never  Vaping Use   Vaping status: Never Used  Substance and Sexual Activity   Alcohol use: Yes    Comment: occasional   Drug use: No   Sexual activity: Not on file  Other Topics Concern   Not on file   Social History Narrative   Not on file   Social Drivers of Health   Financial Resource Strain: Not on file  Food Insecurity: No Food Insecurity (07/22/2023)   Hunger Vital Sign    Worried About Running Out of Food in the Last Year: Never true    Ran Out of Food in the Last Year: Never true  Transportation Needs: No Transportation Needs (07/22/2023)   PRAPARE - Administrator, Civil Service (Medical): No    Lack of Transportation (Non-Medical): No  Physical Activity: Not on file  Stress: Not on file  Social Connections: Not on file  Intimate Partner Violence: Not At Risk (07/22/2023)   Humiliation, Afraid, Rape, and Kick questionnaire    Fear of Current or Ex-Partner: No    Emotionally Abused: No    Physically Abused: No    Sexually Abused: No  FAMILY HISTORY: Family History  Problem Relation Age of Onset   Stroke Mother    Diabetes Mother    Diabetes Father    Heart attack Father     ALLERGIES:  is allergic to simvastatin.  MEDICATIONS:  Current Outpatient Medications  Medication Sig Dispense Refill   apixaban (ELIQUIS) 2.5 MG TABS tablet Take 1 tablet (2.5 mg total) by mouth 2 (two) times daily. 60 tablet 0   atorvastatin (LIPITOR) 20 MG tablet Take 20 mg by mouth at bedtime.  0   cholecalciferol (VITAMIN D3) 25 MCG (1000 UNIT) tablet Take 2,000 Units by mouth daily.     dapagliflozin propanediol (FARXIGA) 5 MG TABS tablet Take 5 mg by mouth daily.     iron polysaccharides (NIFEREX) 150 MG capsule Take 1 capsule (150 mg total) by mouth daily. 30 capsule 1   labetalol (NORMODYNE) 100 MG tablet Take 100 mg by mouth 2 (two) times daily.     OVER THE COUNTER MEDICATION Takes tart cherry and ginger over the counter     sodium bicarbonate 650 MG tablet Take 2 tablets (1,300 mg total) by mouth 2 (two) times daily. 120 tablet 3   sodium chloride (OCEAN) 0.65 % SOLN nasal spray Place 1 spray into both nostrils as needed for congestion (nose irritation). 44 mL 3    No current facility-administered medications for this visit.    REVIEW OF SYSTEMS:    10 Point review of Systems was done is negative except as noted above.   PHYSICAL EXAMINATION: TELEMEDICINE VISIT ECOG PERFORMANCE STATUS: 1 - Symptomatic but completely ambulatory  LABORATORY DATA:  I have reviewed the data as listed  .    Latest Ref Rng & Units 11/06/2023    9:38 AM 09/21/2023   12:45 PM 07/30/2023    4:21 AM  CBC  WBC 4.0 - 10.5 K/uL 4.8  5.7  5.6   Hemoglobin 12.0 - 15.0 g/dL 21.3  08.6  7.9   Hematocrit 36.0 - 46.0 % 33.4  36.6  25.5   Platelets 150 - 400 K/uL 207  292  224     .    Latest Ref Rng & Units 11/06/2023    9:38 AM 09/21/2023   12:45 PM 07/30/2023    4:21 AM  CMP  Glucose 70 - 99 mg/dL 85  87  91   BUN 8 - 23 mg/dL 34  39  27   Creatinine 0.44 - 1.00 mg/dL 5.78  4.69  6.29   Sodium 135 - 145 mmol/L 140  140  137   Potassium 3.5 - 5.1 mmol/L 4.2  4.1  4.8   Chloride 98 - 111 mmol/L 108  105  107   CO2 22 - 32 mmol/L 25  25  22    Calcium 8.9 - 10.3 mg/dL 9.6  9.9  9.1   Total Protein 6.5 - 8.1 g/dL 6.9  7.8    Total Bilirubin <1.2 mg/dL 0.8  0.7    Alkaline Phos 38 - 126 U/L 89  86    AST 15 - 41 U/L 38  61    ALT 0 - 44 U/L 36  61       RADIOGRAPHIC STUDIES: I have personally reviewed the radiological images as listed and agreed with the findings in the report. CT Chest Wo Contrast Result Date: 11/17/2023 CLINICAL DATA:  Kidney cancer.  Restaging.  * Tracking Code: BO * EXAM: CT CHEST WITHOUT CONTRAST TECHNIQUE: Multidetector CT imaging of the chest was  performed following the standard protocol without IV contrast. RADIATION DOSE REDUCTION: This exam was performed according to the departmental dose-optimization program which includes automated exposure control, adjustment of the mA and/or kV according to patient size and/or use of iterative reconstruction technique. COMPARISON:  07/22/2023 FINDINGS: Cardiovascular: Heart size upper normal to  borderline enlarged. No substantial pericardial effusion. Coronary artery calcification is evident. Mild atherosclerotic calcification is noted in the wall of the thoracic aorta. Enlargement of the pulmonary outflow tract/main pulmonary arteries suggests pulmonary arterial hypertension. Mediastinum/Nodes: No mediastinal lymphadenopathy. No evidence for gross hilar lymphadenopathy although assessment is limited by the lack of intravenous contrast on the current study. The esophagus has normal imaging features. Tiny hiatal hernia evident. There is no axillary lymphadenopathy. Lungs/Pleura: Fine architectural detail of lung parenchyma obscured by breathing motion during image acquisition. The patchy airspace disease seen previously in the left upper lobe has resolved in the interval. No new suspicious pulmonary nodule or mass. No focal airspace consolidation. There is no evidence of pleural effusion. Upper Abdomen: Right nephrectomy. Multiple cysts are noted within the partially visualized left kidney Musculoskeletal: No worrisome lytic or sclerotic osseous abnormality. Status post right shoulder replacement. IMPRESSION: 1. Interval resolution of the patchy airspace disease seen previously in the left upper lobe. 2. No evidence for metastatic disease in the chest. 3. Enlargement of the pulmonary outflow tract/main pulmonary arteries suggests pulmonary arterial hypertension. 4. Tiny hiatal hernia. 5.  Aortic Atherosclerosis (ICD10-I70.0). Electronically Signed   By: Kennith Center M.D.   On: 11/17/2023 13:44    ASSESSMENT & PLAN:  82 y.o.  female with  Acute deep vein thrombosis of right lower extremity  PLAN: -Discussed lab results from 11/06/2023 in detail with the patient.CBC shows patient is anemic with hemoglobin of 10.3 g/dL with hematocrit of 65.7%. CMP shows elevated BUN of 34 and elevated creatinine of 2.61. -Discussed CT Chest results from 11/16/2023 in detail with the patient. Showed Interval  resolution of the patchy airspace disease seen previously in the left upper lobe. No evidence for metastatic disease in the chest. Enlargement of the pulmonary outflow tract/main pulmonary arteries suggests pulmonary arterial hypertension. Tiny hiatal hernia. -Recommend to continue blood thinners for 6-9 months. Current dose of Eliquis.. continue eliquis 2.5 MG twice a day at this time. Then, change to baby aspirin.   FOLLOW-UP: RTC with PCP  The total time spent in the appointment was 20 minutes* .  All of the patient's questions were answered with apparent satisfaction. The patient knows to call the clinic with any problems, questions or concerns.   Wyvonnia Lora MD MS AAHIVMS [facility] Hematology/Oncology Physician [facility]  .*Total Encounter Time as defined by the Centers for Medicare and Medicaid Services includes, in addition to the face-to-face time of a patient visit (documented in the note above) non-face-to-face time: obtaining and reviewing outside history, ordering and reviewing medications, tests or procedures, care coordination (communications with other health care professionals or caregivers) and documentation in the medical record.   I,Param Shah,acting as a Neurosurgeon for Wyvonnia Lora, MD.,have documented all relevant documentation on the behalf of Wyvonnia Lora, MD,as directed by  Wyvonnia Lora, MD while in the presence of Wyvonnia Lora, MD.  .I have reviewed the above documentation for accuracy and completeness, and I agree with the above. Johney Maine MD

## 2023-12-02 ENCOUNTER — Other Ambulatory Visit: Payer: Self-pay | Admitting: Hematology

## 2023-12-02 DIAGNOSIS — I824Y1 Acute embolism and thrombosis of unspecified deep veins of right proximal lower extremity: Secondary | ICD-10-CM

## 2023-12-04 ENCOUNTER — Other Ambulatory Visit: Payer: Self-pay | Admitting: Hematology

## 2023-12-04 DIAGNOSIS — I824Y1 Acute embolism and thrombosis of unspecified deep veins of right proximal lower extremity: Secondary | ICD-10-CM

## 2023-12-04 MED ORDER — APIXABAN 2.5 MG PO TABS: 2.5000 mg | ORAL_TABLET | Freq: Two times a day (BID) | ORAL | 0 refills | Status: DC

## 2023-12-11 DIAGNOSIS — N184 Chronic kidney disease, stage 4 (severe): Secondary | ICD-10-CM | POA: Diagnosis not present

## 2023-12-21 DIAGNOSIS — I129 Hypertensive chronic kidney disease with stage 1 through stage 4 chronic kidney disease, or unspecified chronic kidney disease: Secondary | ICD-10-CM | POA: Diagnosis not present

## 2023-12-21 DIAGNOSIS — E872 Acidosis, unspecified: Secondary | ICD-10-CM | POA: Diagnosis not present

## 2023-12-21 DIAGNOSIS — N184 Chronic kidney disease, stage 4 (severe): Secondary | ICD-10-CM | POA: Diagnosis not present

## 2023-12-21 DIAGNOSIS — N189 Chronic kidney disease, unspecified: Secondary | ICD-10-CM | POA: Diagnosis not present

## 2023-12-21 DIAGNOSIS — I82409 Acute embolism and thrombosis of unspecified deep veins of unspecified lower extremity: Secondary | ICD-10-CM | POA: Diagnosis not present

## 2023-12-21 DIAGNOSIS — N2889 Other specified disorders of kidney and ureter: Secondary | ICD-10-CM | POA: Diagnosis not present

## 2023-12-21 DIAGNOSIS — E875 Hyperkalemia: Secondary | ICD-10-CM | POA: Diagnosis not present

## 2023-12-21 DIAGNOSIS — D631 Anemia in chronic kidney disease: Secondary | ICD-10-CM | POA: Diagnosis not present

## 2023-12-21 DIAGNOSIS — E1122 Type 2 diabetes mellitus with diabetic chronic kidney disease: Secondary | ICD-10-CM | POA: Diagnosis not present

## 2024-02-13 DIAGNOSIS — N184 Chronic kidney disease, stage 4 (severe): Secondary | ICD-10-CM | POA: Diagnosis not present

## 2024-02-13 DIAGNOSIS — E7800 Pure hypercholesterolemia, unspecified: Secondary | ICD-10-CM | POA: Diagnosis not present

## 2024-02-13 DIAGNOSIS — E1121 Type 2 diabetes mellitus with diabetic nephropathy: Secondary | ICD-10-CM | POA: Diagnosis not present

## 2024-02-13 DIAGNOSIS — E559 Vitamin D deficiency, unspecified: Secondary | ICD-10-CM | POA: Diagnosis not present

## 2024-02-13 DIAGNOSIS — I10 Essential (primary) hypertension: Secondary | ICD-10-CM | POA: Diagnosis not present

## 2024-02-14 ENCOUNTER — Other Ambulatory Visit: Payer: Self-pay | Admitting: Hematology

## 2024-02-19 DIAGNOSIS — I10 Essential (primary) hypertension: Secondary | ICD-10-CM | POA: Diagnosis not present

## 2024-02-19 DIAGNOSIS — N184 Chronic kidney disease, stage 4 (severe): Secondary | ICD-10-CM | POA: Diagnosis not present

## 2024-02-19 DIAGNOSIS — E1121 Type 2 diabetes mellitus with diabetic nephropathy: Secondary | ICD-10-CM | POA: Diagnosis not present

## 2024-02-21 ENCOUNTER — Ambulatory Visit: Admitting: Podiatry

## 2024-02-22 ENCOUNTER — Encounter: Payer: Self-pay | Admitting: Podiatry

## 2024-02-22 ENCOUNTER — Ambulatory Visit (INDEPENDENT_AMBULATORY_CARE_PROVIDER_SITE_OTHER): Admitting: Podiatry

## 2024-02-22 DIAGNOSIS — M79675 Pain in left toe(s): Secondary | ICD-10-CM | POA: Diagnosis not present

## 2024-02-22 DIAGNOSIS — B351 Tinea unguium: Secondary | ICD-10-CM | POA: Diagnosis not present

## 2024-02-22 DIAGNOSIS — E119 Type 2 diabetes mellitus without complications: Secondary | ICD-10-CM

## 2024-02-22 DIAGNOSIS — M79674 Pain in right toe(s): Secondary | ICD-10-CM | POA: Diagnosis not present

## 2024-02-22 NOTE — Progress Notes (Signed)
 Patient states she is subjective:   Patient ID: Dana Fleming, female   DOB: 83 y.o.   MRN: [id number]   HPI Been a somewhat long-term diabetic has had some neuropathic symptoms and has elongated nails 1-5 both feet that she is scared to take of herself and states that she also is on blood thinner.  Patient does not smoke likes to be active if possible   Review of Systems  All other systems reviewed and are negative.       Objective:  Physical Exam Vitals and nursing note reviewed.  Constitutional:      Appearance: She is well-developed.  Pulmonary:     Effort: Pulmonary effort is normal.  Musculoskeletal:        General: Normal range of motion.  Skin:    General: Skin is warm.  Neurological:     Mental Status: She is alert.     Neurovascular status intact muscle strength found to be adequate range of motion adequate with patient found to have incurvation of nailbeds 1-5 both feet that can get thickened and can be bothersome at times.  Patient does have moderate diminishment sharp dull vibratory bilateral mild indications of possible neurological deficit     Assessment:  Low-grade idiopathic condition with patient also on blood thinner who has elongated nailbeds 1-5 both feet that get thick and can get incurvated in the corners and tender     Plan:  H&P discussed continued control of A1c and daily inspections of feet and debrided nailbeds 1-5 both feet no iatrogenic bleeding will be seen back routine care

## 2024-02-23 ENCOUNTER — Ambulatory Visit
Admission: RE | Admit: 2024-02-23 | Discharge: 2024-02-23 | Disposition: A | Discharge status: Home / Self Care | Payer: Medicare Other | Source: Ambulatory Visit | Attending: Family Medicine | Admitting: Family Medicine

## 2024-02-23 DIAGNOSIS — N958 Other specified menopausal and perimenopausal disorders: Secondary | ICD-10-CM | POA: Diagnosis not present

## 2024-02-23 DIAGNOSIS — E2839 Other primary ovarian failure: Secondary | ICD-10-CM

## 2024-02-26 DIAGNOSIS — E7800 Pure hypercholesterolemia, unspecified: Secondary | ICD-10-CM | POA: Diagnosis not present

## 2024-02-26 DIAGNOSIS — N184 Chronic kidney disease, stage 4 (severe): Secondary | ICD-10-CM | POA: Diagnosis not present

## 2024-02-26 DIAGNOSIS — E1121 Type 2 diabetes mellitus with diabetic nephropathy: Secondary | ICD-10-CM | POA: Diagnosis not present

## 2024-02-26 DIAGNOSIS — I10 Essential (primary) hypertension: Secondary | ICD-10-CM | POA: Diagnosis not present

## 2024-03-18 DIAGNOSIS — N184 Chronic kidney disease, stage 4 (severe): Secondary | ICD-10-CM | POA: Diagnosis not present

## 2024-03-19 DIAGNOSIS — E1121 Type 2 diabetes mellitus with diabetic nephropathy: Secondary | ICD-10-CM | POA: Diagnosis not present

## 2024-03-19 DIAGNOSIS — I10 Essential (primary) hypertension: Secondary | ICD-10-CM | POA: Diagnosis not present

## 2024-03-19 DIAGNOSIS — N184 Chronic kidney disease, stage 4 (severe): Secondary | ICD-10-CM | POA: Diagnosis not present

## 2024-03-27 DIAGNOSIS — I82409 Acute embolism and thrombosis of unspecified deep veins of unspecified lower extremity: Secondary | ICD-10-CM | POA: Diagnosis not present

## 2024-03-27 DIAGNOSIS — N184 Chronic kidney disease, stage 4 (severe): Secondary | ICD-10-CM | POA: Diagnosis not present

## 2024-03-27 DIAGNOSIS — I10 Essential (primary) hypertension: Secondary | ICD-10-CM | POA: Diagnosis not present

## 2024-03-27 DIAGNOSIS — D631 Anemia in chronic kidney disease: Secondary | ICD-10-CM | POA: Diagnosis not present

## 2024-03-27 DIAGNOSIS — E872 Acidosis, unspecified: Secondary | ICD-10-CM | POA: Diagnosis not present

## 2024-03-27 DIAGNOSIS — N2889 Other specified disorders of kidney and ureter: Secondary | ICD-10-CM | POA: Diagnosis not present

## 2024-03-27 DIAGNOSIS — E875 Hyperkalemia: Secondary | ICD-10-CM | POA: Diagnosis not present

## 2024-03-27 DIAGNOSIS — E1121 Type 2 diabetes mellitus with diabetic nephropathy: Secondary | ICD-10-CM | POA: Diagnosis not present

## 2024-03-27 DIAGNOSIS — E7800 Pure hypercholesterolemia, unspecified: Secondary | ICD-10-CM | POA: Diagnosis not present

## 2024-03-27 DIAGNOSIS — I129 Hypertensive chronic kidney disease with stage 1 through stage 4 chronic kidney disease, or unspecified chronic kidney disease: Secondary | ICD-10-CM | POA: Diagnosis not present

## 2024-03-27 DIAGNOSIS — N189 Chronic kidney disease, unspecified: Secondary | ICD-10-CM | POA: Diagnosis not present

## 2024-03-28 ENCOUNTER — Ambulatory Visit: Admitting: Physician Assistant

## 2024-04-05 ENCOUNTER — Ambulatory Visit (INDEPENDENT_AMBULATORY_CARE_PROVIDER_SITE_OTHER): Admitting: Physician Assistant

## 2024-04-05 ENCOUNTER — Encounter: Payer: Self-pay | Admitting: Physician Assistant

## 2024-04-05 DIAGNOSIS — M1711 Unilateral primary osteoarthritis, right knee: Secondary | ICD-10-CM | POA: Diagnosis not present

## 2024-04-05 MED ORDER — LIDOCAINE HCL 1 % IJ SOLN
3.0000 mL | INTRAMUSCULAR | Status: AC | PRN
  Administered 2024-04-05: 3 mL

## 2024-04-05 MED ORDER — METHYLPREDNISOLONE ACETATE 40 MG/ML IJ SUSP
40.0000 mg | INTRAMUSCULAR | Status: AC | PRN
  Administered 2024-04-05: 40 mg via INTRA_ARTICULAR

## 2024-04-05 NOTE — Progress Notes (Signed)
 Office Visit Note   Patient: Dana Fleming           Date of Birth: 09-28-1941           MRN: [id number] Visit Date: 04/05/2024              Requested by: Sun, Vyvyan, MD [phone] [place],  [place] [address] PCP: Arvid Latino, MD  Chief Complaint  Patient presents with   Right Knee - Pain      HPI: Dana Fleming is a pleasant 83 year old woman with a history of arthritis in her right knee.  She periodically comes in for steroid injections and has done quite well.  Has had no new injury but is here for an injection today  Assessment & Plan: Visit Diagnoses:  1. Unilateral primary osteoarthritis, right knee     Plan: Went forward with a knee injection today she may follow-up as needed  Follow-Up Instructions: Return if symptoms worsen or fail to improve.   Ortho Exam  Patient is alert, oriented, no adenopathy, well-dressed, normal affect, normal respiratory effort. Right knee no effusion no erythema compartments are soft and compressible clinically valgus alignment  Imaging: No results found. No images are attached to the encounter.  Labs: Lab Results  Component Value Date   HGBA1C 6.1 (H) 03/16/2023   HGBA1C 7.4 (H) 01/19/2016   REPTSTATUS 01/15/2016 FINAL 01/14/2016   CULT MULTIPLE SPECIES PRESENT, SUGGEST RECOLLECTION 01/14/2016     Lab Results  Component Value Date   ALBUMIN  3.7 11/06/2023   ALBUMIN  3.6 09/21/2023   ALBUMIN  2.5 (L) 07/26/2023    Lab Results  Component Value Date   MG 2.1 07/26/2023   MG 1.6 (L) 07/25/2023   MG 1.7 07/24/2023   No results found for: "VD25OH"  No results found for: "PREALBUMIN"    Latest Ref Rng & Units 11/06/2023    9:38 AM 09/21/2023   12:45 PM 07/30/2023    4:21 AM  CBC EXTENDED  WBC 4.0 - 10.5 K/uL 4.8  5.7  5.6   RBC 3.87 - 5.11 MIL/uL 4.43  4.87  3.56   Hemoglobin 12.0 - 15.0 g/dL 78.2  95.6  7.9   HCT 21.3 - 46.0 % 33.4  36.6  25.5   Platelets 150 - 400 K/uL 207  292  224   NEUT# 1.7 - 7.7 K/uL  2.7  3.6    Lymph# 0.7 - 4.0 K/uL 1.5  1.6       There is no height or weight on file to calculate BMI.  Orders:  Orders Placed This Encounter  Procedures   Large Joint Inj   No orders of the defined types were placed in this encounter.    Procedures: Large Joint Inj: R knee on 04/05/2024 9:37 AM Indications: pain and diagnostic evaluation Details: 22 G 1.5 in needle, anteromedial approach  Arthrogram: No  Medications: 40 mg methylPREDNISolone  acetate 40 MG/ML; 3 mL lidocaine  1 % Outcome: tolerated well, no immediate complications Procedure, treatment alternatives, risks and benefits explained, specific risks discussed. Consent was given by the patient.      Clinical Data: No additional findings.  ROS:  All other systems negative, except as noted in the HPI. Review of Systems  Objective: Vital Signs: There were no vitals taken for this visit.  Specialty Comments:  No specialty comments available.  PMFS History: Patient Active Problem List   Diagnosis Date Noted   Unilateral primary osteoarthritis, right knee 11/15/2023   DVT (deep  venous thrombosis) (HCC) 07/22/2023   Acute pulmonary embolism (HCC) 07/22/2023   Shortness of breath 07/22/2023   AKI (acute kidney injury) (HCC) 07/22/2023   Non-ischemic myocardial injury (non-traumatic) 07/22/2023   Essential hypertension 07/22/2023   Metabolic acidosis 07/22/2023   CAP (community acquired pneumonia) 07/22/2023   Type 2 diabetes mellitus with chronic kidney disease, without long-term current use of insulin  (HCC) 07/22/2023   Hyperlipidemia 07/22/2023   Renal mass 03/29/2023   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   Past Medical History:  Diagnosis Date   CKD (chronic kidney disease), stage III (HCC)    Diabetes mellitus without complication (HCC)    Type 2   Family history of adverse reaction to anesthesia    cousin didn't know he had sleep apnea and was on a ventilator for a month after   Hypertension    Sleep  apnea    grandson has sleep apnea pt does not    Family History  Problem Relation Age of Onset   Stroke Mother    Diabetes Mother    Diabetes Father    Heart attack Father     Past Surgical History:  Procedure Laterality Date   CLOSED REDUCTION SHOULDER DISLOCATION     had conscious sedation   EYE SURGERY Bilateral    cataract surgery with lens implant   MULTIPLE TOOTH EXTRACTIONS     ROBOT ASSISTED LAPAROSCOPIC NEPHRECTOMY Right 03/29/2023   Procedure: XI ROBOTIC ASSISTED LAPAROSCOPIC NEPHRECTOMY;  Surgeon: Melody Spurling., MD;  Location: [facility];  Service: Urology;  Laterality: Right;  3 HRS   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   Procedure: REVERSE TOTAL SHOULDER ARTHROPLASTY;  Surgeon: Jasmine Mesi, MD;  Location: [facility];  Service: Orthopedics;  Laterality: Right;  REVERSE TOTAL SHOULDER ARTHROPLASTY   Social History   Occupational History   Not on file  Tobacco Use   Smoking status: Former    Current packs/day: 0.00    Types: Cigarettes    Quit date: 01/13/1991    Years since quitting: 33.2   Smokeless tobacco: Never  Vaping Use   Vaping status: Never Used  Substance and Sexual Activity   Alcohol use: Yes    Comment: occasional   Drug use: No   Sexual activity: Not on file

## 2024-04-08 ENCOUNTER — Other Ambulatory Visit: Payer: Self-pay | Admitting: Hematology

## 2024-04-08 DIAGNOSIS — I824Y1 Acute embolism and thrombosis of unspecified deep veins of right proximal lower extremity: Secondary | ICD-10-CM

## 2024-04-08 NOTE — Telephone Encounter (Signed)
 Patient has been discharge back to PCP  Dr .Sun, Vyvyan, MD and needs to continue f/u with PCP for long term refills. Thx GK

## 2024-04-18 DIAGNOSIS — I10 Essential (primary) hypertension: Secondary | ICD-10-CM | POA: Diagnosis not present

## 2024-04-18 DIAGNOSIS — E1121 Type 2 diabetes mellitus with diabetic nephropathy: Secondary | ICD-10-CM | POA: Diagnosis not present

## 2024-04-18 DIAGNOSIS — N184 Chronic kidney disease, stage 4 (severe): Secondary | ICD-10-CM | POA: Diagnosis not present

## 2024-04-24 ENCOUNTER — Other Ambulatory Visit: Payer: Self-pay | Admitting: Hematology

## 2024-04-27 DIAGNOSIS — E1121 Type 2 diabetes mellitus with diabetic nephropathy: Secondary | ICD-10-CM | POA: Diagnosis not present

## 2024-04-27 DIAGNOSIS — E7800 Pure hypercholesterolemia, unspecified: Secondary | ICD-10-CM | POA: Diagnosis not present

## 2024-04-27 DIAGNOSIS — N184 Chronic kidney disease, stage 4 (severe): Secondary | ICD-10-CM | POA: Diagnosis not present

## 2024-04-27 DIAGNOSIS — I10 Essential (primary) hypertension: Secondary | ICD-10-CM | POA: Diagnosis not present

## 2024-05-18 DIAGNOSIS — I10 Essential (primary) hypertension: Secondary | ICD-10-CM | POA: Diagnosis not present

## 2024-05-18 DIAGNOSIS — N184 Chronic kidney disease, stage 4 (severe): Secondary | ICD-10-CM | POA: Diagnosis not present

## 2024-05-18 DIAGNOSIS — E1121 Type 2 diabetes mellitus with diabetic nephropathy: Secondary | ICD-10-CM | POA: Diagnosis not present

## 2024-05-27 DIAGNOSIS — E7800 Pure hypercholesterolemia, unspecified: Secondary | ICD-10-CM | POA: Diagnosis not present

## 2024-05-27 DIAGNOSIS — N184 Chronic kidney disease, stage 4 (severe): Secondary | ICD-10-CM | POA: Diagnosis not present

## 2024-05-27 DIAGNOSIS — I10 Essential (primary) hypertension: Secondary | ICD-10-CM | POA: Diagnosis not present

## 2024-05-27 DIAGNOSIS — E1121 Type 2 diabetes mellitus with diabetic nephropathy: Secondary | ICD-10-CM | POA: Diagnosis not present

## 2024-06-17 DIAGNOSIS — N184 Chronic kidney disease, stage 4 (severe): Secondary | ICD-10-CM | POA: Diagnosis not present

## 2024-06-17 DIAGNOSIS — I10 Essential (primary) hypertension: Secondary | ICD-10-CM | POA: Diagnosis not present

## 2024-06-17 DIAGNOSIS — E1121 Type 2 diabetes mellitus with diabetic nephropathy: Secondary | ICD-10-CM | POA: Diagnosis not present

## 2024-06-18 ENCOUNTER — Other Ambulatory Visit: Payer: Self-pay | Admitting: Hematology

## 2024-06-27 DIAGNOSIS — E1121 Type 2 diabetes mellitus with diabetic nephropathy: Secondary | ICD-10-CM | POA: Diagnosis not present

## 2024-06-27 DIAGNOSIS — E7800 Pure hypercholesterolemia, unspecified: Secondary | ICD-10-CM | POA: Diagnosis not present

## 2024-06-27 DIAGNOSIS — N184 Chronic kidney disease, stage 4 (severe): Secondary | ICD-10-CM | POA: Diagnosis not present

## 2024-06-27 DIAGNOSIS — I10 Essential (primary) hypertension: Secondary | ICD-10-CM | POA: Diagnosis not present

## 2024-07-15 DIAGNOSIS — N184 Chronic kidney disease, stage 4 (severe): Secondary | ICD-10-CM | POA: Diagnosis not present

## 2024-07-17 DIAGNOSIS — I10 Essential (primary) hypertension: Secondary | ICD-10-CM | POA: Diagnosis not present

## 2024-07-17 DIAGNOSIS — E1121 Type 2 diabetes mellitus with diabetic nephropathy: Secondary | ICD-10-CM | POA: Diagnosis not present

## 2024-07-17 DIAGNOSIS — N184 Chronic kidney disease, stage 4 (severe): Secondary | ICD-10-CM | POA: Diagnosis not present

## 2024-07-26 DIAGNOSIS — N2889 Other specified disorders of kidney and ureter: Secondary | ICD-10-CM | POA: Diagnosis not present

## 2024-07-26 DIAGNOSIS — R609 Edema, unspecified: Secondary | ICD-10-CM | POA: Diagnosis not present

## 2024-07-26 DIAGNOSIS — D631 Anemia in chronic kidney disease: Secondary | ICD-10-CM | POA: Diagnosis not present

## 2024-07-26 DIAGNOSIS — I129 Hypertensive chronic kidney disease with stage 1 through stage 4 chronic kidney disease, or unspecified chronic kidney disease: Secondary | ICD-10-CM | POA: Diagnosis not present

## 2024-07-26 DIAGNOSIS — E872 Acidosis, unspecified: Secondary | ICD-10-CM | POA: Diagnosis not present

## 2024-07-26 DIAGNOSIS — N184 Chronic kidney disease, stage 4 (severe): Secondary | ICD-10-CM | POA: Diagnosis not present

## 2024-07-26 DIAGNOSIS — E875 Hyperkalemia: Secondary | ICD-10-CM | POA: Diagnosis not present

## 2024-07-28 DIAGNOSIS — N184 Chronic kidney disease, stage 4 (severe): Secondary | ICD-10-CM | POA: Diagnosis not present

## 2024-07-28 DIAGNOSIS — E7800 Pure hypercholesterolemia, unspecified: Secondary | ICD-10-CM | POA: Diagnosis not present

## 2024-07-28 DIAGNOSIS — E1121 Type 2 diabetes mellitus with diabetic nephropathy: Secondary | ICD-10-CM | POA: Diagnosis not present

## 2024-07-28 DIAGNOSIS — I10 Essential (primary) hypertension: Secondary | ICD-10-CM | POA: Diagnosis not present

## 2024-08-06 ENCOUNTER — Other Ambulatory Visit: Payer: Self-pay

## 2024-08-06 DIAGNOSIS — N179 Acute kidney failure, unspecified: Secondary | ICD-10-CM

## 2024-08-13 ENCOUNTER — Ambulatory Visit (INDEPENDENT_AMBULATORY_CARE_PROVIDER_SITE_OTHER): Admitting: Physician Assistant

## 2024-08-13 ENCOUNTER — Encounter: Payer: Self-pay | Admitting: Physician Assistant

## 2024-08-13 DIAGNOSIS — M1711 Unilateral primary osteoarthritis, right knee: Secondary | ICD-10-CM | POA: Diagnosis not present

## 2024-08-13 MED ORDER — METHYLPREDNISOLONE ACETATE 40 MG/ML IJ SUSP
40.0000 mg | INTRAMUSCULAR | Status: AC | PRN
  Administered 2024-08-13: 40 mg via INTRA_ARTICULAR

## 2024-08-13 MED ORDER — LIDOCAINE HCL 1 % IJ SOLN
4.0000 mL | INTRAMUSCULAR | Status: AC | PRN
  Administered 2024-08-13: 4 mL

## 2024-08-13 NOTE — Progress Notes (Signed)
 Office Visit Note   Patient: Dana Fleming           Date of Birth: 02/26/1941           MRN: [id number] Visit Date: 08/13/2024              Requested by: Sun, Vyvyan, MD [phone] [address] [place],  [place] [address] PCP: Austin Nutley, MD  Chief Complaint  Patient presents with   Right Knee - Pain    Patient is wanting injection in the right knee today        HPI: Patient is a pleasant 83 year old woman who occasionally comes in for steroid injection into her right knee.  She has had no new injury has had good luck with the injections  Assessment & Plan: Visit Diagnoses:  1. Unilateral primary osteoarthritis, right knee     Plan: Right knee injected without difficulty may follow-up as needed  Follow-Up Instructions: Return if symptoms worsen or fail to improve.   Ortho Exam  Patient is alert, oriented, no adenopathy, well-dressed, normal affect, normal respiratory effort. Right knee no effusion no erythema compartments are soft and compressible is neurovascular intact    Imaging: No results found. No images are attached to the encounter.  Labs: Lab Results  Component Value Date   HGBA1C 6.1 (H) 03/16/2023   HGBA1C 7.4 (H) 01/19/2016   REPTSTATUS 01/15/2016 FINAL 01/14/2016   CULT MULTIPLE SPECIES PRESENT, SUGGEST RECOLLECTION 01/14/2016     Lab Results  Component Value Date   ALBUMIN  3.7 11/06/2023   ALBUMIN  3.6 09/21/2023   ALBUMIN  2.5 (L) 07/26/2023    Lab Results  Component Value Date   MG 2.1 07/26/2023   MG 1.6 (L) 07/25/2023   MG 1.7 07/24/2023   No results found for: VD25OH  No results found for: PREALBUMIN    Latest Ref Rng & Units 11/06/2023    9:38 AM 09/21/2023   12:45 PM 07/30/2023    4:21 AM  CBC EXTENDED  WBC 4.0 - 10.5 K/uL 4.8  5.7  5.6   RBC 3.87 - 5.11 MIL/uL 4.43  4.87  3.56   Hemoglobin 12.0 - 15.0 g/dL 89.6  88.8  7.9   HCT 63.9 - 46.0 % 33.4  36.6  25.5   Platelets 150 - 400 K/uL 207  292  224   NEUT# 1.7 -  7.7 K/uL 2.7  3.6    Lymph# 0.7 - 4.0 K/uL 1.5  1.6       There is no height or weight on file to calculate BMI.  Orders:  Orders Placed This Encounter  Procedures   Large Joint Inj   No orders of the defined types were placed in this encounter.    Procedures: Large Joint Inj: R knee on 08/13/2024 9:37 AM Indications: pain and diagnostic evaluation Details: 25 G 1.5 in needle, anteromedial approach  Arthrogram: No  Medications: 40 mg methylPREDNISolone  acetate 40 MG/ML; 4 mL lidocaine  1 % Outcome: tolerated well, no immediate complications Procedure, treatment alternatives, risks and benefits explained, specific risks discussed. Consent was given by the patient.      Clinical Data: No additional findings.  ROS:  All other systems negative, except as noted in the HPI. Review of Systems  Objective: Vital Signs: There were no vitals taken for this visit.  Specialty Comments:  No specialty comments available.  PMFS History: Patient Active Problem List   Diagnosis Date Noted   Unilateral primary osteoarthritis, right knee 11/15/2023  DVT (deep venous thrombosis) (HCC) 07/22/2023   Acute pulmonary embolism (HCC) 07/22/2023   Shortness of breath 07/22/2023   AKI (acute kidney injury) (HCC) 07/22/2023   Non-ischemic myocardial injury (non-traumatic) 07/22/2023   Essential hypertension 07/22/2023   Metabolic acidosis 07/22/2023   CAP (community acquired pneumonia) 07/22/2023   Type 2 diabetes mellitus with chronic kidney disease, without long-term current use of insulin  (HCC) 07/22/2023   Hyperlipidemia 07/22/2023   Renal mass 03/29/2023   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   Past Medical History:  Diagnosis Date   CKD (chronic kidney disease), stage III (HCC)    Diabetes mellitus without complication (HCC)    Type 2   Family history of adverse reaction to anesthesia    cousin didn't know he had sleep apnea and was on a ventilator for a month after   Hypertension     Sleep apnea    grandson has sleep apnea pt does not    Family History  Problem Relation Age of Onset   Stroke Mother    Diabetes Mother    Diabetes Father    Heart attack Father     Past Surgical History:  Procedure Laterality Date   CLOSED REDUCTION SHOULDER DISLOCATION     had conscious sedation   EYE SURGERY Bilateral    cataract surgery with lens implant   MULTIPLE TOOTH EXTRACTIONS     ROBOT ASSISTED LAPAROSCOPIC NEPHRECTOMY Right 03/29/2023   Procedure: XI ROBOTIC ASSISTED LAPAROSCOPIC NEPHRECTOMY;  Surgeon: Alvaro Ricardo KATHEE Mickey., MD;  Location: [facility];  Service: Urology;  Laterality: Right;  3 HRS   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   Procedure: REVERSE TOTAL SHOULDER ARTHROPLASTY;  Surgeon: Glendia Cordella Hutchinson, MD;  Location: [facility];  Service: Orthopedics;  Laterality: Right;  REVERSE TOTAL SHOULDER ARTHROPLASTY   Social History   Occupational History   Not on file  Tobacco Use   Smoking status: Former    Current packs/day: 0.00    Types: Cigarettes    Quit date: 01/13/1991    Years since quitting: 33.6   Smokeless tobacco: Never  Vaping Use   Vaping status: Never Used  Substance and Sexual Activity   Alcohol use: Yes    Comment: occasional   Drug use: No   Sexual activity: Not on file

## 2024-08-16 DIAGNOSIS — N184 Chronic kidney disease, stage 4 (severe): Secondary | ICD-10-CM | POA: Diagnosis not present

## 2024-08-16 DIAGNOSIS — E1121 Type 2 diabetes mellitus with diabetic nephropathy: Secondary | ICD-10-CM | POA: Diagnosis not present

## 2024-08-16 DIAGNOSIS — I10 Essential (primary) hypertension: Secondary | ICD-10-CM | POA: Diagnosis not present

## 2024-08-27 DIAGNOSIS — N184 Chronic kidney disease, stage 4 (severe): Secondary | ICD-10-CM | POA: Diagnosis not present

## 2024-08-27 DIAGNOSIS — E7800 Pure hypercholesterolemia, unspecified: Secondary | ICD-10-CM | POA: Diagnosis not present

## 2024-08-27 DIAGNOSIS — I10 Essential (primary) hypertension: Secondary | ICD-10-CM | POA: Diagnosis not present

## 2024-08-27 DIAGNOSIS — E1121 Type 2 diabetes mellitus with diabetic nephropathy: Secondary | ICD-10-CM | POA: Diagnosis not present

## 2024-09-03 DIAGNOSIS — I10 Essential (primary) hypertension: Secondary | ICD-10-CM | POA: Diagnosis not present

## 2024-09-03 DIAGNOSIS — Z85528 Personal history of other malignant neoplasm of kidney: Secondary | ICD-10-CM | POA: Diagnosis not present

## 2024-09-03 DIAGNOSIS — E1121 Type 2 diabetes mellitus with diabetic nephropathy: Secondary | ICD-10-CM | POA: Diagnosis not present

## 2024-09-03 DIAGNOSIS — N2581 Secondary hyperparathyroidism of renal origin: Secondary | ICD-10-CM | POA: Diagnosis not present

## 2024-09-03 DIAGNOSIS — Z1331 Encounter for screening for depression: Secondary | ICD-10-CM | POA: Diagnosis not present

## 2024-09-03 DIAGNOSIS — E7800 Pure hypercholesterolemia, unspecified: Secondary | ICD-10-CM | POA: Diagnosis not present

## 2024-09-03 DIAGNOSIS — Z86718 Personal history of other venous thrombosis and embolism: Secondary | ICD-10-CM | POA: Diagnosis not present

## 2024-09-03 DIAGNOSIS — Z0000 Encounter for general adult medical examination without abnormal findings: Secondary | ICD-10-CM | POA: Diagnosis not present

## 2024-09-03 DIAGNOSIS — N184 Chronic kidney disease, stage 4 (severe): Secondary | ICD-10-CM | POA: Diagnosis not present

## 2024-09-04 ENCOUNTER — Telehealth: Payer: Self-pay

## 2024-09-04 ENCOUNTER — Ambulatory Visit: Admitting: Vascular Surgery

## 2024-09-04 ENCOUNTER — Ambulatory Visit (HOSPITAL_COMMUNITY)
Admission: RE | Admit: 2024-09-04 | Discharge: 2024-09-04 | Disposition: A | Discharge status: Home / Self Care | Source: Ambulatory Visit | Attending: Vascular Surgery | Admitting: Vascular Surgery

## 2024-09-04 ENCOUNTER — Ambulatory Visit (HOSPITAL_COMMUNITY): Admission: RE | Admit: 2024-09-04 | Discharge: 2024-09-04 | Attending: Vascular Surgery

## 2024-09-04 ENCOUNTER — Encounter: Payer: Self-pay | Admitting: Vascular Surgery

## 2024-09-04 VITALS — BP 143/73 | HR 60 | Temp 98.2°F | Ht 62.0 in | Wt 177.0 lb

## 2024-09-04 DIAGNOSIS — N179 Acute kidney failure, unspecified: Secondary | ICD-10-CM | POA: Insufficient documentation

## 2024-09-04 DIAGNOSIS — N184 Chronic kidney disease, stage 4 (severe): Secondary | ICD-10-CM | POA: Insufficient documentation

## 2024-09-04 NOTE — Telephone Encounter (Signed)
 Per [facility], patient will call for scheduling of L arm AVF VS AVG.

## 2024-09-04 NOTE — Progress Notes (Signed)
 Patient ID: Dana Fleming, female   DOB: 04/24/1941, 83 y.o.   MRN: [id number]  Reason for Consult: New Patient (Initial Visit)   Referred by Dana Fleming, *  Subjective:     HPI:  Dana Fleming is a 83 y.o. female with history of chronic kidney disease.  She has never required dialysis in the past.  She is right-hand dominant without history of chest, breast or upper extremity surgeries.  We have been asked to place AV fistula and wait for AV graft.  Patient states that she does not have suitable veins for IV placements typically.  She does not take any blood thinners.  Past Medical History:  Diagnosis Date   CKD (chronic kidney disease), stage III (HCC)    Diabetes mellitus without complication (HCC)    Type 2   Family history of adverse reaction to anesthesia    cousin didn't know he had sleep apnea and was on a ventilator for a month after   Hypertension    Sleep apnea    grandson has sleep apnea pt does not   Family History  Problem Relation Age of Onset   Stroke Mother    Diabetes Mother    Diabetes Father    Heart attack Father    Past Surgical History:  Procedure Laterality Date   CLOSED REDUCTION SHOULDER DISLOCATION     had conscious sedation   EYE SURGERY Bilateral    cataract surgery with lens implant   MULTIPLE TOOTH EXTRACTIONS     ROBOT ASSISTED LAPAROSCOPIC NEPHRECTOMY Right 03/29/2023   Procedure: XI ROBOTIC ASSISTED LAPAROSCOPIC NEPHRECTOMY;  Surgeon: Alvaro Ricardo KATHEE Mickey., MD;  Location: [facility];  Service: Urology;  Laterality: Right;  3 HRS   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   Procedure: REVERSE TOTAL SHOULDER ARTHROPLASTY;  Surgeon: Glendia Cordella Hutchinson, MD;  Location: [facility];  Service: Orthopedics;  Laterality: Right;  REVERSE TOTAL SHOULDER ARTHROPLASTY    Short Social History:  Social History   Tobacco Use   Smoking status: Former    Current packs/day: 0.00    Types: Cigarettes    Quit date: 01/13/1991    Years since quitting: 33.6    Smokeless tobacco: Never  Substance Use Topics   Alcohol use: Yes    Comment: occasional    Allergies  Allergen Reactions   Simvastatin Cough    Current Outpatient Medications  Medication Sig Dispense Refill   atorvastatin  (LIPITOR) 20 MG tablet Take 20 mg by mouth at bedtime.  0   cholecalciferol  (VITAMIN D3) 25 MCG (1000 UNIT) tablet Take 2,000 Units by mouth daily.     ELIQUIS  2.5 MG TABS tablet TAKE 1 TABLET BY MOUTH TWICE A DAY 60 tablet 0   iron  polysaccharides (NIFEREX) 150 MG capsule TAKE 1 CAPSULE BY MOUTH EVERY DAY 90 capsule 1   labetalol  (NORMODYNE ) 100 MG tablet Take 100 mg by mouth 2 (two) times daily.     OVER THE COUNTER MEDICATION Takes tart cherry and ginger over the counter     sodium bicarbonate  650 MG tablet Take 2 tablets (1,300 mg total) by mouth 2 (two) times daily. 120 tablet 3   sodium chloride  (OCEAN) 0.65 % SOLN nasal spray Place 1 spray into both nostrils as needed for congestion (nose irritation). 44 mL 3   No current facility-administered medications for this visit.    Review of Systems  Constitutional:  Constitutional negative. HENT: HENT negative.  Eyes: Eyes negative.  Respiratory: Respiratory negative.  Cardiovascular: Cardiovascular  negative.  GI: Gastrointestinal negative.  Musculoskeletal: Musculoskeletal negative.  Skin: Skin negative.  Neurological: Neurological negative. Hematologic: Hematologic/lymphatic negative.  Psychiatric: Psychiatric negative.        Objective:  Objective   Vitals:   09/04/24 0940  BP: (!) 143/73  Pulse: 60  Temp: 98.2 F (36.8 C)  SpO2: 98%  Weight: 177 lb (80.3 kg)  Height: 5' 2 (1.575 m)   Body mass index is 32.37 kg/m.  Physical Exam HENT:     Head: Normocephalic.     Nose: Nose normal.     Mouth/Throat:     Mouth: Mucous membranes are moist.  Cardiovascular:     Rate and Rhythm: Normal rate.     Pulses: Normal pulses.  Pulmonary:     Effort: Pulmonary effort is normal.   Abdominal:     General: Abdomen is flat.  Musculoskeletal:     Cervical back: Normal range of motion and neck supple.     Comments: No visible surface veins bilateral upper extremities  Skin:    General: Skin is warm.     Capillary Refill: Capillary refill takes less than 2 seconds.  Neurological:     General: No focal deficit present.     Mental Status: She is alert. Mental status is at baseline.     Data: Right Pre-Dialysis Findings:  +-----------------------+----------+--------------------+---------+--------  +  Location              PSV (cm/s)Intralum. Diam. (cm)Waveform  Comments  +-----------------------+----------+--------------------+---------+--------  +  Brachial Antecub. fossa98        0.40                triphasic           +-----------------------+----------+--------------------+---------+--------  +  Radial Art at Wrist    94        0.17                triphasic           +-----------------------+----------+--------------------+---------+--------  +  Ulnar Art at Wrist     74        0.13                triphasic           +-----------------------+----------+--------------------+---------+--------     Left Pre-Dialysis Findings:  +-----------------------+----------+--------------------+---------+--------  +  Location              PSV (cm/s)Intralum. Diam. (cm)Waveform  Comments  +-----------------------+----------+--------------------+---------+--------  +  Brachial Antecub. fossa74        0.52                triphasic           +-----------------------+----------+--------------------+---------+--------  +  Radial Art at Wrist    123       0.17                triphasic           +-----------------------+----------+--------------------+---------+--------  +  Ulnar Art at Wrist     57        0.12                triphasic            +-----------------------+----------+--------------------+---------+--------  +  +-----------------+-------------+----------+---------+  Right Cephalic   Diameter (cm)Depth (cm)Findings   +-----------------+-------------+----------+---------+  Shoulder            0.18        1.46              +-----------------+-------------+----------+---------+  Prox upper arm       0.14        1.55              +-----------------+-------------+----------+---------+  Mid upper arm        0.23        1.14              +-----------------+-------------+----------+---------+  Dist upper arm       0.17        0.60              +-----------------+-------------+----------+---------+  Antecubital fossa    0.36        0.22   branching  +-----------------+-------------+----------+---------+  Prox forearm         0.18        0.93              +-----------------+-------------+----------+---------+  Mid forearm          0.15        0.25              +-----------------+-------------+----------+---------+  Wrist               0.15        0.17              +-----------------+-------------+----------+---------+   +-----------------+-------------+----------+---------+  Right Basilic    Diameter (cm)Depth (cm)Findings   +-----------------+-------------+----------+---------+  Mid upper arm        0.34        1.96              +-----------------+-------------+----------+---------+  Dist upper arm       0.36        1.02              +-----------------+-------------+----------+---------+  Antecubital fossa    0.31        0.78   branching  +-----------------+-------------+----------+---------+   +-----------------+-------------+----------+---------+  Left Cephalic    Diameter (cm)Depth (cm)Findings   +-----------------+-------------+----------+---------+  Shoulder            0.27        2.02               +-----------------+-------------+----------+---------+  Prox upper arm       0.22        1.62              +-----------------+-------------+----------+---------+  Mid upper arm        0.22        1.00              +-----------------+-------------+----------+---------+  Dist upper arm       0.17        0.86              +-----------------+-------------+----------+---------+  Antecubital fossa    0.34        0.59   branching  +-----------------+-------------+----------+---------+  Prox forearm         0.21        0.83              +-----------------+-------------+----------+---------+  Mid forearm          0.19        0.50              +-----------------+-------------+----------+---------+  Wrist               0.15        0.19              +-----------------+-------------+----------+---------+   +-----------------+-------------+----------+---------+  Left Basilic     Diameter (cm)Depth (cm)Findings   +-----------------+-------------+----------+---------+  Mid upper arm        0.24        1.60              +-----------------+-------------+----------+---------+  Dist upper arm       0.21        1.09              +-----------------+-------------+----------+---------+  Antecubital fossa    0.18        0.90   branching  +-----------------+-------------+----------+---------+  Prox forearm         0.13        0.32              +-----------------+-------------+----------+---------+  Mid forearm          0.11        0.15              +-----------------+-------------+----------+---------+        Assessment/Plan:    83 year old female right hand dominance with chronic kidney disease in need of permanent dialysis access.  We discussed her options for hemodialysis including catheter versus fistula versus graft.  She has only marginal vein for dialysis creation we have been asked to wait on placing an AV graft.  As such she will be  scheduled when she approaches dialysis.  We discussed the risk benefits and alternatives as well as need for future procedures and she demonstrates good understanding.     Penne Lonni Colorado MD [facility]

## 2024-09-15 DIAGNOSIS — E1121 Type 2 diabetes mellitus with diabetic nephropathy: Secondary | ICD-10-CM | POA: Diagnosis not present

## 2024-09-15 DIAGNOSIS — N184 Chronic kidney disease, stage 4 (severe): Secondary | ICD-10-CM | POA: Diagnosis not present

## 2024-09-15 DIAGNOSIS — I10 Essential (primary) hypertension: Secondary | ICD-10-CM | POA: Diagnosis not present

## 2024-09-25 DIAGNOSIS — N184 Chronic kidney disease, stage 4 (severe): Secondary | ICD-10-CM | POA: Diagnosis not present

## 2024-09-25 DIAGNOSIS — R310 Gross hematuria: Secondary | ICD-10-CM | POA: Diagnosis not present

## 2024-09-27 DIAGNOSIS — E1121 Type 2 diabetes mellitus with diabetic nephropathy: Secondary | ICD-10-CM | POA: Diagnosis not present

## 2024-09-27 DIAGNOSIS — I10 Essential (primary) hypertension: Secondary | ICD-10-CM | POA: Diagnosis not present

## 2024-09-27 DIAGNOSIS — E7800 Pure hypercholesterolemia, unspecified: Secondary | ICD-10-CM | POA: Diagnosis not present

## 2024-09-27 DIAGNOSIS — N184 Chronic kidney disease, stage 4 (severe): Secondary | ICD-10-CM | POA: Diagnosis not present

## 2024-09-30 ENCOUNTER — Encounter: Payer: Self-pay | Admitting: Radiology

## 2024-10-02 ENCOUNTER — Other Ambulatory Visit (HOSPITAL_COMMUNITY): Payer: Self-pay | Admitting: Nephrology

## 2024-10-02 DIAGNOSIS — I129 Hypertensive chronic kidney disease with stage 1 through stage 4 chronic kidney disease, or unspecified chronic kidney disease: Secondary | ICD-10-CM | POA: Diagnosis not present

## 2024-10-02 DIAGNOSIS — D631 Anemia in chronic kidney disease: Secondary | ICD-10-CM | POA: Insufficient documentation

## 2024-10-02 DIAGNOSIS — R609 Edema, unspecified: Secondary | ICD-10-CM | POA: Diagnosis not present

## 2024-10-02 DIAGNOSIS — N2889 Other specified disorders of kidney and ureter: Secondary | ICD-10-CM | POA: Diagnosis not present

## 2024-10-02 DIAGNOSIS — N184 Chronic kidney disease, stage 4 (severe): Secondary | ICD-10-CM | POA: Diagnosis not present

## 2024-10-02 DIAGNOSIS — E875 Hyperkalemia: Secondary | ICD-10-CM | POA: Diagnosis not present

## 2024-10-02 DIAGNOSIS — E872 Acidosis, unspecified: Secondary | ICD-10-CM | POA: Diagnosis not present

## 2024-10-03 ENCOUNTER — Other Ambulatory Visit (HOSPITAL_COMMUNITY): Payer: Self-pay | Admitting: Nephrology

## 2024-10-03 ENCOUNTER — Telehealth (HOSPITAL_COMMUNITY): Payer: Self-pay | Admitting: Pharmacy Technician

## 2024-10-03 DIAGNOSIS — D631 Anemia in chronic kidney disease: Secondary | ICD-10-CM | POA: Insufficient documentation

## 2024-10-03 NOTE — Telephone Encounter (Signed)
 Auth Submission: NO AUTH NEEDED Site of care: [facility] Payer: Medicare A/B, [place] [employer]  Medication & CPT/J Code(s) submitted: Feraheme (ferumoxytol ) [id number] Diagnosis Code: N18.9, D63.1 Route of submission (phone, fax, portal):  Phone # Fax # Auth type: Buy/Bill HB Units/visits requested: 510mg  x 2 doses Reference number:  Approval from: 10/03/2024 to 12/28/24    Dagoberto Armour, CPhT [facility] Phone: [phone] 10/03/2024

## 2024-10-04 ENCOUNTER — Telehealth (HOSPITAL_COMMUNITY): Payer: Self-pay | Admitting: Pharmacy Technician

## 2024-10-04 NOTE — Telephone Encounter (Signed)
 Auth Submission: NO AUTH NEEDED Site of care: [facility] Payer: Medicare A/B, [place] [employer]  Medication & CPT/J Code(s) submitted: Q5106 - PR INJ RETACRIT NON-ESRD USE Diagnosis Code: N18.4, D63.1  Route of submission (phone, fax, portal):  Phone # Fax # Auth type: Buy/Bill HB Units/visits requested: 10,000 units every 2 weeks Reference number:  Approval from: 10/04/2024 to 12/28/24    Dagoberto Armour, CPhT [facility] Phone: [phone] 10/04/2024

## 2024-10-16 ENCOUNTER — Encounter (HOSPITAL_COMMUNITY)
Admission: RE | Admit: 2024-10-16 | Discharge: 2024-10-16 | Disposition: A | Discharge status: Home / Self Care | Source: Ambulatory Visit | Attending: Nephrology | Admitting: Nephrology

## 2024-10-16 ENCOUNTER — Encounter (HOSPITAL_COMMUNITY)
Admission: RE | Admit: 2024-10-16 | Discharge: 2024-10-16 | Disposition: A | Source: Ambulatory Visit | Attending: Nephrology

## 2024-10-16 VITALS — BP 159/65 | HR 63 | Temp 98.1°F | Resp 16

## 2024-10-16 DIAGNOSIS — N184 Chronic kidney disease, stage 4 (severe): Secondary | ICD-10-CM | POA: Diagnosis not present

## 2024-10-16 DIAGNOSIS — N189 Chronic kidney disease, unspecified: Secondary | ICD-10-CM | POA: Insufficient documentation

## 2024-10-16 DIAGNOSIS — D631 Anemia in chronic kidney disease: Secondary | ICD-10-CM | POA: Diagnosis present

## 2024-10-16 LAB — POCT HEMOGLOBIN-HEMACUE: Hemoglobin: 8.9 g/dL — ABNORMAL LOW (ref 12.0–15.0)

## 2024-10-16 MED ORDER — EPOETIN ALFA-EPBX 10000 UNIT/ML IJ SOLN
INTRAMUSCULAR | Status: AC
  Filled 2024-10-16: qty 1

## 2024-10-16 MED ORDER — SODIUM CHLORIDE 0.9 % IV SOLN
510.0000 mg | Freq: Once | INTRAVENOUS | Status: AC
  Administered 2024-10-16: 510 mg via INTRAVENOUS
  Filled 2024-10-16: qty 510

## 2024-10-16 MED ORDER — EPOETIN ALFA-EPBX 10000 UNIT/ML IJ SOLN
10000.0000 [IU] | Freq: Once | INTRAMUSCULAR | Status: AC
  Administered 2024-10-16: 10000 [IU] via SUBCUTANEOUS

## 2024-10-18 DIAGNOSIS — C641 Malignant neoplasm of right kidney, except renal pelvis: Secondary | ICD-10-CM | POA: Diagnosis not present

## 2024-10-18 DIAGNOSIS — Z85528 Personal history of other malignant neoplasm of kidney: Secondary | ICD-10-CM | POA: Diagnosis not present

## 2024-10-18 DIAGNOSIS — R918 Other nonspecific abnormal finding of lung field: Secondary | ICD-10-CM | POA: Diagnosis not present

## 2024-10-18 DIAGNOSIS — N281 Cyst of kidney, acquired: Secondary | ICD-10-CM | POA: Diagnosis not present

## 2024-10-29 DIAGNOSIS — Z905 Acquired absence of kidney: Secondary | ICD-10-CM | POA: Diagnosis not present

## 2024-10-29 DIAGNOSIS — C641 Malignant neoplasm of right kidney, except renal pelvis: Secondary | ICD-10-CM | POA: Diagnosis not present

## 2024-10-29 DIAGNOSIS — N1832 Chronic kidney disease, stage 3b: Secondary | ICD-10-CM | POA: Diagnosis not present

## 2024-10-30 ENCOUNTER — Inpatient Hospital Stay (HOSPITAL_COMMUNITY)
Admission: RE | Admit: 2024-10-30 | Discharge: 2024-10-30 | Disposition: A | Source: Ambulatory Visit | Attending: Nephrology

## 2024-10-30 ENCOUNTER — Encounter (HOSPITAL_COMMUNITY)

## 2024-10-30 ENCOUNTER — Encounter (HOSPITAL_COMMUNITY)
Admission: RE | Admit: 2024-10-30 | Discharge: 2024-10-30 | Disposition: A | Source: Ambulatory Visit | Attending: Nephrology

## 2024-10-30 VITALS — BP 156/69 | HR 62 | Temp 97.9°F | Resp 16

## 2024-10-30 VITALS — BP 153/74 | HR 57 | Resp 16

## 2024-10-30 DIAGNOSIS — D631 Anemia in chronic kidney disease: Secondary | ICD-10-CM | POA: Insufficient documentation

## 2024-10-30 DIAGNOSIS — N184 Chronic kidney disease, stage 4 (severe): Secondary | ICD-10-CM | POA: Insufficient documentation

## 2024-10-30 DIAGNOSIS — N189 Chronic kidney disease, unspecified: Secondary | ICD-10-CM | POA: Diagnosis present

## 2024-10-30 LAB — POCT HEMOGLOBIN-HEMACUE: Hemoglobin: 9.8 g/dL — ABNORMAL LOW (ref 12.0–15.0)

## 2024-10-30 MED ORDER — SODIUM CHLORIDE 0.9 % IV SOLN
510.0000 mg | Freq: Once | INTRAVENOUS | Status: AC
  Administered 2024-10-30: 510 mg via INTRAVENOUS
  Filled 2024-10-30: qty 510

## 2024-10-30 MED ORDER — EPOETIN ALFA-EPBX 10000 UNIT/ML IJ SOLN
10000.0000 [IU] | Freq: Once | INTRAMUSCULAR | Status: AC
  Administered 2024-10-30: 10000 [IU] via SUBCUTANEOUS

## 2024-10-30 MED ORDER — EPOETIN ALFA-EPBX 10000 UNIT/ML IJ SOLN
INTRAMUSCULAR | Status: AC
  Filled 2024-10-30: qty 1

## 2024-11-13 ENCOUNTER — Inpatient Hospital Stay (HOSPITAL_COMMUNITY): Admission: RE | Admit: 2024-11-13 | Discharge: 2024-11-13 | Attending: Nephrology

## 2024-11-13 VITALS — BP 166/60 | HR 60 | Temp 97.6°F | Resp 16

## 2024-11-13 DIAGNOSIS — D631 Anemia in chronic kidney disease: Secondary | ICD-10-CM

## 2024-11-13 DIAGNOSIS — N184 Chronic kidney disease, stage 4 (severe): Secondary | ICD-10-CM | POA: Diagnosis not present

## 2024-11-13 LAB — FERRITIN: Ferritin: 674 ng/mL — ABNORMAL HIGH (ref 11–307)

## 2024-11-13 LAB — IRON AND TIBC
Iron: 91 ug/dL (ref 28–170)
Saturation Ratios: 30 % (ref 10.4–31.8)
TIBC: 302 ug/dL (ref 250–450)
UIBC: 212 ug/dL

## 2024-11-13 LAB — POCT HEMOGLOBIN-HEMACUE: Hemoglobin: 10.6 g/dL — ABNORMAL LOW (ref 12.0–15.0)

## 2024-11-13 MED ORDER — EPOETIN ALFA-EPBX 10000 UNIT/ML IJ SOLN
10000.0000 [IU] | Freq: Once | INTRAMUSCULAR | Status: AC
  Administered 2024-11-13: 08:00:00 10000 [IU] via SUBCUTANEOUS

## 2024-11-13 MED ORDER — EPOETIN ALFA-EPBX 10000 UNIT/ML IJ SOLN
INTRAMUSCULAR | Status: AC
  Filled 2024-11-13: qty 1

## 2024-11-26 ENCOUNTER — Ambulatory Visit (INDEPENDENT_AMBULATORY_CARE_PROVIDER_SITE_OTHER): Admitting: Physician Assistant

## 2024-11-26 DIAGNOSIS — M1711 Unilateral primary osteoarthritis, right knee: Secondary | ICD-10-CM

## 2024-11-26 MED ORDER — METHYLPREDNISOLONE ACETATE 40 MG/ML IJ SUSP
40.0000 mg | INTRAMUSCULAR | Status: AC | PRN
  Administered 2024-11-26: 40 mg via INTRA_ARTICULAR

## 2024-11-26 MED ORDER — LIDOCAINE HCL 1 % IJ SOLN
3.0000 mL | INTRAMUSCULAR | Status: AC | PRN
  Administered 2024-11-26: 3 mL

## 2024-11-26 NOTE — Progress Notes (Signed)
 "  Office Visit Note   Patient: Dana Fleming           Date of Birth: [birth year]           MRN: [id number] Visit Date: 11/26/2024              Requested by: Sun, Vyvyan, MD [phone] [address] [place],  [place] [address] PCP: Austin Nutley, MD  Chief Complaint  Patient presents with   Right Knee - Injections    Slight pain       HPI: Patient is a pleasant 83 year old woman who comes in periodically for steroid injections into her right knee the last one was over 3 months ago she has had no new injury just requesting another injection  Assessment & Plan: Visit Diagnoses:  1. Unilateral primary osteoarthritis, right knee     Plan: Injection administered without difficulty May follow-up as needed  Follow-Up Instructions: Return if symptoms worsen or fail to improve.   Ortho Exam  Patient is alert, oriented, no adenopathy, well-dressed, normal affect, normal respiratory effort. Right knee no effusion no erythema compartments are soft and compressible no evidence of infection negative [place]' sign she is neurovascular intact    Imaging: No results found. No images are attached to the encounter.  Labs: Lab Results  Component Value Date   HGBA1C 6.1 (H) 03/16/2023   HGBA1C 7.4 (H) 01/19/2016   REPTSTATUS 01/15/2016 FINAL 01/14/2016   CULT MULTIPLE SPECIES PRESENT, SUGGEST RECOLLECTION 01/14/2016     Lab Results  Component Value Date   ALBUMIN  3.7 11/06/2023   ALBUMIN  3.6 09/21/2023   ALBUMIN  2.5 (L) 07/26/2023    Lab Results  Component Value Date   MG 2.1 07/26/2023   MG 1.6 (L) 07/25/2023   MG 1.7 07/24/2023   No results found for: VD25OH  No results found for: PREALBUMIN    Latest Ref Rng & Units 11/13/2024    8:13 AM 10/30/2024    8:11 AM 10/16/2024    8:30 AM  CBC EXTENDED  Hemoglobin 12.0 - 15.0 g/dL 89.3  9.8  8.9      There is no height or weight on file to calculate BMI.  Orders:  No orders of the defined types were placed in  this encounter.  No orders of the defined types were placed in this encounter.    Procedures: Large Joint Inj: R knee on 11/26/2024 9:31 AM Indications: pain and diagnostic evaluation Details: 25 G 1.5 in needle, anteromedial approach  Arthrogram: No  Medications: 40 mg methylPREDNISolone  acetate 40 MG/ML; 3 mL lidocaine  1 % Outcome: tolerated well, no immediate complications Procedure, treatment alternatives, risks and benefits explained, specific risks discussed. Consent was given by the patient.     Clinical Data: No additional findings.  ROS:  All other systems negative, except as noted in the HPI. Review of Systems  Objective: Vital Signs: There were no vitals taken for this visit.  Specialty Comments:  No specialty comments available.  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) 10/03/2024   Anemia of chronic renal failure 10/02/2024   Unilateral primary osteoarthritis, right knee 11/15/2023   DVT (deep venous thrombosis) (HCC) 07/22/2023   Acute pulmonary embolism (HCC) 07/22/2023   Shortness of breath 07/22/2023   AKI (acute kidney injury) 07/22/2023   Non-ischemic myocardial injury (non-traumatic) 07/22/2023   Essential hypertension 07/22/2023   Metabolic acidosis 07/22/2023  CAP (community acquired pneumonia) 07/22/2023   Type 2 diabetes mellitus with chronic kidney disease, without long-term current use of insulin  (HCC) 07/22/2023   Hyperlipidemia 07/22/2023   Renal mass 03/29/2023   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   Past Medical History:  Diagnosis Date   CKD (chronic kidney disease), stage III (HCC)    Diabetes mellitus without complication (HCC)    Type 2   Family history of adverse reaction to anesthesia    cousin didn't know he had sleep apnea and was on a ventilator for a month after   Hypertension    Sleep apnea     grandson has sleep apnea pt does not    Family History  Problem Relation Age of Onset   Stroke Mother    Diabetes Mother    Diabetes Father    Heart attack Father     Past Surgical History:  Procedure Laterality Date   CLOSED REDUCTION SHOULDER DISLOCATION     had conscious sedation   EYE SURGERY Bilateral    cataract surgery with lens implant   MULTIPLE TOOTH EXTRACTIONS     ROBOT ASSISTED LAPAROSCOPIC NEPHRECTOMY Right 03/29/2023   Procedure: XI ROBOTIC ASSISTED LAPAROSCOPIC NEPHRECTOMY;  Surgeon: Alvaro Ricardo KATHEE Mickey., MD;  Location: [facility];  Service: Urology;  Laterality: Right;  3 HRS   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   Procedure: REVERSE TOTAL SHOULDER ARTHROPLASTY;  Surgeon: Glendia Cordella Hutchinson, MD;  Location: [facility];  Service: Orthopedics;  Laterality: Right;  REVERSE TOTAL SHOULDER ARTHROPLASTY   Social History   Occupational History   Not on file  Tobacco Use   Smoking status: Former    Current packs/day: 0.00    Types: Cigarettes    Quit date: 01/13/1991    Years since quitting: 33.8   Smokeless tobacco: Never  Vaping Use   Vaping status: Never Used  Substance and Sexual Activity   Alcohol use: Yes    Comment: occasional   Drug use: No   Sexual activity: Not on file       "

## 2024-11-27 ENCOUNTER — Encounter (HOSPITAL_COMMUNITY)
Admission: RE | Admit: 2024-11-27 | Discharge: 2024-11-27 | Disposition: A | Discharge status: Home / Self Care | Source: Ambulatory Visit | Attending: Nephrology | Admitting: Nephrology

## 2024-11-27 VITALS — BP 175/69 | HR 72 | Temp 97.7°F | Resp 16

## 2024-11-27 DIAGNOSIS — N184 Chronic kidney disease, stage 4 (severe): Secondary | ICD-10-CM | POA: Diagnosis not present

## 2024-11-27 DIAGNOSIS — D631 Anemia in chronic kidney disease: Secondary | ICD-10-CM

## 2024-11-27 LAB — POCT HEMOGLOBIN-HEMACUE: Hemoglobin: 11.4 g/dL — ABNORMAL LOW (ref 12.0–15.0)

## 2024-11-27 MED ORDER — EPOETIN ALFA-EPBX 10000 UNIT/ML IJ SOLN
10000.0000 [IU] | Freq: Once | INTRAMUSCULAR | Status: AC
  Administered 2024-11-27: 10000 [IU] via SUBCUTANEOUS

## 2024-11-27 MED ORDER — EPOETIN ALFA-EPBX 10000 UNIT/ML IJ SOLN
INTRAMUSCULAR | Status: AC
  Filled 2024-11-27: qty 1

## 2024-12-11 ENCOUNTER — Inpatient Hospital Stay (HOSPITAL_COMMUNITY)
Admission: RE | Admit: 2024-12-11 | Discharge: 2024-12-11 | Disposition: A | Source: Ambulatory Visit | Attending: Nephrology

## 2024-12-11 VITALS — BP 172/63 | HR 62 | Temp 97.0°F | Resp 16

## 2024-12-11 DIAGNOSIS — N184 Chronic kidney disease, stage 4 (severe): Secondary | ICD-10-CM | POA: Insufficient documentation

## 2024-12-11 DIAGNOSIS — D631 Anemia in chronic kidney disease: Secondary | ICD-10-CM | POA: Diagnosis present

## 2024-12-11 LAB — IRON AND TIBC
Iron: 77 ug/dL (ref 28–170)
Saturation Ratios: 27 % (ref 10.4–31.8)
TIBC: 291 ug/dL (ref 250–450)
UIBC: 214 ug/dL

## 2024-12-11 LAB — POCT HEMOGLOBIN-HEMACUE: Hemoglobin: 11.3 g/dL — ABNORMAL LOW (ref 12.0–15.0)

## 2024-12-11 LAB — FERRITIN: Ferritin: 476 ng/mL — ABNORMAL HIGH (ref 11–307)

## 2024-12-11 MED ORDER — EPOETIN ALFA-EPBX 10000 UNIT/ML IJ SOLN
INTRAMUSCULAR | Status: AC
  Filled 2024-12-11: qty 1

## 2024-12-11 MED ORDER — EPOETIN ALFA-EPBX 10000 UNIT/ML IJ SOLN
10000.0000 [IU] | Freq: Once | INTRAMUSCULAR | Status: AC
  Administered 2024-12-11: 10000 [IU] via SUBCUTANEOUS

## 2024-12-25 ENCOUNTER — Encounter (HOSPITAL_COMMUNITY)
Admission: RE | Admit: 2024-12-25 | Discharge: 2024-12-25 | Disposition: A | Source: Ambulatory Visit | Attending: Nephrology

## 2024-12-25 ENCOUNTER — Other Ambulatory Visit: Payer: Self-pay | Admitting: Hematology

## 2024-12-25 VITALS — BP 161/75 | HR 71 | Temp 97.9°F | Resp 16

## 2024-12-25 DIAGNOSIS — N184 Chronic kidney disease, stage 4 (severe): Secondary | ICD-10-CM | POA: Diagnosis not present

## 2024-12-25 DIAGNOSIS — D631 Anemia in chronic kidney disease: Secondary | ICD-10-CM

## 2024-12-25 LAB — POCT HEMOGLOBIN-HEMACUE: Hemoglobin: 11.7 g/dL — ABNORMAL LOW (ref 12.0–15.0)

## 2024-12-25 MED ORDER — EPOETIN ALFA-EPBX 10000 UNIT/ML IJ SOLN
10000.0000 [IU] | Freq: Once | INTRAMUSCULAR | Status: AC
  Administered 2024-12-25: 10000 [IU] via SUBCUTANEOUS

## 2024-12-25 MED ORDER — EPOETIN ALFA-EPBX 10000 UNIT/ML IJ SOLN
INTRAMUSCULAR | Status: AC
  Filled 2024-12-25: qty 1

## 2025-01-08 ENCOUNTER — Encounter (HOSPITAL_COMMUNITY)

## 2025-01-22 ENCOUNTER — Encounter (HOSPITAL_COMMUNITY)
